# Patient Record
Sex: Male | Born: 1955 | Race: White | Hispanic: No | Marital: Married | Smoking: Former smoker
Health system: Southern US, Community
[De-identification: ages and names within clinical notes are randomized; demographics above are authoritative.]

## PROBLEM LIST (undated history)

## (undated) DIAGNOSIS — D500 Iron deficiency anemia secondary to blood loss (chronic): Secondary | ICD-10-CM

## (undated) DIAGNOSIS — D126 Benign neoplasm of colon, unspecified: Secondary | ICD-10-CM

## (undated) DIAGNOSIS — K259 Gastric ulcer, unspecified as acute or chronic, without hemorrhage or perforation: Secondary | ICD-10-CM

## (undated) DIAGNOSIS — E039 Hypothyroidism, unspecified: Secondary | ICD-10-CM

## (undated) DIAGNOSIS — I10 Essential (primary) hypertension: Secondary | ICD-10-CM

## (undated) DIAGNOSIS — Z972 Presence of dental prosthetic device (complete) (partial): Secondary | ICD-10-CM

## (undated) DIAGNOSIS — Z7189 Other specified counseling: Secondary | ICD-10-CM

## (undated) DIAGNOSIS — D649 Anemia, unspecified: Secondary | ICD-10-CM

## (undated) DIAGNOSIS — F419 Anxiety disorder, unspecified: Secondary | ICD-10-CM

## (undated) DIAGNOSIS — K219 Gastro-esophageal reflux disease without esophagitis: Secondary | ICD-10-CM

## (undated) DIAGNOSIS — Z973 Presence of spectacles and contact lenses: Secondary | ICD-10-CM

## (undated) DIAGNOSIS — K5790 Diverticulosis of intestine, part unspecified, without perforation or abscess without bleeding: Secondary | ICD-10-CM

## (undated) DIAGNOSIS — N200 Calculus of kidney: Secondary | ICD-10-CM

## (undated) DIAGNOSIS — K602 Anal fissure, unspecified: Secondary | ICD-10-CM

## (undated) DIAGNOSIS — Z974 Presence of external hearing-aid: Secondary | ICD-10-CM

## (undated) DIAGNOSIS — K5900 Constipation, unspecified: Secondary | ICD-10-CM

## (undated) DIAGNOSIS — F32A Depression, unspecified: Secondary | ICD-10-CM

## (undated) DIAGNOSIS — M1990 Unspecified osteoarthritis, unspecified site: Secondary | ICD-10-CM

## (undated) DIAGNOSIS — F329 Major depressive disorder, single episode, unspecified: Secondary | ICD-10-CM

## (undated) DIAGNOSIS — E785 Hyperlipidemia, unspecified: Secondary | ICD-10-CM

## (undated) DIAGNOSIS — H9190 Unspecified hearing loss, unspecified ear: Secondary | ICD-10-CM

## (undated) DIAGNOSIS — C165 Malignant neoplasm of lesser curvature of stomach, unspecified: Secondary | ICD-10-CM

## (undated) HISTORY — DX: Hypothyroidism, unspecified: E03.9

## (undated) HISTORY — DX: Other specified counseling: Z71.89

## (undated) HISTORY — PX: HERNIA REPAIR: SHX51

## (undated) HISTORY — DX: Anxiety disorder, unspecified: F41.9

## (undated) HISTORY — DX: Benign neoplasm of colon, unspecified: D12.6

## (undated) HISTORY — DX: Essential (primary) hypertension: I10

## (undated) HISTORY — DX: Unspecified osteoarthritis, unspecified site: M19.90

## (undated) HISTORY — DX: Calculus of kidney: N20.0

## (undated) HISTORY — DX: Depression, unspecified: F32.A

## (undated) HISTORY — DX: Diverticulosis of intestine, part unspecified, without perforation or abscess without bleeding: K57.90

## (undated) HISTORY — DX: Anemia, unspecified: D64.9

## (undated) HISTORY — DX: Gastro-esophageal reflux disease without esophagitis: K21.9

## (undated) HISTORY — DX: Constipation, unspecified: K59.00

## (undated) HISTORY — DX: Major depressive disorder, single episode, unspecified: F32.9

## (undated) HISTORY — DX: Gastric ulcer, unspecified as acute or chronic, without hemorrhage or perforation: K25.9

## (undated) HISTORY — DX: Hyperlipidemia, unspecified: E78.5

## (undated) HISTORY — DX: Anal fissure, unspecified: K60.2

## (undated) HISTORY — DX: Iron deficiency anemia secondary to blood loss (chronic): D50.0

## (undated) HISTORY — PX: COLONOSCOPY W/ BIOPSIES AND POLYPECTOMY: SHX1376

## (undated) HISTORY — DX: Malignant neoplasm of lesser curvature of stomach, unspecified: C16.5

---

## 1982-03-05 HISTORY — PX: APPENDECTOMY: SHX54

## 1999-11-22 ENCOUNTER — Ambulatory Visit (HOSPITAL_BASED_OUTPATIENT_CLINIC_OR_DEPARTMENT_OTHER): Admission: RE | Admit: 1999-11-22 | Discharge: 1999-11-22 | Payer: Self-pay | Admitting: Orthopedic Surgery

## 2004-02-09 ENCOUNTER — Encounter: Admission: RE | Admit: 2004-02-09 | Discharge: 2004-02-09 | Payer: Self-pay | Admitting: Gastroenterology

## 2006-02-04 ENCOUNTER — Encounter: Payer: Self-pay | Admitting: Gastroenterology

## 2006-02-05 ENCOUNTER — Encounter: Payer: Self-pay | Admitting: Gastroenterology

## 2006-03-05 HISTORY — PX: THUMB FUSION: SUR636

## 2006-03-05 HISTORY — PX: TRIGGER FINGER RELEASE: SHX641

## 2007-10-31 ENCOUNTER — Encounter: Admission: RE | Admit: 2007-10-31 | Discharge: 2007-10-31 | Payer: Self-pay | Admitting: General Surgery

## 2007-11-03 ENCOUNTER — Encounter (INDEPENDENT_AMBULATORY_CARE_PROVIDER_SITE_OTHER): Payer: Self-pay | Admitting: General Surgery

## 2007-11-03 ENCOUNTER — Ambulatory Visit (HOSPITAL_BASED_OUTPATIENT_CLINIC_OR_DEPARTMENT_OTHER): Admission: RE | Admit: 2007-11-03 | Discharge: 2007-11-03 | Payer: Self-pay | Admitting: General Surgery

## 2008-03-05 HISTORY — PX: ANAL FISSURE REPAIR: SHX2312

## 2008-03-11 ENCOUNTER — Ambulatory Visit: Payer: Self-pay | Admitting: Gastroenterology

## 2008-03-11 DIAGNOSIS — E785 Hyperlipidemia, unspecified: Secondary | ICD-10-CM | POA: Insufficient documentation

## 2008-03-11 DIAGNOSIS — N200 Calculus of kidney: Secondary | ICD-10-CM | POA: Insufficient documentation

## 2008-03-11 DIAGNOSIS — F329 Major depressive disorder, single episode, unspecified: Secondary | ICD-10-CM

## 2008-03-11 DIAGNOSIS — D126 Benign neoplasm of colon, unspecified: Secondary | ICD-10-CM | POA: Insufficient documentation

## 2008-03-11 DIAGNOSIS — D649 Anemia, unspecified: Secondary | ICD-10-CM

## 2008-03-11 DIAGNOSIS — N189 Chronic kidney disease, unspecified: Secondary | ICD-10-CM | POA: Insufficient documentation

## 2008-03-11 DIAGNOSIS — R079 Chest pain, unspecified: Secondary | ICD-10-CM | POA: Insufficient documentation

## 2008-03-11 DIAGNOSIS — I10 Essential (primary) hypertension: Secondary | ICD-10-CM | POA: Insufficient documentation

## 2008-03-11 DIAGNOSIS — K5730 Diverticulosis of large intestine without perforation or abscess without bleeding: Secondary | ICD-10-CM | POA: Insufficient documentation

## 2008-03-11 DIAGNOSIS — K602 Anal fissure, unspecified: Secondary | ICD-10-CM | POA: Insufficient documentation

## 2008-03-11 DIAGNOSIS — Z8739 Personal history of other diseases of the musculoskeletal system and connective tissue: Secondary | ICD-10-CM | POA: Insufficient documentation

## 2008-03-11 DIAGNOSIS — F411 Generalized anxiety disorder: Secondary | ICD-10-CM | POA: Insufficient documentation

## 2008-03-11 HISTORY — DX: Anemia, unspecified: D64.9

## 2008-03-16 ENCOUNTER — Ambulatory Visit (HOSPITAL_COMMUNITY): Admission: RE | Admit: 2008-03-16 | Discharge: 2008-03-16 | Payer: Self-pay | Admitting: Gastroenterology

## 2008-03-17 ENCOUNTER — Ambulatory Visit: Payer: Self-pay | Admitting: Gastroenterology

## 2008-03-17 ENCOUNTER — Encounter: Payer: Self-pay | Admitting: Gastroenterology

## 2008-03-17 LAB — CONVERTED CEMR LAB: UREASE: NEGATIVE

## 2008-03-18 LAB — CONVERTED CEMR LAB
ALT: 18 units/L (ref 0–53)
AST: 19 units/L (ref 0–37)
Albumin: 4.5 g/dL (ref 3.5–5.2)
Alkaline Phosphatase: 50 units/L (ref 39–117)
Amylase: 62 units/L (ref 27–131)
BUN: 28 mg/dL — ABNORMAL HIGH (ref 6–23)
Basophils Absolute: 0 10*3/uL (ref 0.0–0.1)
Basophils Relative: 1.2 % (ref 0.0–3.0)
Bilirubin, Direct: 0.1 mg/dL (ref 0.0–0.3)
CO2: 26 meq/L (ref 19–32)
Calcium: 10 mg/dL (ref 8.4–10.5)
Chloride: 104 meq/L (ref 96–112)
Creatinine, Ser: 1.4 mg/dL (ref 0.4–1.5)
Eosinophils Absolute: 0.1 10*3/uL (ref 0.0–0.7)
Eosinophils Relative: 3.6 % (ref 0.0–5.0)
GFR calc Af Amer: 68 mL/min
GFR calc non Af Amer: 56 mL/min
Glucose, Bld: 114 mg/dL — ABNORMAL HIGH (ref 70–99)
HCT: 35.1 % — ABNORMAL LOW (ref 39.0–52.0)
Hemoglobin: 12 g/dL — ABNORMAL LOW (ref 13.0–17.0)
Lipase: 37 units/L (ref 11.0–59.0)
Lymphocytes Relative: 41.2 % (ref 12.0–46.0)
MCHC: 34.3 g/dL (ref 30.0–36.0)
MCV: 87.3 fL (ref 78.0–100.0)
Monocytes Absolute: 0.3 10*3/uL (ref 0.1–1.0)
Monocytes Relative: 12.1 % — ABNORMAL HIGH (ref 3.0–12.0)
Neutro Abs: 1.2 10*3/uL — ABNORMAL LOW (ref 1.4–7.7)
Neutrophils Relative %: 41.9 % — ABNORMAL LOW (ref 43.0–77.0)
Platelets: 298 10*3/uL (ref 150–400)
Potassium: 4.5 meq/L (ref 3.5–5.1)
RBC: 4.02 M/uL — ABNORMAL LOW (ref 4.22–5.81)
RDW: 13.1 % (ref 11.5–14.6)
Sed Rate: 27 mm/hr — ABNORMAL HIGH (ref 0–16)
Sodium: 138 meq/L (ref 135–145)
TSH: 4.26 microintl units/mL (ref 0.35–5.50)
Total Bilirubin: 0.7 mg/dL (ref 0.3–1.2)
Total Protein: 8.1 g/dL (ref 6.0–8.3)
WBC: 2.8 10*3/uL — ABNORMAL LOW (ref 4.5–10.5)

## 2008-03-19 ENCOUNTER — Encounter: Payer: Self-pay | Admitting: Gastroenterology

## 2008-04-19 ENCOUNTER — Ambulatory Visit: Payer: Self-pay | Admitting: Gastroenterology

## 2008-04-19 LAB — CONVERTED CEMR LAB
Basophils Absolute: 0 10*3/uL (ref 0.0–0.1)
Basophils Relative: 1 % (ref 0.0–3.0)
Eosinophils Absolute: 0.1 10*3/uL (ref 0.0–0.7)
Eosinophils Relative: 3.3 % (ref 0.0–5.0)
Ferritin: 89.7 ng/mL (ref 22.0–322.0)
Folate: 12 ng/mL
HCT: 34.6 % — ABNORMAL LOW (ref 39.0–52.0)
Hemoglobin: 11.9 g/dL — ABNORMAL LOW (ref 13.0–17.0)
Iron: 72 ug/dL (ref 42–165)
Lymphocytes Relative: 38.5 % (ref 12.0–46.0)
MCHC: 34.4 g/dL (ref 30.0–36.0)
MCV: 88.2 fL (ref 78.0–100.0)
Monocytes Absolute: 0.4 10*3/uL (ref 0.1–1.0)
Monocytes Relative: 13.5 % — ABNORMAL HIGH (ref 3.0–12.0)
Neutro Abs: 1.3 10*3/uL — ABNORMAL LOW (ref 1.4–7.7)
Neutrophils Relative %: 43.7 % (ref 43.0–77.0)
Platelets: 318 10*3/uL (ref 150–400)
RBC: 3.93 M/uL — ABNORMAL LOW (ref 4.22–5.81)
RDW: 12.9 % (ref 11.5–14.6)
Saturation Ratios: 17.6 % — ABNORMAL LOW (ref 20.0–50.0)
Transferrin: 291.8 mg/dL (ref >=212.0)
Vitamin B-12: 723 pg/mL (ref 211–911)
WBC: 3 10*3/uL — ABNORMAL LOW (ref 4.5–10.5)

## 2008-05-17 ENCOUNTER — Ambulatory Visit: Payer: Self-pay | Admitting: Gastroenterology

## 2008-05-17 LAB — CONVERTED CEMR LAB
Basophils Absolute: 0.1 10*3/uL (ref 0.0–0.1)
Basophils Relative: 2.1 % (ref 0.0–3.0)
Eosinophils Absolute: 0.2 10*3/uL (ref 0.0–0.7)
Eosinophils Relative: 4.2 % (ref 0.0–5.0)
HCT: 34.1 % — ABNORMAL LOW (ref 39.0–52.0)
Hemoglobin: 11.4 g/dL — ABNORMAL LOW (ref 13.0–17.0)
Iron: 51 ug/dL (ref 42–165)
Lymphocytes Relative: 37.4 % (ref 12.0–46.0)
MCHC: 33.5 g/dL (ref 30.0–36.0)
MCV: 88 fL (ref 78.0–100.0)
Monocytes Absolute: 0.6 10*3/uL (ref 0.1–1.0)
Monocytes Relative: 15.1 % — ABNORMAL HIGH (ref 3.0–12.0)
Neutro Abs: 1.4 10*3/uL (ref 1.4–7.7)
Neutrophils Relative %: 41.2 % — ABNORMAL LOW (ref 43.0–77.0)
Platelets: 289 10*3/uL (ref 150–400)
RBC: 3.87 M/uL — ABNORMAL LOW (ref 4.22–5.81)
RDW: 13.3 % (ref 11.5–14.6)
Saturation Ratios: 12.6 % — ABNORMAL LOW (ref 20.0–50.0)
Transferrin: 289.8 mg/dL (ref 212.0–360.0)
WBC: 3.7 10*3/uL — ABNORMAL LOW (ref 4.5–10.5)

## 2008-05-24 ENCOUNTER — Telehealth: Payer: Self-pay | Admitting: Gastroenterology

## 2008-06-01 ENCOUNTER — Ambulatory Visit: Payer: Self-pay | Admitting: Gastroenterology

## 2008-06-01 LAB — CONVERTED CEMR LAB: Fecal Occult Bld: POSITIVE

## 2008-06-07 ENCOUNTER — Ambulatory Visit: Payer: Self-pay | Admitting: Gastroenterology

## 2008-06-16 ENCOUNTER — Ambulatory Visit: Payer: Self-pay | Admitting: Gastroenterology

## 2008-06-29 ENCOUNTER — Ambulatory Visit: Payer: Self-pay | Admitting: Gastroenterology

## 2008-06-29 DIAGNOSIS — K2940 Chronic atrophic gastritis without bleeding: Secondary | ICD-10-CM | POA: Insufficient documentation

## 2008-06-29 LAB — CONVERTED CEMR LAB
Basophils Absolute: 0 10*3/uL (ref 0.0–0.1)
Basophils Relative: 0.9 % (ref 0.0–3.0)
Eosinophils Absolute: 0.1 10*3/uL (ref 0.0–0.7)
Eosinophils Relative: 4 % (ref 0.0–5.0)
Ferritin: 56.4 ng/mL (ref 22.0–322.0)
Folate: >20 ng/mL
HCT: 31.3 % — ABNORMAL LOW (ref 39.0–52.0)
Hemoglobin: 10.8 g/dL — ABNORMAL LOW (ref 13.0–17.0)
Iron: 66 ug/dL (ref 42–165)
Lymphocytes Relative: 38.8 % (ref 12.0–46.0)
Lymphs Abs: 1.3 10*3/uL (ref 0.7–4.0)
MCHC: 34.5 g/dL (ref 30.0–36.0)
MCV: 88 fL (ref 78.0–100.0)
Monocytes Absolute: 0.5 10*3/uL (ref 0.1–1.0)
Monocytes Relative: 15.9 % — ABNORMAL HIGH (ref 3.0–12.0)
Neutro Abs: 1.5 10*3/uL (ref 1.4–7.7)
Neutrophils Relative %: 40.4 % — ABNORMAL LOW (ref 43.0–77.0)
Platelets: 308 10*3/uL (ref 150.0–400.0)
RBC: 3.56 M/uL — ABNORMAL LOW (ref 4.22–5.81)
RDW: 13.3 % (ref 11.5–14.6)
Saturation Ratios: 19.3 % — ABNORMAL LOW (ref 20.0–50.0)
Transferrin: 244.5 mg/dL (ref 212.0–360.0)
Vitamin B-12: >1500 pg/mL — ABNORMAL HIGH (ref 211–911)
WBC: 3.4 10*3/uL — ABNORMAL LOW (ref 4.5–10.5)

## 2008-07-07 ENCOUNTER — Ambulatory Visit: Payer: Self-pay | Admitting: Gastroenterology

## 2008-07-21 ENCOUNTER — Telehealth (INDEPENDENT_AMBULATORY_CARE_PROVIDER_SITE_OTHER): Payer: Self-pay | Admitting: *Deleted

## 2008-07-30 ENCOUNTER — Ambulatory Visit: Payer: Self-pay | Admitting: Gastroenterology

## 2008-08-03 ENCOUNTER — Telehealth: Payer: Self-pay | Admitting: Gastroenterology

## 2008-08-12 ENCOUNTER — Encounter: Payer: Self-pay | Admitting: Gastroenterology

## 2008-08-12 ENCOUNTER — Ambulatory Visit: Payer: Self-pay | Admitting: Internal Medicine

## 2008-08-24 ENCOUNTER — Telehealth: Payer: Self-pay | Admitting: Gastroenterology

## 2008-08-30 ENCOUNTER — Telehealth: Payer: Self-pay | Admitting: Gastroenterology

## 2008-08-31 ENCOUNTER — Telehealth: Payer: Self-pay | Admitting: Gastroenterology

## 2008-09-28 ENCOUNTER — Ambulatory Visit: Payer: Self-pay | Admitting: Gastroenterology

## 2008-09-28 ENCOUNTER — Encounter: Admission: RE | Admit: 2008-09-28 | Discharge: 2008-09-28 | Payer: Self-pay | Admitting: Family Medicine

## 2008-10-04 LAB — CONVERTED CEMR LAB
Basophils Absolute: 0.1 10*3/uL
Basophils Relative: 2.3 %
Eosinophils Absolute: 0.1 10*3/uL
Eosinophils Relative: 2.8 %
HCT: 35.1 % — ABNORMAL LOW
Hemoglobin: 11.9 g/dL — ABNORMAL LOW
Lymphocytes Relative: 36 %
Lymphs Abs: 1.3 10*3/uL
MCHC: 33.9 g/dL
MCV: 87.1 fL
Monocytes Absolute: 0.4 10*3/uL
Monocytes Relative: 11.9 %
Neutro Abs: 1.6 10*3/uL
Neutrophils Relative %: 47 %
Platelets: 288 10*3/uL
RBC: 4.03 M/uL — ABNORMAL LOW
RDW: 13.2 %
WBC: 3.5 10*3/uL — ABNORMAL LOW

## 2009-10-06 ENCOUNTER — Telehealth (INDEPENDENT_AMBULATORY_CARE_PROVIDER_SITE_OTHER): Payer: Self-pay | Admitting: *Deleted

## 2010-04-04 NOTE — Progress Notes (Signed)
  Phone Note Other Incoming   Request: Send information Action Taken: Software engineer of Call: Request for records received from [employer]. Request forwarded to [employer].

## 2010-07-18 NOTE — Op Note (Signed)
NAME:  Steven Ortega, Steven Ortega NO.:  [id number]   MEDICAL RECORD NO.:  [id number]          PATIENT TYPE:  AMB   LOCATION:  DSC                          FACILITY:  [facility]   PHYSICIAN:  Cherylynn Ridges, M.D.    DATE OF BIRTH:  12-11-1955   DATE OF PROCEDURE:  11/03/2007  DATE OF DISCHARGE:                               OPERATIVE REPORT   PREOPERATIVE DIAGNOSIS:  Anal fissure with sentinel tag.   POSTOPERATIVE DIAGNOSIS:  Anal fissure with sentinel tag.   PROCEDURE:  1. Examination under anesthesia.  2. Hemorrhoidectomy.  3. Fissurectomy.  4. Right lateral sphincterotomy.   SURGEON:  Cherylynn Ridges, MD   ANESTHESIA:  General with a laryngeal airway.   ESTIMATED BLOOD LOSS:  Less than 50 mL.   COMPLICATIONS:  None.   CONDITION:  Stable.   INDICATIONS FOR OPERATION:  The patient is a 55 year old with pain,  bleeding from a known sentinel tag, and fissure, now comes in for EUA  and fissurectomy operation.   FINDINGS AT THE TIME OF SURGERY:  The patient is found to have a left  lateral anal fissure associated with a sentinel tag and a hemorrhoid in  internal position at approximately the 4 o'clock position with 12  o'clock being directly anterior in the lithotomy position.   OPERATION:  The patient was taken to the operating room and placed on  table in supine position.  After an adequate general laryngeal airway  anesthetic was administered, he was actually placed on lithotomy  position and then prepped and draped in usual sterile manner exposing  the perianal and anal area.   Using an anal speculum, the area of the left lateral sentinel tag and  fissure was identified.  Just above the fissure was a large hemorrhoid.  At the base of the hemorrhoid, a 3-0 chromic stitch was placed using SH-  1 needle.  We then excised the hemorrhoid, the mucosa just outside of  the fissure and also the sentinel tag in one-piece and closed up the  area using running locking stitch  with 3-0 chromic.  Once this was done,  we used a second stitch in order to control bleeding at the level of the  internal hemorrhoid.  A figure-of-eight stitch of 3-0 chromic was done.  There is a posterior tear of the mucosa through which we used in order  to access the sphincter on the right lateral side.  We  accessed the sphincter muscle, got underneath with a Kelly clamp, and  then cut across using electrocautery.  We then reapproximated the mucosa  in the midline posteriorly using a running locking stitch of 3-0  chromic.  Dibucaine ointment with Gelfoam was placed in the wound.  There was minimal bleeding.  All counts were correct.      Cherylynn Ridges, M.D.  Electronically Signed     JOW/MEDQ  D:  11/03/2007  T:  11/04/2007  Job:  [id number]

## 2011-03-06 HISTORY — PX: LUMBAR FUSION: SHX111

## 2013-07-29 ENCOUNTER — Encounter: Payer: Self-pay | Admitting: Internal Medicine

## 2013-08-05 ENCOUNTER — Encounter: Payer: Self-pay | Admitting: Internal Medicine

## 2013-10-08 ENCOUNTER — Telehealth: Payer: Self-pay | Admitting: *Deleted

## 2013-10-08 ENCOUNTER — Ambulatory Visit (AMBULATORY_SURGERY_CENTER): Payer: Medicare Other | Admitting: *Deleted

## 2013-10-08 VITALS — Ht 72.0 in | Wt 204.0 lb

## 2013-10-08 DIAGNOSIS — Z86010 Personal history of colonic polyps: Secondary | ICD-10-CM

## 2013-10-08 MED ORDER — NA SULFATE-K SULFATE-MG SULF 17.5-3.13-1.6 GM/177ML PO SOLN: ORAL | Status: DC

## 2013-10-08 NOTE — Telephone Encounter (Signed)
noted 

## 2013-10-08 NOTE — Telephone Encounter (Signed)
We can let Steven Ortega decide if we ned to postpone the colonoscopy

## 2013-10-08 NOTE — Progress Notes (Signed)
No allergies to eggs or soy. No problems with anesthesia.  Pt given Emmi instructions for colonoscopy  No oxygen use  No diet drug use  During PV pt says treated for diverticulitis late June.  Symptoms have returned and pt requesting OV.  New pt appt scheduled with Alonza Bogus 8/11.  Colonoscopy not cancelled.  Phone note sent to Dr. Carlean Purl.

## 2013-10-08 NOTE — Telephone Encounter (Signed)
Dr Carlean Purl; pt is scheduled for recall colonoscopy 10/22/13.  During PV pt says he was treated for diverticulitis in late June by MD at [facility] (pt does not know name of MD).  He says symptoms have returned and he is requesting OV.  I have scheduled him for New Pt appt with Alonza Bogus on Tuesday 8/11 and have not cancelled colonoscopy scheduled for 8/20.  Please let me know if I need to make changes.  Thanks, Juliann Pulse

## 2013-10-13 ENCOUNTER — Encounter: Payer: Self-pay | Admitting: Gastroenterology

## 2013-10-13 ENCOUNTER — Other Ambulatory Visit (INDEPENDENT_AMBULATORY_CARE_PROVIDER_SITE_OTHER): Payer: Medicare Other

## 2013-10-13 ENCOUNTER — Ambulatory Visit (INDEPENDENT_AMBULATORY_CARE_PROVIDER_SITE_OTHER): Payer: Medicare Other | Admitting: Gastroenterology

## 2013-10-13 VITALS — BP 110/74 | HR 96 | Ht 71.0 in | Wt 207.0 lb

## 2013-10-13 DIAGNOSIS — R1032 Left lower quadrant pain: Secondary | ICD-10-CM

## 2013-10-13 DIAGNOSIS — K5732 Diverticulitis of large intestine without perforation or abscess without bleeding: Secondary | ICD-10-CM

## 2013-10-13 LAB — BASIC METABOLIC PANEL
BUN: 20 mg/dL (ref 6–23)
CHLORIDE: 103 meq/L (ref 96–112)
CO2: 22 meq/L (ref 19–32)
Calcium: 10.1 mg/dL (ref 8.4–10.5)
Creatinine, Ser: 1.5 mg/dL (ref 0.4–1.5)
GFR: 51.13 mL/min — ABNORMAL LOW (ref >60.00)
Glucose, Bld: 100 mg/dL — ABNORMAL HIGH (ref 70–99)
POTASSIUM: 4 meq/L (ref 3.5–5.1)
Sodium: 136 mEq/L (ref 135–145)

## 2013-10-13 NOTE — Progress Notes (Signed)
     10/13/2013 Steven Ortega [id number] 01-Jan-1956   History of Present Illness:  This is a 58 year old male who is previously known to Dr. Sharlett Iles.  Had colonoscopy in 06/2008 at which time he had moderate diverticulosis but was otherwise normal.  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  He is scheduled for a colonoscopy on 8/20 with Dr. Carlean Purl for surveillance.  He has a history of diverticulitis.  Was treated for presumed diverticulitis in June by his PCP when he complained of left sided abdominal pain.  He says that the pain has improved, but never really went away.  Pain is on the left side (left mid-abdomen to LLQ).  Denies any nausea, vomiting, fevers, or chills.  Appetite is good.  Takes Miralax and stool softeners for constipation.   Current Medications, Allergies, Past Medical History, Past Surgical History, Family History and Social History were reviewed in [employer] record.   Physical Exam: BP 110/74  Pulse 96  Ht 5\' 11"  (1.803 m)  Wt 207 lb (93.895 kg)  BMI 28.88 kg/m2 General: Well developed white male in no acute distress Head: Normocephalic and atraumatic Eyes:  Sclerae anicteric, conjunctiva pink  Ears: Normal auditory acuity Lungs: Clear throughout to auscultation Heart: Regular rate and rhythm Abdomen: Soft, non-distended.  Normal bowel sounds.  Mild LLQ abdominal TTP without R/R/G. Rectal:  Deferred.  Will be done at the time of colonoscopy. Musculoskeletal: Symmetrical with no gross deformities  Extremities: No edema  Neurological: Alert oriented x 4, grossly non-focal Psychological:  Alert and cooperative. Normal mood and affect  Assessment and Recommendations: -Left-sided abdominal pain with history of diverticulitis:  Will schedule CT scan of the abdomen and pelvis with contrast.  Check BMP today.  If CT scan ok and negative for diverticulitis then will proceed with colonoscopy next week as already  scheduled.

## 2013-10-13 NOTE — Patient Instructions (Signed)
Your physician has requested that you go to the basement for the following lab work before leaving today: BMET __________________________________________________________________________________________________ Steven Ortega have been scheduled for a CT scan of the abdomen and pelvis at [facility] ([address]---this is in the same building as Press [facility]).   You are scheduled on 10-15-2013 at 230 PM. You should arrive 15 minutes prior to your appointment time for registration. Please follow the written instructions below on the day of your exam:  WARNING: IF YOU ARE ALLERGIC TO IODINE/X-RAY DYE, PLEASE NOTIFY RADIOLOGY IMMEDIATELY AT [phone]! YOU WILL BE GIVEN A 13 HOUR PREMEDICATION PREP.  1) Do not eat or drink anything after 1030 AM (4 hours prior to your test) 2) You have been given 2 bottles of oral contrast to drink. The solution may taste better if refrigerated, but do NOT add ice or any other liquid to this solution. Shake well before drinking.    Drink 1 bottle of contrast @ 1230 PM (2 hours prior to your exam)  Drink 1 bottle of contrast @ 1:30 PM (1 hour prior to your exam)  You may take any medications as prescribed with a small amount of water except for the following: Metformin, Glucophage, Glucovance, Avandamet, Riomet, Fortamet, Actoplus Met, Janumet, Glumetza or Metaglip. The above medications must be held the day of the exam AND 48 hours after the exam.  The purpose of you drinking the oral contrast is to aid in the visualization of your intestinal tract. The contrast solution may cause some diarrhea. Before your exam is started, you will be given a small amount of fluid to drink. Depending on your individual set of symptoms, you may also receive an intravenous injection of x-ray contrast/dye. Plan on being at [facility] for 30 minutes or long, depending on the type of exam you are having performed.  This test typically takes 30-45 minutes to  complete.  If you have any questions regarding your exam or if you need to reschedule, you may call the CT department at [phone] between the hours of 8:00 am and 5:00 pm, Monday-Friday.  ________________________________________________________________________

## 2013-10-14 NOTE — Progress Notes (Signed)
Agree with Ms. Zehr's management.  Carl E. Gessner, MD, FACG  

## 2013-10-15 ENCOUNTER — Ambulatory Visit (INDEPENDENT_AMBULATORY_CARE_PROVIDER_SITE_OTHER)
Admission: RE | Admit: 2013-10-15 | Discharge: 2013-10-15 | Disposition: A | Discharge status: Home / Self Care | Payer: Medicare Other | Source: Ambulatory Visit | Attending: Gastroenterology | Admitting: Gastroenterology

## 2013-10-15 DIAGNOSIS — R1032 Left lower quadrant pain: Secondary | ICD-10-CM

## 2013-10-15 DIAGNOSIS — K5732 Diverticulitis of large intestine without perforation or abscess without bleeding: Secondary | ICD-10-CM

## 2013-10-15 MED ORDER — IOHEXOL 300 MG/ML  SOLN
100.0000 mL | Freq: Once | INTRAMUSCULAR | Status: AC | PRN
  Administered 2013-10-15: 100 mL via INTRAVENOUS

## 2013-10-22 ENCOUNTER — Ambulatory Visit (AMBULATORY_SURGERY_CENTER): Payer: Medicare Other | Admitting: Internal Medicine

## 2013-10-22 ENCOUNTER — Encounter: Payer: Self-pay | Admitting: Internal Medicine

## 2013-10-22 VITALS — BP 112/74 | HR 53 | Temp 97.8°F | Resp 15 | Ht 71.0 in | Wt 207.0 lb

## 2013-10-22 DIAGNOSIS — Z86010 Personal history of colonic polyps: Secondary | ICD-10-CM

## 2013-10-22 DIAGNOSIS — K5730 Diverticulosis of large intestine without perforation or abscess without bleeding: Secondary | ICD-10-CM

## 2013-10-22 DIAGNOSIS — R1032 Left lower quadrant pain: Secondary | ICD-10-CM

## 2013-10-22 MED ORDER — SODIUM CHLORIDE 0.9 % IV SOLN: 500.0000 mL | INTRAVENOUS | Status: DC

## 2013-10-22 NOTE — Patient Instructions (Addendum)
No polyps today!  You do have  Diverticulosis but no diverticulitis now. Please read the handout provided.  Next routine colonoscopy in 10 years - 2025  I appreciate the opportunity to care for you. Gatha Mayer, MD, FACG  YOU HAD AN ENDOSCOPIC PROCEDURE TODAY AT [facility]: Refer to the procedure report that was given to you for any specific questions about what was found during the examination.  If the procedure report does not answer your questions, please call your gastroenterologist to clarify.  If you requested that your care partner not be given the details of your procedure findings, then the procedure report has been included in a sealed envelope for you to review at your convenience later.  YOU SHOULD EXPECT: Some feelings of bloating in the abdomen. Passage of more gas than usual.  Walking can help get rid of the air that was put into your GI tract during the procedure and reduce the bloating. If you had a lower endoscopy (such as a colonoscopy or flexible sigmoidoscopy) you may notice spotting of blood in your stool or on the toilet paper. If you underwent a bowel prep for your procedure, then you may not have a normal bowel movement for a few days.  DIET: Your first meal following the procedure should be a light meal and then it is ok to progress to your normal diet.  A half-sandwich or bowl of soup is an example of a good first meal.  Heavy or fried foods are harder to digest and may make you feel nauseous or bloated.  Likewise meals heavy in dairy and vegetables can cause extra gas to form and this can also increase the bloating.  Drink plenty of fluids but you should avoid alcoholic beverages for 24 hours.  ACTIVITY: Your care partner should take you home directly after the procedure.  You should plan to take it easy, moving slowly for the rest of the day.  You can resume normal activity the day after the procedure however you should NOT DRIVE or use heavy  machinery for 24 hours (because of the sedation medicines used during the test).    SYMPTOMS TO REPORT IMMEDIATELY: A gastroenterologist can be reached at any hour.  During normal business hours, 8:30 AM to 5:00 PM Monday through Friday, call [phone].  After hours and on weekends, please call the GI answering service at [phone] who will take a message and have the physician on call contact you.   Following lower endoscopy (colonoscopy or flexible sigmoidoscopy):  Excessive amounts of blood in the stool  Significant tenderness or worsening of abdominal pains  Swelling of the abdomen that is new, acute  Fever of 100F or higher   FOLLOW UP: If any biopsies were taken you will be contacted by phone or by letter within the next 1-3 weeks.  Call your gastroenterologist if you have not heard about the biopsies in 3 weeks.  Our staff will call the home number listed on your records the next business day following your procedure to check on you and address any questions or concerns that you may have at that time regarding the information given to you following your procedure. This is a courtesy call and so if there is no answer at the home number and we have not heard from you through the emergency physician on call, we will assume that you have returned to your regular daily activities without incident.  SIGNATURES/CONFIDENTIALITY: You and/or your care partner  have signed paperwork which will be entered into your electronic medical record.  These signatures attest to the fact that that the information above on your After Visit Summary has been reviewed and is understood.  Full responsibility of the confidentiality of this discharge information lies with you and/or your care-partner.

## 2013-10-22 NOTE — Op Note (Signed)
[place]  [address] [place], [address]   COLONOSCOPY PROCEDURE REPORT  PATIENT: Steven Ortega, Steven Ortega  MR#: [id number] BIRTHDATE: Feb 19, 1956 , 58  yrs. old GENDER: Male ENDOSCOPIST: Gatha Mayer, MD, [facility] PROCEDURE DATE:  10/22/2013 PROCEDURE:   Colonoscopy, surveillance First Screening Colonoscopy - Avg.  risk and is 50 yrs.  old or older - No.  Prior Negative Screening - Now for repeat screening. N/A  History of Adenoma - Now for follow-up colonoscopy & has been > or = to 3 yrs.  Yes hx of adenoma.  Has been 3 or more years since last colonoscopy.  Polyps Removed Today? No.  Recommend repeat exam, <10 yrs? No. ASA CLASS:   Class II INDICATIONS:Patient's personal history of adenomatous colon polyps.  MEDICATIONS: propofol (Diprivan) 250mg  IV, MAC sedation, administered by CRNA, and These medications were titrated to patient response per physician's verbal order  DESCRIPTION OF PROCEDURE:   After the risks benefits and alternatives of the procedure were thoroughly explained, informed consent was obtained.  A digital rectal exam revealed no abnormalities of the rectum, A digital rectal exam revealed the prostate was not enlarged, and A digital rectal exam revealed no prostatic nodules.   The LB [id number]  endoscope was introduced through the anus and advanced to the cecum, which was identified by both the appendix and ileocecal valve. No adverse events experienced.   The quality of the prep was excellent using Suprep  The instrument was then slowly withdrawn as the colon was fully examined.   COLON FINDINGS: Severe diverticulosis was noted in the sigmoid colon.   The colon mucosa was otherwise normal except for some melanosis   A right colon retroflexion was performed.  Retroflexed views revealed no abnormalities. The time to cecum=3 minutes 05 seconds.  Withdrawal time=6 minutes 04 seconds.  The scope was withdrawn and the procedure  completed. COMPLICATIONS: There were no complications.  ENDOSCOPIC IMPRESSION: 1.   Severe diverticulosis was noted in the sigmoid colon 2.   The colon mucosa was otherwise normal except for some melanosis  RECOMMENDATIONS: 1.  Repeat colonoscopy 10 years. 2.   Remote hx polyps - none today and none in 2010.   eSigned:  Gatha Mayer, MD, [facility] 10/22/2013 9:45 AM   cc: Juanita Craver, MD and The Patient

## 2013-10-22 NOTE — Progress Notes (Signed)
Report to PACU, RN, vss, BBS= Clear.  

## 2013-10-23 ENCOUNTER — Telehealth: Payer: Self-pay | Admitting: *Deleted

## 2013-10-23 NOTE — Telephone Encounter (Signed)
  Follow up Call-  Call back number 10/22/2013  Post procedure Call Back phone  # [phone] (leave message here)  or [phone]  Permission to leave phone message Yes     Patient questions:  Do you have a fever, pain , or abdominal swelling? No. Pain Score  0 *  Have you tolerated food without any problems? Yes.    Have you been able to return to your normal activities? Yes.    Do you have any questions about your discharge instructions: Diet   No. Medications  No. Follow up visit  No.  Do you have questions or concerns about your Care? No.  Actions: * If pain score is 4 or above: No action needed, pain <4.

## 2014-09-15 ENCOUNTER — Encounter: Payer: Self-pay | Admitting: Gastroenterology

## 2015-03-30 ENCOUNTER — Other Ambulatory Visit: Payer: Self-pay | Admitting: Gastroenterology

## 2015-03-30 DIAGNOSIS — R1032 Left lower quadrant pain: Secondary | ICD-10-CM

## 2015-04-04 ENCOUNTER — Ambulatory Visit
Admission: RE | Admit: 2015-04-04 | Discharge: 2015-04-04 | Disposition: A | Discharge status: Home / Self Care | Payer: Medicare Other | Source: Ambulatory Visit | Attending: Gastroenterology | Admitting: Gastroenterology

## 2015-04-04 DIAGNOSIS — R1032 Left lower quadrant pain: Secondary | ICD-10-CM

## 2015-04-04 MED ORDER — IOPAMIDOL (ISOVUE-300) INJECTION 61%
125.0000 mL | Freq: Once | INTRAVENOUS | Status: AC | PRN
  Administered 2015-04-04: 125 mL via INTRAVENOUS

## 2015-09-18 ENCOUNTER — Emergency Department (HOSPITAL_COMMUNITY): Payer: Medicare Other

## 2015-09-18 ENCOUNTER — Encounter (HOSPITAL_COMMUNITY): Payer: Self-pay | Admitting: Emergency Medicine

## 2015-09-18 ENCOUNTER — Emergency Department (HOSPITAL_COMMUNITY)
Admission: EM | Admit: 2015-09-18 | Discharge: 2015-09-18 | Disposition: A | Discharge status: Home / Self Care | Payer: Medicare Other | Attending: Emergency Medicine | Admitting: Emergency Medicine

## 2015-09-18 DIAGNOSIS — Z79899 Other long term (current) drug therapy: Secondary | ICD-10-CM | POA: Insufficient documentation

## 2015-09-18 DIAGNOSIS — R109 Unspecified abdominal pain: Secondary | ICD-10-CM | POA: Diagnosis present

## 2015-09-18 DIAGNOSIS — N200 Calculus of kidney: Secondary | ICD-10-CM | POA: Insufficient documentation

## 2015-09-18 DIAGNOSIS — E785 Hyperlipidemia, unspecified: Secondary | ICD-10-CM | POA: Insufficient documentation

## 2015-09-18 DIAGNOSIS — I10 Essential (primary) hypertension: Secondary | ICD-10-CM | POA: Insufficient documentation

## 2015-09-18 DIAGNOSIS — Z87891 Personal history of nicotine dependence: Secondary | ICD-10-CM | POA: Diagnosis not present

## 2015-09-18 LAB — URINALYSIS, ROUTINE W REFLEX MICROSCOPIC
Bilirubin Urine: NEGATIVE
GLUCOSE, UA: NEGATIVE mg/dL
Hgb urine dipstick: NEGATIVE
KETONES UR: NEGATIVE mg/dL
Leukocytes, UA: NEGATIVE
Nitrite: NEGATIVE
PH: 6 (ref 5.0–8.0)
Protein, ur: NEGATIVE mg/dL
Specific Gravity, Urine: 1.024 (ref 1.005–1.030)

## 2015-09-18 LAB — I-STAT CHEM 8, ED
BUN: 18 mg/dL (ref 6–20)
Calcium, Ion: 1.27 mmol/L — ABNORMAL HIGH (ref 1.12–1.23)
Chloride: 104 mmol/L (ref 101–111)
Creatinine, Ser: 1.3 mg/dL — ABNORMAL HIGH (ref 0.61–1.24)
Glucose, Bld: 106 mg/dL — ABNORMAL HIGH (ref 65–99)
HCT: 40 % (ref 39.0–52.0)
Hemoglobin: 13.6 g/dL (ref 13.0–17.0)
Potassium: 4.1 mmol/L (ref 3.5–5.1)
Sodium: 142 mmol/L (ref 135–145)
TCO2: 25 mmol/L (ref 0–100)

## 2015-09-18 MED ORDER — ONDANSETRON HCL 4 MG/2ML IJ SOLN
4.0000 mg | Freq: Once | INTRAMUSCULAR | Status: AC
  Administered 2015-09-18: 4 mg via INTRAVENOUS
  Filled 2015-09-18: qty 2

## 2015-09-18 MED ORDER — TAMSULOSIN HCL 0.4 MG PO CAPS: 0.4000 mg | ORAL_CAPSULE | Freq: Every day | ORAL | Status: AC

## 2015-09-18 MED ORDER — KETOROLAC TROMETHAMINE 15 MG/ML IJ SOLN
15.0000 mg | Freq: Once | INTRAMUSCULAR | Status: AC
  Administered 2015-09-18: 15 mg via INTRAVENOUS
  Filled 2015-09-18: qty 1

## 2015-09-18 MED ORDER — FENTANYL CITRATE (PF) 100 MCG/2ML IJ SOLN
50.0000 ug | Freq: Once | INTRAMUSCULAR | Status: AC
  Administered 2015-09-18: 50 ug via INTRAVENOUS
  Filled 2015-09-18: qty 2

## 2015-09-18 MED ORDER — TAMSULOSIN HCL 0.4 MG PO CAPS
0.4000 mg | ORAL_CAPSULE | Freq: Once | ORAL | Status: AC
  Administered 2015-09-18: 0.4 mg via ORAL
  Filled 2015-09-18: qty 1

## 2015-09-18 MED ORDER — SODIUM CHLORIDE 0.9 % IV BOLUS (SEPSIS)
1000.0000 mL | Freq: Once | INTRAVENOUS | Status: AC
  Administered 2015-09-18: 1000 mL via INTRAVENOUS

## 2015-09-18 MED ORDER — HYDROCODONE-ACETAMINOPHEN 5-325 MG PO TABS: 1.0000 | ORAL_TABLET | ORAL | Status: DC | PRN

## 2015-09-18 MED ORDER — MORPHINE SULFATE (PF) 4 MG/ML IV SOLN
4.0000 mg | Freq: Once | INTRAVENOUS | Status: AC
  Administered 2015-09-18: 4 mg via INTRAVENOUS
  Filled 2015-09-18: qty 1

## 2015-09-18 NOTE — ED Notes (Signed)
Pt states flank pain started around 0300. Hx of stones. Know stones on that side. Pt states feels like the same

## 2015-09-18 NOTE — ED Notes (Signed)
Pt aware of urine sample needed, pt unable to go at this time. Pt will try

## 2015-09-18 NOTE — ED Provider Notes (Signed)
CSN: [id number]     Arrival date & time 09/18/15  [id number] History   First MD Initiated Contact with Patient 09/18/15 [phone]     Chief Complaint  Patient presents with  . Flank Pain    HPI    60 year old male presents today with left flank pain. Patient reports symptoms started at approximately 3 AM this morning with left flank and groin pain. Patient reports today's presentation is identical to previous kidney stones with the exception he is having more pain in his flank. Patient notes that he was able to urinate this morning around 3 AM, but did not have the light on and was unable to see his urine. Patient reports he was feeling fine leading up to today's events. Patient reports the pain radiates from the left flank down the left side of the abdomen and into his testicles. Patient denies any nausea or vomiting, fever or chills, preceding dysuria. He denies any chest pain or shortness of breath.  Past Medical History  Diagnosis Date  . Hypertension   . Hyperlipidemia   . GERD (gastroesophageal reflux disease)   . Constipation   . Arthritis   . Anemia   . Anxiety   . Depression   . Kidney stones   . Hypothyroidism   . Anal fissure   . Diverticulosis   . Kidney stones   . Pyloric erosion   . Adenomatous colon polyp    Past Surgical History  Procedure Laterality Date  . Trigger finger release Left 2008    2nd finger  . Thumb fusion Left 2008  . Thumb fusion Right 2008    x 2  . Appendectomy  1984  . Anal fissure repair  2010  . Lumbar fusion  2013   Family History  Problem Relation Age of Onset  . Colon cancer Neg Hx   . Heart attack Brother     x 2  . Heart attack Father   . Lung cancer Father    Social History  Substance Use Topics  . Smoking status: Former Smoker    Types: Cigarettes    Quit date: 03/05/1998  . Smokeless tobacco: Never Used  . Alcohol Use: 0.6 oz/week    1 Cans of beer per week     Comment: occasional    Review of Systems  All other systems  reviewed and are negative.     Allergies  Review of patient's allergies indicates no known allergies.  Home Medications   Prior to Admission medications   Medication Sig Start Date End Date Taking? Authorizing Provider  acyclovir (ZOVIRAX) 800 MG tablet Take 400 mg by mouth daily.    Yes Historical Provider, MD  ALPRAZolam Duanne Moron) 1 MG tablet Take 1 mg by mouth 3 (three) times daily as needed for anxiety.   Yes Historical Provider, MD  buPROPion (ZYBAN) 150 MG 12 hr tablet Take 150 mg by mouth 2 (two) times daily.   Yes Historical Provider, MD  Cholecalciferol 2000 UNITS CAPS Take 2,000 Units by mouth daily.    Yes Historical Provider, MD  citalopram (CELEXA) 40 MG tablet Take 40 mg by mouth daily.    Yes Historical Provider, MD  cyanocobalamin 500 MCG tablet Take 500 mcg by mouth 4 (four) times daily.    Yes Historical Provider, MD  cyclobenzaprine (FLEXERIL) 10 MG tablet Take 10 mg by mouth at bedtime.   Yes Historical Provider, MD  dicyclomine (BENTYL) 10 MG capsule Take 10 mg by mouth 4 (four) times daily -  before meals and at bedtime.   Yes Historical Provider, MD  docusate sodium (STOOL SOFTENER) 100 MG capsule Take 100 mg by mouth 4 (four) times daily.    Yes Historical Provider, MD  ferrous sulfate 325 (65 FE) MG tablet Take 325 mg by mouth daily with breakfast.   Yes Historical Provider, MD  gemfibrozil (LOPID) 600 MG tablet Take 600 mg by mouth 2 (two) times daily before a meal.   Yes Historical Provider, MD  hydrochlorothiazide (HYDRODIURIL) 25 MG tablet Take 25 mg by mouth daily.   Yes Historical Provider, MD  levothyroxine (SYNTHROID, LEVOTHROID) 25 MCG tablet Take 25 mcg by mouth daily before breakfast.   Yes Historical Provider, MD  Omega-3 Fatty Acids (FISH OIL) 1000 MG CAPS Take 1,000 mg by mouth 2 (two) times daily.    Yes Historical Provider, MD  omeprazole (PRILOSEC) 20 MG capsule Take 20 mg by mouth daily.   Yes Historical Provider, MD  polyethylene glycol (MIRALAX /  GLYCOLAX) packet Take 17 g by mouth daily.   Yes Historical Provider, MD  sodium bicarbonate 325 MG tablet Take 325 mg by mouth 2 (two) times daily.   Yes Historical Provider, MD  traMADol (ULTRAM) 50 MG tablet Take 50 mg by mouth every 6 (six) hours as needed for moderate pain.   Yes Historical Provider, MD  HYDROcodone-acetaminophen (NORCO/VICODIN) 5-325 MG tablet Take 1 tablet by mouth every 4 (four) hours as needed. 09/18/15   Okey Regal, PA-C  tamsulosin (FLOMAX) 0.4 MG CAPS capsule Take 1 capsule (0.4 mg total) by mouth daily. 09/18/15   Jameer Storie, PA-C   BP 122/65 mmHg  Pulse 63  Temp(Src) 98.7 F (37.1 C) (Oral)  Resp 18  Ht 6' (1.829 m)  Wt 92.987 kg  BMI 27.80 kg/m2  SpO2 95%   Physical Exam  Constitutional: He is oriented to person, place, and time. He appears well-developed and well-nourished. He appears distressed.  HENT:  Head: Normocephalic and atraumatic.  Eyes: Conjunctivae are normal. Pupils are equal, round, and reactive to light. Right eye exhibits no discharge (hedgmdm). Left eye exhibits no discharge. No scleral icterus.  Neck: Normal range of motion. No JVD present. No tracheal deviation present.  Pulmonary/Chest: Effort normal. No stridor.  Abdominal: Soft. He exhibits no distension and no mass. There is tenderness. There is no rebound and no guarding.  Tenderness to palpation of the left upper/  lower quadrant  Neurological: He is alert and oriented to person, place, and time. Coordination normal.  Skin: Skin is warm and dry. No rash noted. No erythema. No pallor.  Psychiatric: He has a normal mood and affect. His behavior is normal. Judgment and thought content normal.  Nursing note and vitals reviewed.   ED Course  Procedures (including critical care time) Labs Review Labs Reviewed  I-STAT CHEM 8, ED - Abnormal; Notable for the following:    Creatinine, Ser 1.30 (*)    Glucose, Bld 106 (*)    Calcium, Ion 1.27 (*)    All other components within  normal limits  URINALYSIS, ROUTINE W REFLEX MICROSCOPIC (NOT AT [facility])    Imaging Review Ct Renal Stone Study  09/18/2015  CLINICAL DATA:  Lower left pelvic pain EXAM: CT ABDOMEN AND PELVIS WITHOUT CONTRAST TECHNIQUE: Multidetector CT imaging of the abdomen and pelvis was performed following the standard protocol without IV contrast. COMPARISON:  04/04/2015 and 10/15/2013 FINDINGS: Lower chest: No pleural effusion. There is subsegmental atelectasis in the lung bases. Stable sub pleural nodules within  the right middle lobe, image number 5 of series. These are compatible with a benign process. Hepatobiliary: No mass visualized on this un-enhanced exam. Pancreas: No mass or inflammatory process identified on this un-enhanced exam. Spleen: Within normal limits in size. Adrenals/Urinary Tract: Normal appearance of the adrenal glands. Several small renal calculi identified. The largest measures 4 mm, image 39 of series 2. No right-sided hydronephrosis or hydroureter. Several left renal stones are identified which measure up to 5 mm, image 34 of series 2. There is mild left-sided hydronephrosis secondary to UPJ calculus measuring 4 mm, image 49 of series 2. The urinary bladder appears normal. Stomach/Bowel: The stomach is within normal limits. The small bowel loops have a normal course and caliber. No obstruction. Numerous distal colonic diverticula identified without acute inflammation. Vascular/Lymphatic: Calcified atherosclerotic disease involves the abdominal aorta. No aneurysm. No enlarged retroperitoneal or mesenteric adenopathy. No enlarged pelvic or inguinal lymph nodes. Reproductive: The prostate gland is unremarkable. Other: None. Musculoskeletal: Degenerative disc disease noted within the lumbar spine. The patient is status post posterior hardware fixation of L1 through L5. No suspicious bone lesions. IMPRESSION: 1. Left-sided hydronephrosis secondary to 4 mm UPJ calculus. 2. Bilateral nephrolithiasis. 3.  Aortic atherosclerosis. Electronically Signed   By: Kerby Moors M.D.   On: 09/18/2015 07:20   I have personally reviewed and evaluated these images and lab results as part of my medical decision-making.   EKG Interpretation None      MDM   Final diagnoses:  Kidney stones    Labs:Urinalysis, i-STAT Chem-8  Imaging: CT renal study  Consults:   Therapeutics: Morphine, normal saline, Flomax, Toradol, fentanyl, Zofran  Discharge Meds:   Assessment/Plan: 60 year old male presents today with flank pain. Patient has a history of the same, reports this feels worse in the flank than previous. Pain managed here with Toradol and morphine, CT scan shows 4 mm UPJ calculus with left-sided hydronephrosis. Patient's symptoms under control, tolerating by mouth, afebrile with no urinary tract infection. Patient will be discharged home with symptomatic care instructions and strict return precautions. This is a 4 mm stone likely will pass. Urology follow-up indicated.         Okey Regal, PA-C 09/18/15 [place], MD 09/20/15 [phone]

## 2015-09-18 NOTE — Discharge Instructions (Signed)
Kidney Stones Kidney stones (urolithiasis) are deposits that form inside your kidneys. The intense pain is caused by the stone moving through the urinary tract. When the stone moves, the ureter goes into spasm around the stone. The stone is usually passed in the urine.  CAUSES   A disorder that makes certain neck glands produce too much parathyroid hormone (primary hyperparathyroidism).  A buildup of uric acid crystals, similar to gout in your joints.  Narrowing (stricture) of the ureter.  A kidney obstruction present at birth (congenital obstruction).  Previous surgery on the kidney or ureters.  Numerous kidney infections. SYMPTOMS   Feeling sick to your stomach (nauseous).  Throwing up (vomiting).  Blood in the urine (hematuria).  Pain that usually spreads (radiates) to the groin.  Frequency or urgency of urination. DIAGNOSIS   Taking a history and physical exam.  Blood or urine tests.  CT scan.  Occasionally, an examination of the inside of the urinary bladder (cystoscopy) is performed. TREATMENT   Observation.  Increasing your fluid intake.  Extracorporeal shock wave lithotripsy--This is a noninvasive procedure that uses shock waves to break up kidney stones.  Surgery may be needed if you have severe pain or persistent obstruction. There are various surgical procedures. Most of the procedures are performed with the use of small instruments. Only small incisions are needed to accommodate these instruments, so recovery time is minimized. The size, location, and chemical composition are all important variables that will determine the proper choice of action for you. Talk to your health care provider to better understand your situation so that you will minimize the risk of injury to yourself and your kidney.  HOME CARE INSTRUCTIONS   Drink enough water and fluids to keep your urine clear or pale yellow. This will help you to pass the stone or stone fragments.  Strain  all urine through the provided strainer. Keep all particulate matter and stones for your health care provider to see. The stone causing the pain may be as small as a grain of salt. It is very important to use the strainer each and every time you pass your urine. The collection of your stone will allow your health care provider to analyze it and verify that a stone has actually passed. The stone analysis will often identify what you can do to reduce the incidence of recurrences.  Only take over-the-counter or prescription medicines for pain, discomfort, or fever as directed by your health care provider.  Keep all follow-up visits as told by your health care provider. This is important.  Get follow-up X-rays if required. The absence of pain does not always mean that the stone has passed. It may have only stopped moving. If the urine remains completely obstructed, it can cause loss of kidney function or even complete destruction of the kidney. It is your responsibility to make sure X-rays and follow-ups are completed. Ultrasounds of the kidney can show blockages and the status of the kidney. Ultrasounds are not associated with any radiation and can be performed easily in a matter of minutes.  Make changes to your daily diet as told by your health care provider. You may be told to:  Limit the amount of salt that you eat.  Eat 5 or more servings of fruits and vegetables each day.  Limit the amount of meat, poultry, fish, and eggs that you eat.  Collect a 24-hour urine sample as told by your health care provider.You may need to collect another urine sample every 6-12   months. SEEK MEDICAL CARE IF:  You experience pain that is progressive and unresponsive to any pain medicine you have been prescribed. SEEK IMMEDIATE MEDICAL CARE IF:   Pain cannot be controlled with the prescribed medicine.  You have a fever or shaking chills.  The severity or intensity of pain increases over 18 hours and is not  relieved by pain medicine.  You develop a new onset of abdominal pain.  You feel faint or pass out.  You are unable to urinate.   This information is not intended to replace advice given to you by your health care provider. Make sure you discuss any questions you have with your health care provider.   Document Released: 02/19/2005 Document Revised: 11/10/2014 Document Reviewed: 07/23/2012 Elsevier Interactive Patient Education 2016 [employer] Dietary Guidelines to Help Prevent Kidney Stones Your risk of kidney stones can be decreased by adjusting the foods you eat. The most important thing you can do is drink enough fluid. You should drink enough fluid to keep your urine clear or pale yellow. The following guidelines provide specific information for the type of kidney stone you have had. GUIDELINES ACCORDING TO TYPE OF KIDNEY STONE Calcium Oxalate Kidney Stones  Reduce the amount of salt you eat. Foods that have a lot of salt cause your body to release excess calcium into your urine. The excess calcium can combine with a substance called oxalate to form kidney stones.  Reduce the amount of animal protein you eat if the amount you eat is excessive. Animal protein causes your body to release excess calcium into your urine. Ask your dietitian how much protein from animal sources you should be eating.  Avoid foods that are high in oxalates. If you take vitamins, they should have less than 500 mg of vitamin C. Your body turns vitamin C into oxalates. You do not need to avoid fruits and vegetables high in vitamin C. Calcium Phosphate Kidney Stones  Reduce the amount of salt you eat to help prevent the release of excess calcium into your urine.  Reduce the amount of animal protein you eat if the amount you eat is excessive. Animal protein causes your body to release excess calcium into your urine. Ask your dietitian how much protein from animal sources you should be eating.  Get enough  calcium from food or take a calcium supplement (ask your dietitian for recommendations). Food sources of calcium that do not increase your risk of kidney stones include:  Broccoli.  Dairy products, such as cheese and yogurt.  Pudding. Uric Acid Kidney Stones  Do not have more than 6 oz of animal protein per day. FOOD SOURCES Animal Protein Sources  Meat (all types).  Poultry.  Eggs.  Fish, seafood. Foods High in Salt  Salt seasonings.  Soy sauce.  Teriyaki sauce.  Cured and processed meats.  Salted crackers and snack foods.  Fast food.  Canned soups and most canned foods. Foods High in Oxalates  Grains:  Amaranth.  Barley.  Grits.  Wheat germ.  Bran.  Buckwheat flour.  All bran cereals.  Pretzels.  Whole wheat bread.  Vegetables:  Beans (wax).  Beets and beet greens.  Collard greens.  Eggplant.  [place]scarole.  Leeks.  Okra.  Parsley.  Rutabagas.  Spinach.  Swiss chard.  Tomato paste.  Fried potatoes.  Sweet potatoes.  Fruits:  Red currants.  Figs.  Kiwi.  Rhubarb.  Meat and Other Protein Sources:  Beans (dried).  Soy burgers and other soybean products.  Miso.    Nuts (peanuts, almonds, pecans, cashews, hazelnuts).  Nut butters.  Sesame seeds and tahini (paste made of sesame seeds).  Poppy seeds.  Beverages:  Chocolate drink mixes.  Soy milk.  Instant iced tea.  Juices made from high-oxalate fruits or vegetables.  Other:  Carob.  Chocolate.  Fruitcake.  Marmalades.   This information is not intended to replace advice given to you by your health care provider. Make sure you discuss any questions you have with your health care provider.   Document Released: 06/16/2010 Document Revised: 02/24/2013 Document Reviewed: 01/16/2013 Elsevier Interactive Patient Education 2016 [employer] Inc[employer]  

## 2016-09-16 ENCOUNTER — Encounter (HOSPITAL_COMMUNITY): Payer: Self-pay | Admitting: Emergency Medicine

## 2016-09-16 ENCOUNTER — Emergency Department (HOSPITAL_COMMUNITY)
Admission: EM | Admit: 2016-09-16 | Discharge: 2016-09-16 | Disposition: A | Discharge status: Home / Self Care | Payer: Medicare Other | Attending: Emergency Medicine | Admitting: Emergency Medicine

## 2016-09-16 ENCOUNTER — Emergency Department (HOSPITAL_COMMUNITY): Payer: Medicare Other

## 2016-09-16 ENCOUNTER — Ambulatory Visit (HOSPITAL_COMMUNITY): Admission: EM | Admit: 2016-09-16 | Discharge: 2016-09-16 | Payer: Medicare Other | Source: Home / Self Care

## 2016-09-16 DIAGNOSIS — Z79899 Other long term (current) drug therapy: Secondary | ICD-10-CM | POA: Insufficient documentation

## 2016-09-16 DIAGNOSIS — Z87891 Personal history of nicotine dependence: Secondary | ICD-10-CM | POA: Diagnosis not present

## 2016-09-16 DIAGNOSIS — R42 Dizziness and giddiness: Secondary | ICD-10-CM | POA: Insufficient documentation

## 2016-09-16 DIAGNOSIS — D649 Anemia, unspecified: Secondary | ICD-10-CM | POA: Insufficient documentation

## 2016-09-16 DIAGNOSIS — R079 Chest pain, unspecified: Secondary | ICD-10-CM

## 2016-09-16 DIAGNOSIS — I10 Essential (primary) hypertension: Secondary | ICD-10-CM | POA: Insufficient documentation

## 2016-09-16 DIAGNOSIS — R0789 Other chest pain: Secondary | ICD-10-CM | POA: Diagnosis not present

## 2016-09-16 LAB — TROPONIN I

## 2016-09-16 LAB — CBC
HEMATOCRIT: 39.2 % (ref 39.0–52.0)
Hemoglobin: 13.4 g/dL (ref 13.0–17.0)
MCH: 30.4 pg (ref 26.0–34.0)
MCHC: 34.2 g/dL (ref 30.0–36.0)
MCV: 88.9 fL (ref 78.0–100.0)
PLATELETS: 309 10*3/uL (ref 150–400)
RBC: 4.41 MIL/uL (ref 4.22–5.81)
RDW: 12.6 % (ref 11.5–15.5)
WBC: 4.8 10*3/uL (ref 4.0–10.5)

## 2016-09-16 LAB — BASIC METABOLIC PANEL
Anion gap: 9 (ref 5–15)
BUN: 20 mg/dL (ref 6–20)
CHLORIDE: 103 mmol/L (ref 101–111)
CO2: 24 mmol/L (ref 22–32)
CREATININE: 1.46 mg/dL — AB (ref 0.61–1.24)
Calcium: 10 mg/dL (ref 8.9–10.3)
GFR calc Af Amer: 58 mL/min — ABNORMAL LOW (ref >60)
GFR calc non Af Amer: 50 mL/min — ABNORMAL LOW (ref >60)
Glucose, Bld: 114 mg/dL — ABNORMAL HIGH (ref 65–99)
POTASSIUM: 3.7 mmol/L (ref 3.5–5.1)
Sodium: 136 mmol/L (ref 135–145)

## 2016-09-16 LAB — I-STAT TROPONIN, ED: Troponin i, poc: 0.01 ng/mL (ref 0.00–0.08)

## 2016-09-16 LAB — D-DIMER, QUANTITATIVE: D-Dimer, Quant: <0.27 ug/mL-FEU (ref 0.00–0.50)

## 2016-09-16 MED ORDER — SODIUM CHLORIDE 0.9 % IV BOLUS (SEPSIS)
500.0000 mL | Freq: Once | INTRAVENOUS | Status: AC
  Administered 2016-09-16: 500 mL via INTRAVENOUS

## 2016-09-16 NOTE — ED Notes (Signed)
Pt's wife asked this RN to notate pt has encountered multiple "bug bites" recently

## 2016-09-16 NOTE — ED Notes (Signed)
Pt was given ice water 

## 2016-09-16 NOTE — ED Provider Notes (Signed)
[facility] DEPT Provider Note   CSN: [id number] Arrival date & time: 09/16/16  1250     History   Chief Complaint Chief Complaint  Patient presents with  . Chest Pain  . Dizziness    HPI Steven Ortega is a 61 y.o. male.  The history is provided by the patient. No language interpreter was used.  Chest Pain   Associated symptoms include dizziness.  Dizziness  Associated symptoms: chest pain    Steven Ortega is a 61 y.o. male who presents to the Emergency Department complaining of CP, dizziness.  He reports a throbbing chest pain that began yesterday in the center of his chest. Pain is constant in nature but doesn't worsen at times. There are no clear alleviating or worsening factors. Throughout the day yesterday his pain had improved and when he awoke this morning he had recurrent pain. He also reports feeling dizzy today as if he might pass out. He feels like he has to take a deep breath at times and has associated shortness of breath. No fevers, cough, vomiting, leg swelling or pain. No prior similar symptoms. He does have a history of hypertension, hyperlipidemia, family history of cardiac disease, 2 brothers died of heart attacks in their 44s. He has no history of cancer, blood clots. He is a nonsmoker. He had a stress test performed in April of this year and was told it was normal. Past Medical History:  Diagnosis Date  . Adenomatous colon polyp   . Anal fissure   . Anemia   . Anxiety   . Arthritis   . Constipation   . Depression   . Diverticulosis   . GERD (gastroesophageal reflux disease)   . Hyperlipidemia   . Hypertension   . Hypothyroidism   . Kidney stones   . Kidney stones   . Pyloric erosion     Patient Active Problem List   Diagnosis Date Noted  . Diverticulitis of colon (without mention of hemorrhage)(562.11) 10/13/2013  . Abdominal pain, left lower quadrant 10/13/2013  . ANEMIA DUE TO CHRONIC BLOOD LOSS 07/30/2008  . GASTRITIS, CHRONIC  06/29/2008  . COLONIC POLYPS 03/11/2008  . HYPERLIPIDEMIA 03/11/2008  . UNSPECIFIED ANEMIA 03/11/2008  . ANXIETY 03/11/2008  . DEPRESSION 03/11/2008  . HYPERTENSION 03/11/2008  . DIVERTICULOSIS, COLON 03/11/2008  . ANAL FISSURE 03/11/2008  . CHRONIC KIDNEY DISEASE UNSPECIFIED 03/11/2008  . NEPHROLITHIASIS 03/11/2008  . CHEST PAIN UNSPECIFIED 03/11/2008  . LUQ PAIN 03/11/2008  . ARTHRITIS, HX OF 03/11/2008    Past Surgical History:  Procedure Laterality Date  . ANAL FISSURE REPAIR  2010  . APPENDECTOMY  1984  . LUMBAR FUSION  2013  . THUMB FUSION Left 2008  . THUMB FUSION Right 2008   x 2  . TRIGGER FINGER RELEASE Left 2008   2nd finger       Home Medications    Prior to Admission medications   Medication Sig Start Date End Date Taking? Authorizing Provider  acyclovir (ZOVIRAX) 800 MG tablet Take 400 mg by mouth daily.    Yes [provider]  ALPRAZolam Duanne Moron) 1 MG tablet Take 1 mg by mouth 3 (three) times daily as needed for anxiety.   Yes [provider]  buPROPion (ZYBAN) 150 MG 12 hr tablet Take 150 mg by mouth 2 (two) times daily.   Yes [provider]  Cholecalciferol 2000 UNITS CAPS Take 2,000 Units by mouth daily.    Yes [provider]  citalopram (CELEXA) 40 MG tablet  Take 40 mg by mouth daily.    Yes [provider]  cyanocobalamin 500 MCG tablet Take 500 mcg by mouth 4 (four) times daily.    Yes [provider]  cyclobenzaprine (FLEXERIL) 10 MG tablet Take 10 mg by mouth at bedtime.   Yes [provider]  dicyclomine (BENTYL) 10 MG capsule Take 10 mg by mouth 4 (four) times daily -  before meals and at bedtime.   Yes [provider]  docusate sodium (STOOL SOFTENER) 100 MG capsule Take 100 mg by mouth 4 (four) times daily.    Yes [provider]  ferrous sulfate 325 (65 FE) MG tablet Take 325 mg by mouth daily with breakfast.   Yes [provider]  gemfibrozil (LOPID) 600  MG tablet Take 600 mg by mouth daily.    Yes [provider]  hydrochlorothiazide (HYDRODIURIL) 25 MG tablet Take 25 mg by mouth daily.   Yes [provider]  levothyroxine (SYNTHROID, LEVOTHROID) 25 MCG tablet Take 25 mcg by mouth daily before breakfast.   Yes [provider]  Omega-3 Fatty Acids (FISH OIL) 1000 MG CAPS Take 1,000 mg by mouth 2 (two) times daily.    Yes [provider]  omeprazole (PRILOSEC) 20 MG capsule Take 20 mg by mouth daily.   Yes [provider]  polyethylene glycol (MIRALAX / GLYCOLAX) packet Take 17 g by mouth daily.   Yes [provider]  sodium bicarbonate 325 MG tablet Take 325 mg by mouth 2 (two) times daily.   Yes [provider]  tamsulosin (FLOMAX) 0.4 MG CAPS capsule Take 1 capsule (0.4 mg total) by mouth daily. 09/18/15  Yes Hedges, Dellis Filbert, PA-C  traMADol (ULTRAM) 50 MG tablet Take 50 mg by mouth every 6 (six) hours as needed for moderate pain.   Yes [provider]  HYDROcodone-acetaminophen (NORCO/VICODIN) 5-325 MG tablet Take 1 tablet by mouth every 4 (four) hours as needed. Patient not taking: Reported on 09/16/2016 09/18/15   Okey Regal, PA-C    Family History Family History  Problem Relation Age of Onset  . Heart attack Brother        x 2  . Heart attack Father   . Lung cancer Father   . Colon cancer Neg Hx     Social History Social History  Substance Use Topics  . Smoking status: Former Smoker    Types: Cigarettes    Quit date: 03/05/1998  . Smokeless tobacco: Never Used  . Alcohol use 0.6 oz/week    1 Cans of beer per week     Comment: occasional     Allergies   Patient has no known allergies.   Review of Systems Review of Systems  Cardiovascular: Positive for chest pain.  Neurological: Positive for dizziness.  All other systems reviewed and are negative.    Physical Exam Updated Vital Signs BP 135/84   Pulse 62   Temp 98.3 F (36.8 C) (Oral)    Resp 16   Wt 95.3 kg (210 lb)   SpO2 98%   BMI 28.48 kg/m   Physical Exam  Constitutional: He is oriented to person, place, and time. He appears well-developed and well-nourished.  HENT:  Head: Normocephalic and atraumatic.  Cardiovascular: Normal rate and regular rhythm.   No murmur heard. Pulmonary/Chest: Effort normal and breath sounds normal. No respiratory distress.  Abdominal: Soft. There is no tenderness. There is no rebound and no guarding.  Musculoskeletal: He exhibits no edema or tenderness.  Neurological: He is alert and oriented to person, place, and time.  Skin: Skin is warm and dry.  Psychiatric: He has a normal mood and affect. His behavior is normal.  Nursing note and vitals reviewed.    ED Treatments / Results  Labs (all labs ordered are listed, but only abnormal results are displayed) Labs Reviewed  BASIC METABOLIC PANEL - Abnormal; Notable for the following:       Result Value   Glucose, Bld 114 (*)    Creatinine, Ser 1.46 (*)    GFR calc non Af Amer 50 (*)    GFR calc Af Amer 58 (*)    All other components within normal limits  CBC  D-DIMER, QUANTITATIVE (NOT AT [facility])  TROPONIN I  I-STAT TROPOININ, ED    EKG  EKG Interpretation  Date/Time:  Sunday September 16 2016 12:54:36 EDT Ventricular Rate:  98 PR Interval:  150 QRS Duration: 94 QT Interval:  356 QTC Calculation: 454 R Axis:   7 Text Interpretation:  Normal sinus rhythm Normal ECG Confirmed by Hazle Coca [id number]) on 09/16/2016 1:58:13 PM       Radiology Dg Chest 2 View  Result Date: 09/16/2016 CLINICAL DATA:  Irregular heartbeat, dizziness and chest pain for a few days, history of anxiety, hypertension, GERD EXAM: CHEST  2 VIEW COMPARISON:  10/31/2007 FINDINGS: Normal heart size, mediastinal contours, and pulmonary vascularity. Minimal RIGHT basilar atelectasis. Lungs otherwise clear. No pleural effusion or pneumothorax. Prior lumbar fusion. IMPRESSION: Minimal RIGHT basilar atelectasis.  Electronically Signed   By: Lavonia Dana M.D.   On: 09/16/2016 13:30    Procedures Procedures (including critical care time)  Medications Ordered in ED Medications  sodium chloride 0.9 % bolus 500 mL (500 mLs Intravenous New Bag/Given 09/16/16 1534)     Initial Impression / Assessment and Plan / ED Course  I have reviewed the triage vital signs and the nursing notes.  Pertinent labs & imaging results that were available during my care of the patient were reviewed by me and considered in my medical decision making (see chart for details).   patient here for evaluation of 2 days of chest pain as well as 1 day of dizziness. History and presentation is not consistent with ACS, dissection. EKG without acute ischemic changes. Patient care transferred pending repeat troponin and d-dimer.  Final Clinical Impressions(s) / ED Diagnoses   Final diagnoses:  None    New Prescriptions New Prescriptions   No medications on file     Quintella Reichert, MD 09/16/16 [phone]

## 2016-09-16 NOTE — ED Triage Notes (Signed)
Cp for a few days and then has been getting dizzy feels like his heart may have skipped a few beats

## 2016-09-16 NOTE — Discharge Instructions (Signed)
Please contact your Dr. for recheck in the next one to 3 days. Return as discussed if any worsening pain or shortness of breath.

## 2016-09-16 NOTE — ED Provider Notes (Signed)
Patient signout received from Dr. Ayesha Rumpf. She reports patient with low risk heart score presents with chest pain today. Patient had EKG without acute ischemia and troponins are normal 2. Both Dr. Ayesha Rumpf and I discussed plan with patient and he and wife voice understanding.   Pattricia Boss, MD 09/16/16 [phone]

## 2016-09-16 NOTE — ED Notes (Signed)
Gave pt a sprite and a sandwich

## 2017-12-04 ENCOUNTER — Ambulatory Visit (INDEPENDENT_AMBULATORY_CARE_PROVIDER_SITE_OTHER): Payer: PRIVATE HEALTH INSURANCE | Admitting: Orthopedic Surgery

## 2017-12-04 ENCOUNTER — Encounter (INDEPENDENT_AMBULATORY_CARE_PROVIDER_SITE_OTHER): Payer: Self-pay | Admitting: Orthopedic Surgery

## 2017-12-04 DIAGNOSIS — M75112 Incomplete rotator cuff tear or rupture of left shoulder, not specified as traumatic: Secondary | ICD-10-CM

## 2017-12-06 ENCOUNTER — Other Ambulatory Visit (INDEPENDENT_AMBULATORY_CARE_PROVIDER_SITE_OTHER): Payer: Self-pay | Admitting: Orthopedic Surgery

## 2017-12-06 ENCOUNTER — Other Ambulatory Visit: Payer: Self-pay

## 2017-12-06 ENCOUNTER — Encounter (HOSPITAL_COMMUNITY): Payer: Self-pay | Admitting: *Deleted

## 2017-12-06 DIAGNOSIS — M7552 Bursitis of left shoulder: Secondary | ICD-10-CM

## 2017-12-06 NOTE — Progress Notes (Signed)
Pt denies SOB, chest pain, and being under the care of a cardiologist. Pt denies having an echo and cardiac cath. Pt stated that a stress test was performed at the [facility] in [place]; records requested. Pt denies having an EKG and chest x ray within the last year. Pt denies recent labs. Pt made aware to stop taking vitamins, fish oil and herbal medications. Do not take any NSAIDs ie: Ibuprofen, Advil, Naproxen (Aleve), Motrin, BC and Goody Powder. Pt verbalized understanding of all pre-op instructions.

## 2017-12-09 ENCOUNTER — Other Ambulatory Visit: Payer: Self-pay

## 2017-12-09 ENCOUNTER — Encounter (HOSPITAL_COMMUNITY)
Admission: RE | Disposition: A | Discharge status: Home / Self Care | Payer: Self-pay | Source: Ambulatory Visit | Attending: Orthopedic Surgery

## 2017-12-09 ENCOUNTER — Ambulatory Visit (HOSPITAL_COMMUNITY): Payer: No Typology Code available for payment source | Admitting: Anesthesiology

## 2017-12-09 ENCOUNTER — Ambulatory Visit (HOSPITAL_COMMUNITY)
Admission: RE | Admit: 2017-12-09 | Discharge: 2017-12-09 | Disposition: A | Discharge status: Home / Self Care | Payer: No Typology Code available for payment source | Source: Ambulatory Visit | Attending: Orthopedic Surgery | Admitting: Orthopedic Surgery

## 2017-12-09 ENCOUNTER — Encounter (INDEPENDENT_AMBULATORY_CARE_PROVIDER_SITE_OTHER): Payer: Self-pay | Admitting: Orthopedic Surgery

## 2017-12-09 ENCOUNTER — Encounter (HOSPITAL_COMMUNITY): Payer: Self-pay | Admitting: Surgery

## 2017-12-09 DIAGNOSIS — F329 Major depressive disorder, single episode, unspecified: Secondary | ICD-10-CM | POA: Insufficient documentation

## 2017-12-09 DIAGNOSIS — Z79899 Other long term (current) drug therapy: Secondary | ICD-10-CM | POA: Diagnosis not present

## 2017-12-09 DIAGNOSIS — E039 Hypothyroidism, unspecified: Secondary | ICD-10-CM | POA: Insufficient documentation

## 2017-12-09 DIAGNOSIS — M75122 Complete rotator cuff tear or rupture of left shoulder, not specified as traumatic: Secondary | ICD-10-CM | POA: Diagnosis not present

## 2017-12-09 DIAGNOSIS — Z87891 Personal history of nicotine dependence: Secondary | ICD-10-CM | POA: Diagnosis not present

## 2017-12-09 DIAGNOSIS — M65812 Other synovitis and tenosynovitis, left shoulder: Secondary | ICD-10-CM | POA: Diagnosis not present

## 2017-12-09 DIAGNOSIS — E785 Hyperlipidemia, unspecified: Secondary | ICD-10-CM | POA: Diagnosis not present

## 2017-12-09 DIAGNOSIS — M75112 Incomplete rotator cuff tear or rupture of left shoulder, not specified as traumatic: Secondary | ICD-10-CM | POA: Insufficient documentation

## 2017-12-09 DIAGNOSIS — M7552 Bursitis of left shoulder: Secondary | ICD-10-CM | POA: Insufficient documentation

## 2017-12-09 DIAGNOSIS — Z87442 Personal history of urinary calculi: Secondary | ICD-10-CM | POA: Insufficient documentation

## 2017-12-09 DIAGNOSIS — K219 Gastro-esophageal reflux disease without esophagitis: Secondary | ICD-10-CM | POA: Insufficient documentation

## 2017-12-09 DIAGNOSIS — I10 Essential (primary) hypertension: Secondary | ICD-10-CM | POA: Insufficient documentation

## 2017-12-09 HISTORY — DX: Presence of external hearing-aid: Z97.4

## 2017-12-09 HISTORY — PX: SHOULDER ARTHROSCOPY WITH SUBACROMIAL DECOMPRESSION, ROTATOR CUFF REPAIR AND BICEP TENDON REPAIR: SHX5687

## 2017-12-09 HISTORY — DX: Unspecified hearing loss, unspecified ear: H91.90

## 2017-12-09 HISTORY — DX: Presence of dental prosthetic device (complete) (partial): Z97.2

## 2017-12-09 HISTORY — DX: Presence of spectacles and contact lenses: Z97.3

## 2017-12-09 LAB — BASIC METABOLIC PANEL WITH GFR
Anion gap: 6 (ref 5–15)
BUN: 15 mg/dL (ref 8–23)
CO2: 23 mmol/L (ref 22–32)
Calcium: 9.2 mg/dL (ref 8.9–10.3)
Chloride: 106 mmol/L (ref 98–111)
Creatinine, Ser: 1.17 mg/dL (ref 0.61–1.24)
GFR calc Af Amer: >60 mL/min
GFR calc non Af Amer: >60 mL/min
Glucose, Bld: 95 mg/dL (ref 70–99)
Potassium: 3.7 mmol/L (ref 3.5–5.1)
Sodium: 135 mmol/L (ref 135–145)

## 2017-12-09 LAB — CBC
HCT: 39.8 % (ref 39.0–52.0)
Hemoglobin: 12.8 g/dL — ABNORMAL LOW (ref 13.0–17.0)
MCH: 29.8 pg (ref 26.0–34.0)
MCHC: 32.2 g/dL (ref 30.0–36.0)
MCV: 92.6 fL (ref 78.0–100.0)
PLATELETS: 253 10*3/uL (ref 150–400)
RBC: 4.3 MIL/uL (ref 4.22–5.81)
RDW: 12.4 % (ref 11.5–15.5)
WBC: 4.7 10*3/uL (ref 4.0–10.5)

## 2017-12-09 SURGERY — SHOULDER ARTHROSCOPY WITH SUBACROMIAL DECOMPRESSION, ROTATOR CUFF REPAIR AND BICEP TENDON REPAIR
Anesthesia: General | Site: Shoulder | Laterality: Left

## 2017-12-09 MED ORDER — FENTANYL CITRATE (PF) 100 MCG/2ML IJ SOLN
INTRAMUSCULAR | Status: AC
  Administered 2017-12-09: 50 ug via INTRAVENOUS
  Filled 2017-12-09: qty 2

## 2017-12-09 MED ORDER — PROPOFOL 10 MG/ML IV BOLUS
INTRAVENOUS | Status: AC
  Filled 2017-12-09: qty 20

## 2017-12-09 MED ORDER — EPINEPHRINE PF 1 MG/ML IJ SOLN
INTRAMUSCULAR | Status: DC | PRN
  Administered 2017-12-09: 1 mg via SUBCUTANEOUS

## 2017-12-09 MED ORDER — EPHEDRINE SULFATE 50 MG/ML IJ SOLN
INTRAMUSCULAR | Status: DC | PRN
  Administered 2017-12-09 (×6): 10 mg via INTRAVENOUS

## 2017-12-09 MED ORDER — ONDANSETRON HCL 4 MG/2ML IJ SOLN
INTRAMUSCULAR | Status: DC | PRN
  Administered 2017-12-09: 4 mg via INTRAVENOUS

## 2017-12-09 MED ORDER — ROCURONIUM BROMIDE 50 MG/5ML IV SOSY
PREFILLED_SYRINGE | INTRAVENOUS | Status: AC
  Filled 2017-12-09: qty 5

## 2017-12-09 MED ORDER — LACTATED RINGERS IV SOLN
INTRAVENOUS | Status: DC
  Administered 2017-12-09 (×2): via INTRAVENOUS

## 2017-12-09 MED ORDER — GLYCOPYRROLATE 0.2 MG/ML IJ SOLN
INTRAMUSCULAR | Status: DC | PRN
  Administered 2017-12-09: 0.2 mg via INTRAVENOUS

## 2017-12-09 MED ORDER — PHENYLEPHRINE HCL 10 MG/ML IJ SOLN
INTRAMUSCULAR | Status: DC | PRN
  Administered 2017-12-09 (×9): 80 ug via INTRAVENOUS

## 2017-12-09 MED ORDER — FENTANYL CITRATE (PF) 250 MCG/5ML IJ SOLN
INTRAMUSCULAR | Status: AC
  Filled 2017-12-09: qty 5

## 2017-12-09 MED ORDER — MIDAZOLAM HCL 2 MG/2ML IJ SOLN
INTRAMUSCULAR | Status: AC
  Administered 2017-12-09: 1 mg via INTRAVENOUS
  Filled 2017-12-09: qty 2

## 2017-12-09 MED ORDER — MIDAZOLAM HCL 2 MG/2ML IJ SOLN
1.0000 mg | Freq: Once | INTRAMUSCULAR | Status: AC
  Administered 2017-12-09: 1 mg via INTRAVENOUS

## 2017-12-09 MED ORDER — MIDAZOLAM HCL 2 MG/2ML IJ SOLN: 0.5000 mg | Freq: Once | INTRAMUSCULAR | Status: DC | PRN

## 2017-12-09 MED ORDER — SUGAMMADEX SODIUM 200 MG/2ML IV SOLN
INTRAVENOUS | Status: DC | PRN
  Administered 2017-12-09: 200 mg via INTRAVENOUS

## 2017-12-09 MED ORDER — MEPERIDINE HCL 50 MG/ML IJ SOLN: 6.2500 mg | INTRAMUSCULAR | Status: DC | PRN

## 2017-12-09 MED ORDER — FENTANYL CITRATE (PF) 100 MCG/2ML IJ SOLN
INTRAMUSCULAR | Status: DC | PRN
  Administered 2017-12-09: 25 ug via INTRAVENOUS
  Administered 2017-12-09: 100 ug via INTRAVENOUS

## 2017-12-09 MED ORDER — CEFAZOLIN SODIUM-DEXTROSE 2-4 GM/100ML-% IV SOLN
2.0000 g | INTRAVENOUS | Status: AC
  Administered 2017-12-09: 2 g via INTRAVENOUS
  Filled 2017-12-09: qty 100

## 2017-12-09 MED ORDER — PHENYLEPHRINE 40 MCG/ML (10ML) SYRINGE FOR IV PUSH (FOR BLOOD PRESSURE SUPPORT)
PREFILLED_SYRINGE | INTRAVENOUS | Status: AC
  Filled 2017-12-09: qty 10

## 2017-12-09 MED ORDER — LIDOCAINE 2% (20 MG/ML) 5 ML SYRINGE
INTRAMUSCULAR | Status: AC
  Filled 2017-12-09: qty 5

## 2017-12-09 MED ORDER — SODIUM CHLORIDE 0.9 % IR SOLN
Status: DC | PRN
  Administered 2017-12-09: 3000 mL

## 2017-12-09 MED ORDER — PROPOFOL 10 MG/ML IV BOLUS
INTRAVENOUS | Status: DC | PRN
  Administered 2017-12-09: 150 mg via INTRAVENOUS

## 2017-12-09 MED ORDER — SODIUM CHLORIDE 0.9 % IV SOLN
INTRAVENOUS | Status: DC | PRN
  Administered 2017-12-09: 50 ug/min via INTRAVENOUS

## 2017-12-09 MED ORDER — BUPIVACAINE LIPOSOME 1.3 % IJ SUSP
INTRAMUSCULAR | Status: DC | PRN
  Administered 2017-12-09: 10 mL via PERINEURAL

## 2017-12-09 MED ORDER — BUPIVACAINE-EPINEPHRINE 0.25% -1:200000 IJ SOLN
INTRAMUSCULAR | Status: DC | PRN
  Administered 2017-12-09: 30 mL

## 2017-12-09 MED ORDER — ONDANSETRON HCL 4 MG/2ML IJ SOLN
INTRAMUSCULAR | Status: AC
  Filled 2017-12-09: qty 2

## 2017-12-09 MED ORDER — GLYCOPYRROLATE PF 0.2 MG/ML IJ SOSY
PREFILLED_SYRINGE | INTRAMUSCULAR | Status: AC
  Filled 2017-12-09: qty 1

## 2017-12-09 MED ORDER — CHLORHEXIDINE GLUCONATE 4 % EX LIQD: 60.0000 mL | Freq: Once | CUTANEOUS | Status: DC

## 2017-12-09 MED ORDER — FENTANYL CITRATE (PF) 100 MCG/2ML IJ SOLN
50.0000 ug | Freq: Once | INTRAMUSCULAR | Status: AC
  Administered 2017-12-09: 50 ug via INTRAVENOUS

## 2017-12-09 MED ORDER — PROMETHAZINE HCL 25 MG/ML IJ SOLN: 6.2500 mg | INTRAMUSCULAR | Status: DC | PRN

## 2017-12-09 MED ORDER — ROCURONIUM BROMIDE 100 MG/10ML IV SOLN
INTRAVENOUS | Status: DC | PRN
  Administered 2017-12-09: 50 mg via INTRAVENOUS
  Administered 2017-12-09: 30 mg via INTRAVENOUS
  Administered 2017-12-09: 20 mg via INTRAVENOUS

## 2017-12-09 MED ORDER — EPINEPHRINE PF 1 MG/ML IJ SOLN
INTRAMUSCULAR | Status: AC
  Filled 2017-12-09: qty 4

## 2017-12-09 MED ORDER — BUPIVACAINE HCL (PF) 0.5 % IJ SOLN
INTRAMUSCULAR | Status: DC | PRN
  Administered 2017-12-09: 10 mL via PERINEURAL

## 2017-12-09 MED ORDER — HYDROMORPHONE HCL 1 MG/ML IJ SOLN: 0.2500 mg | INTRAMUSCULAR | Status: DC | PRN

## 2017-12-09 MED ORDER — BUPIVACAINE-EPINEPHRINE (PF) 0.25% -1:200000 IJ SOLN
INTRAMUSCULAR | Status: AC
  Filled 2017-12-09: qty 30

## 2017-12-09 MED ORDER — LIDOCAINE HCL (CARDIAC) PF 100 MG/5ML IV SOSY
PREFILLED_SYRINGE | INTRAVENOUS | Status: DC | PRN
  Administered 2017-12-09: 40 mg via INTRAVENOUS

## 2017-12-09 MED ORDER — EPHEDRINE 5 MG/ML INJ
INTRAVENOUS | Status: AC
  Filled 2017-12-09: qty 10

## 2017-12-09 SURGICAL SUPPLY — 74 items
ANCHOR SUT BIOCOMP CORKSREW (Anchor) ×3 IMPLANT
BLADE CUTTER GATOR 3.5 (BLADE) IMPLANT
BLADE GREAT WHITE 4.2 (BLADE) ×2 IMPLANT
BLADE GREAT WHITE 4.2MM (BLADE) ×1
BLADE SURG 11 STRL SS (BLADE) ×3 IMPLANT
BUR OVAL 6.0 (BURR) IMPLANT
CLOSURE STERI-STRIP 1/2X4 (GAUZE/BANDAGES/DRESSINGS) ×1
CLOSURE WOUND 1/2 X4 (GAUZE/BANDAGES/DRESSINGS) ×1
CLSR STERI-STRIP ANTIMIC 1/2X4 (GAUZE/BANDAGES/DRESSINGS) ×2 IMPLANT
COVER SURGICAL LIGHT HANDLE (MISCELLANEOUS) ×3 IMPLANT
COVER WAND RF STERILE (DRAPES) ×3 IMPLANT
DRAPE INCISE IOBAN 66X45 STRL (DRAPES) ×6 IMPLANT
DRAPE STERI 35X30 U-POUCH (DRAPES) ×3 IMPLANT
DRAPE U-SHAPE 47X51 STRL (DRAPES) ×6 IMPLANT
DRSG TEGADERM 4X4.75 (GAUZE/BANDAGES/DRESSINGS) ×9 IMPLANT
DURAPREP 26ML APPLICATOR (WOUND CARE) ×3 IMPLANT
ELECT REM PT RETURN 9FT ADLT (ELECTROSURGICAL) ×3
ELECTRODE REM PT RTRN 9FT ADLT (ELECTROSURGICAL) ×1 IMPLANT
FILTER STRAW FLUID ASPIR (MISCELLANEOUS) ×3 IMPLANT
GAUZE SPONGE 4X4 12PLY STRL (GAUZE/BANDAGES/DRESSINGS) ×3 IMPLANT
GAUZE SPONGE 4X4 12PLY STRL LF (GAUZE/BANDAGES/DRESSINGS) ×3 IMPLANT
GAUZE XEROFORM 1X8 LF (GAUZE/BANDAGES/DRESSINGS) ×3 IMPLANT
GLOVE BIOGEL PI IND STRL 7.5 (GLOVE) ×1 IMPLANT
GLOVE BIOGEL PI IND STRL 8 (GLOVE) ×1 IMPLANT
GLOVE BIOGEL PI INDICATOR 7.5 (GLOVE) ×2
GLOVE BIOGEL PI INDICATOR 8 (GLOVE) ×2
GLOVE ECLIPSE 7.0 STRL STRAW (GLOVE) ×3 IMPLANT
GLOVE SURG ORTHO 8.0 STRL STRW (GLOVE) ×3 IMPLANT
GOWN STRL REUS W/ TWL LRG LVL3 (GOWN DISPOSABLE) ×3 IMPLANT
GOWN STRL REUS W/TWL LRG LVL3 (GOWN DISPOSABLE) ×6
KIT BASIN OR (CUSTOM PROCEDURE TRAY) ×3 IMPLANT
KIT TURNOVER KIT B (KITS) ×3 IMPLANT
MANIFOLD NEPTUNE II (INSTRUMENTS) ×3 IMPLANT
NDL SUT 6 .5 CRC .975X.05 MAYO (NEEDLE) IMPLANT
NEEDLE HYPO 25X1 1.5 SAFETY (NEEDLE) IMPLANT
NEEDLE MAYO TAPER (NEEDLE)
NEEDLE SCORPION MULTI FIRE (NEEDLE) IMPLANT
NEEDLE SPNL 18GX3.5 QUINCKE PK (NEEDLE) ×3 IMPLANT
NS IRRIG 1000ML POUR BTL (IV SOLUTION) ×3 IMPLANT
PACK SHOULDER (CUSTOM PROCEDURE TRAY) ×3 IMPLANT
PAD ARMBOARD 7.5X6 YLW CONV (MISCELLANEOUS) ×6 IMPLANT
PUSHLOCK PEEK 4.5X24 (Orthopedic Implant) ×3 IMPLANT
RESTRAINT HEAD UNIVERSAL NS (MISCELLANEOUS) ×3 IMPLANT
SET ARTHROSCOPY TUBING (MISCELLANEOUS) ×2
SET ARTHROSCOPY TUBING LN (MISCELLANEOUS) ×1 IMPLANT
SLING ARM FOAM STRAP LRG (SOFTGOODS) ×3 IMPLANT
SLING ARM IMMOBILIZER LRG (SOFTGOODS) IMPLANT
SPONGE LAP 4X18 RFD (DISPOSABLE) ×6 IMPLANT
STRIP CLOSURE SKIN 1/2X4 (GAUZE/BANDAGES/DRESSINGS) ×2 IMPLANT
SUCTION FRAZIER HANDLE 10FR (MISCELLANEOUS) ×2
SUCTION FRAZIER TIP 10 FR DISP (SUCTIONS) ×3 IMPLANT
SUCTION TUBE FRAZIER 10FR DISP (MISCELLANEOUS) ×1 IMPLANT
SUT 2 FIBERLOOP 20 STRT BLUE (SUTURE)
SUT ETHILON 3 0 PS 1 (SUTURE) ×9 IMPLANT
SUT FIBERWIRE #2 38 T-5 BLUE (SUTURE)
SUT MNCRL AB 3-0 PS2 18 (SUTURE) ×3 IMPLANT
SUT VIC AB 0 CT1 27 (SUTURE) ×2
SUT VIC AB 0 CT1 27XBRD ANBCTR (SUTURE) ×1 IMPLANT
SUT VIC AB 1 CT1 27 (SUTURE)
SUT VIC AB 1 CT1 27XBRD ANBCTR (SUTURE) IMPLANT
SUT VIC AB 2-0 CT1 27 (SUTURE) ×2
SUT VIC AB 2-0 CT1 TAPERPNT 27 (SUTURE) ×1 IMPLANT
SUT VICRYL 0 UR6 27IN ABS (SUTURE) ×3 IMPLANT
SUTURE 2 FIBERLOOP 20 STRT BLU (SUTURE) IMPLANT
SUTURE FIBERWR #2 38 T-5 BLUE (SUTURE) IMPLANT
SUTURE TAPE TIGERLINK 1.3MM BL (SUTURE) ×1 IMPLANT
SUTURETAPE TIGERLINK 1.3MM BL (SUTURE) ×3
SYR 20CC LL (SYRINGE) ×6 IMPLANT
SYR 3ML LL SCALE MARK (SYRINGE) ×3 IMPLANT
SYR TB 1ML LUER SLIP (SYRINGE) ×3 IMPLANT
TOWEL OR 17X24 6PK STRL BLUE (TOWEL DISPOSABLE) ×3 IMPLANT
TOWEL OR 17X26 10 PK STRL BLUE (TOWEL DISPOSABLE) ×3 IMPLANT
WAND HAND CNTRL MULTIVAC 90 (MISCELLANEOUS) IMPLANT
WATER STERILE IRR 1000ML POUR (IV SOLUTION) IMPLANT

## 2017-12-09 NOTE — Anesthesia Postprocedure Evaluation (Signed)
Anesthesia Post Note  Patient: KEDARIUS ALOISI  Procedure(s) Performed: LEFT SHOULDER ARTHROSCOPY SUBACROMIAL DECOMPRESSION, ROTATOR CUFF TEAR REPAIR (Left Shoulder)     Patient location during evaluation: PACU Anesthesia Type: General and Regional Level of consciousness: awake and alert, oriented and patient cooperative Pain management: pain level controlled Vital Signs Assessment: post-procedure vital signs reviewed and stable Respiratory status: spontaneous breathing, nonlabored ventilation and respiratory function stable Cardiovascular status: blood pressure returned to baseline and stable Postop Assessment: no apparent nausea or vomiting Anesthetic complications: no    Last Vitals:  Vitals:   12/09/17 0945 12/09/17 1230  BP: (!) 145/88   Pulse: 75   Resp: (!) 21   Temp:  (!) 36.3 C  SpO2: 99%     Last Pain:  Vitals:   12/09/17 1230  TempSrc:   PainSc: 0-No pain                 Khaliya Golinski,E. Lori Popowski

## 2017-12-09 NOTE — Transfer of Care (Signed)
Immediate Anesthesia Transfer of Care Note  Patient: Steven Ortega  Procedure(s) Performed: LEFT SHOULDER ARTHROSCOPY SUBACROMIAL DECOMPRESSION, ROTATOR CUFF TEAR REPAIR (Left Shoulder)  Patient Location: PACU  Anesthesia Type:General  Level of Consciousness: awake  Airway & Oxygen Therapy: Patient Spontanous Breathing  Post-op Assessment: Report given to RN and Post -op Vital signs reviewed and stable  Post vital signs: Reviewed and stable  Last Vitals:  Vitals Value Taken Time  BP 132/81 12/09/2017 12:29 PM  Temp    Pulse 100 12/09/2017 12:31 PM  Resp 14 12/09/2017 12:31 PM  SpO2 96 % 12/09/2017 12:31 PM  Vitals shown include unvalidated device data.  Last Pain:  Vitals:   12/09/17 0752  TempSrc: Oral         Complications: No apparent anesthesia complications

## 2017-12-09 NOTE — H&P (Signed)
Steven Ortega is an 62 y.o. male.   Chief Complaint: Left shoulder pain HPI: Steven Ortega is a 62 year old patient with left shoulder pain.'s been going on for several months.  He has failed conservative management including medication including Norco for the past 3 months as well as injections.  MRI scan shows near full-thickness rotator cuff tear along with bursitis.  Patient has difficulty with ADLs as well as sleep.  Past Medical History:  Diagnosis Date  . Adenomatous colon polyp   . Anal fissure   . Anemia   . Anxiety   . Arthritis   . Constipation   . Depression   . Diverticulosis   . GERD (gastroesophageal reflux disease)   . Hearing aid worn    b/l  . HOH (hard of hearing)   . Hyperlipidemia   . Hypertension   . Hypothyroidism   . Kidney stones   . Kidney stones   . Pyloric erosion   . Wears dentures   . Wears glasses     Past Surgical History:  Procedure Laterality Date  . ANAL FISSURE REPAIR  2010  . APPENDECTOMY  1984  . COLONOSCOPY W/ BIOPSIES AND POLYPECTOMY    . HERNIA REPAIR    . LUMBAR FUSION  2013  . THUMB FUSION Left 2008  . THUMB FUSION Right 2008   x 2  . TRIGGER FINGER RELEASE Left 2008   2nd finger    Family History  Problem Relation Age of Onset  . Heart attack Brother        x 2  . Heart attack Father   . Lung cancer Father   . Colon cancer Neg Hx    Social History:  reports that he quit smoking about 19 years ago. His smoking use included cigarettes. He has never used smokeless tobacco. He reports that he drinks about 1.0 standard drinks of alcohol per week. He reports that he does not use drugs.  Allergies: No Known Allergies  Medications Prior to Admission  Medication Sig Dispense Refill  . acyclovir (ZOVIRAX) 800 MG tablet Take 400 mg by mouth daily as needed (fever blisters).     . ALPRAZolam (XANAX) 1 MG tablet Take 1 mg by mouth at bedtime.     Marland Kitchen buPROPion (ZYBAN) 150 MG 12 hr tablet Take 150 mg by mouth 2 (two) times daily.    .  Cholecalciferol 2000 UNITS CAPS Take 2,000 Units by mouth daily.     . citalopram (CELEXA) 40 MG tablet Take 40 mg by mouth daily.     . cyanocobalamin 500 MCG tablet Take 500 mcg by mouth 4 (four) times daily.     . cyclobenzaprine (FLEXERIL) 10 MG tablet Take 10 mg by mouth at bedtime.    . docusate sodium (STOOL SOFTENER) 100 MG capsule Take 100 mg by mouth daily.     . ferrous sulfate 325 (65 FE) MG tablet Take 325 mg by mouth daily with breakfast.    . gemfibrozil (LOPID) 600 MG tablet Take 600 mg by mouth daily.     . hydrochlorothiazide (HYDRODIURIL) 25 MG tablet Take 25 mg by mouth daily.    Marland Kitchen HYDROcodone-acetaminophen (NORCO/VICODIN) 5-325 MG tablet Take 1 tablet by mouth every 6 (six) hours as needed for moderate pain.    Marland Kitchen levothyroxine (SYNTHROID, LEVOTHROID) 25 MCG tablet Take 25 mcg by mouth daily before breakfast.    . Omega-3 Fatty Acids (FISH OIL) 1000 MG CAPS Take 1,000 mg by mouth 2 (two) times daily.     Marland Kitchen  omeprazole (PRILOSEC) 20 MG capsule Take 20 mg by mouth daily.    . polyethylene glycol (MIRALAX / GLYCOLAX) packet Take 17 g by mouth daily.    . sodium bicarbonate 325 MG tablet Take 325 mg by mouth 2 (two) times daily.    . tamsulosin (FLOMAX) 0.4 MG CAPS capsule Take 1 capsule (0.4 mg total) by mouth daily. 30 capsule 0  . traMADol (ULTRAM) 50 MG tablet Take 50 mg by mouth every 6 (six) hours as needed for moderate pain.      Results for orders placed or performed during the hospital encounter of 12/09/17 (from the past 48 hour(s))  Basic metabolic panel     Status: None   Collection Time: 12/09/17  7:27 AM  Result Value Ref Range   Sodium 135 135 - 145 mmol/L   Potassium 3.7 3.5 - 5.1 mmol/L   Chloride 106 98 - 111 mmol/L   CO2 23 22 - 32 mmol/L   Glucose, Bld 95 70 - 99 mg/dL   BUN 15 8 - 23 mg/dL   Creatinine, Ser 1.17 0.61 - 1.24 mg/dL   Calcium 9.2 8.9 - 10.3 mg/dL   GFR calc non Af Amer >60 >60 mL/min   GFR calc Af Amer >60 >60 mL/min    Comment:  (NOTE) The eGFR has been calculated using the CKD EPI equation. This calculation has not been validated in all clinical situations. eGFR's persistently <60 mL/min signify possible Chronic Kidney Disease.    Anion gap 6 5 - 15    Comment: Performed at [facility] [address], [place], [place] [address]  CBC     Status: Abnormal   Collection Time: 12/09/17  7:27 AM  Result Value Ref Range   WBC 4.7 4.0 - 10.5 K/uL   RBC 4.30 4.22 - 5.81 MIL/uL   Hemoglobin 12.8 (L) 13.0 - 17.0 g/dL   HCT 39.8 39.0 - 52.0 %   MCV 92.6 78.0 - 100.0 fL   MCH 29.8 26.0 - 34.0 pg   MCHC 32.2 30.0 - 36.0 g/dL   RDW 12.4 11.5 - 15.5 %   Platelets 253 150 - 400 K/uL    Comment: Performed at [facility], [place] [address] [place], [place] [address]   No results found.  Review of Systems  Musculoskeletal: Positive for joint pain.  All other systems reviewed and are negative.   Blood pressure 123/77, pulse 72, temperature 97.8 F (36.6 C), temperature source Oral, resp. rate 20, height 6' (1.829 m), weight 95.3 kg, SpO2 98 %. Physical Exam  Constitutional: He appears well-developed.  HENT:  Head: Normocephalic.  Eyes: Pupils are equal, round, and reactive to light.  Neck: Normal range of motion.  Cardiovascular: Normal rate.  Respiratory: Effort normal.  Neurological: He is alert.  Skin: Skin is warm.  Psychiatric: He has a normal mood and affect.  Evaluation of the left shoulder demonstrates positive impingement signs with only mild AC joint tenderness to direct palpation or with crossarm adduction.  Patient does have pretty reasonable rotator cuff strength but slightly more painful with supraspinatus testing on the left than the right.  No restriction of passive range of motion on awake examination.  Shoulder stability is good.  O'Brien's testing equivocal.  Assessment/Plan Impression is left shoulder bursitis refractory to nonoperative management with possible near full-thickness  rotator cuff tear.  Plan is arthroscopy subacromial decompression with evaluation of the biceps tendon and possible mini open rotator cuff tear repair.  Risk and benefits are discussed.  Include but are not limited to infection nerve vessel damage shoulder stiffness as well as incomplete pain relief and prolonged rehabilitation.  I would like to use CPM machine postoperatively for the patient for at least 2 weeks.  All questions answered.  Anderson Malta, MD 12/09/2017, 9:46 AM

## 2017-12-09 NOTE — Anesthesia Procedure Notes (Signed)
Anesthesia Regional Block: Interscalene brachial plexus block   Pre-Anesthetic Checklist: ,, timeout performed, Correct Patient, Correct Site, Correct Laterality, Correct Procedure, Correct Position, site marked, Risks and benefits discussed,  Surgical consent,  Pre-op evaluation,  At surgeon's request and post-op pain management  Laterality: Left and Upper  Prep: chloraprep       Needles:  Injection technique: Single-shot  Needle Type: Echogenic Stimulator Needle     Needle Length: 9cm  Needle Gauge: 21     Additional Needles:   Procedures:, nerve stimulator,,, ultrasound used (permanent image in chart),,,,   Nerve Stimulator or Paresthesia:  Response: thumb twitch, 0.4 mA, 0.1 ms,   Additional Responses:   Narrative:  Start time: 12/09/2017 9:39 AM End time: 12/09/2017 9:48 AM Injection made incrementally with aspirations every 5 mL.  Performed by: Personally  Anesthesiologist: Annye Asa, MD  Additional Notes: Pt identified in Holding room.  Monitors applied. Working IV access confirmed. Sterile prep, drape L clavicle and neck.  #21ga ECHOgenic PNS to thumb twitch at 0.64mA threshold with US guidance.  10cc 0.5% Bupivacaine with 10 cc Exparel injected incrementally after negative test dose.  Patient asymptomatic, VSS, no heme aspirated, tolerated well.  Jenita Seashore, MD

## 2017-12-09 NOTE — Op Note (Signed)
NAME: Steven Ortega, Steven Ortega MEDICAL RECORD [id number] ACCOUNT [id number] DATE OF BIRTH:10-Oct-1955 FACILITY: [facility] LOCATION: [facility] PHYSICIAN:Hagan Maltz Randel Pigg, MD  OPERATIVE REPORT  DATE OF PROCEDURE:  12/09/2017  PREOPERATIVE DIAGNOSIS:  Left shoulder bursitis and possible rotator cuff tear.  POSTOPERATIVE DIAGNOSIS:  Left shoulder bursitis and possible rotator cuff tear.  PROCEDURE:  Left shoulder bursitis with intra-articular rim and biceps tendon and rotator cuff tear of the infraspinatus tendon measuring full thickness about 8 x 8 mm.  PROCEDURE:  Left shoulder arthroscopy with superior labrum and biceps tendon debridement, subacromial decompression with acromioplasty and mini open rotator cuff tear repair.  SURGEON:  Meredith Pel, MD  ASSISTANT:  Dyke Brackett, RNFA.  INDICATIONS:  This is a 62 year old patient with significant left shoulder pain who presents for operative management after explanation of risks and benefits.  OPERATIVE FINDINGS: 1.  Examination under anesthesia:  The patient had about 80 degrees of external rotation at 15 degrees of abduction along with full forward flexion and abduction on the left hand side with 1+ anterior, posterior and less than a centimeter sign  instability. 2.  Diagnostic and operative arthroscopy:   The patient had a 5 mm full thickness tear of the infraspinatus just medial to its attachment on the greater tuberosity.  The glenohumeral articular surfaces on the glenoid side intact, but there was early  grade I chondromalacia over 20% of the humeral head.  The biceps tendon had torn by MRI scanning, but there was a remnant stump in the joint, which extended down towards the transverse humeral ligament.  PROCEDURE IN DETAIL:  The patient was brought to the operating room where general anesthetic was induced.  Preoperative antibiotics administered.  Timeout was called.  Left shoulder was prescrubbed with alcohol and Betadine, allowed to  air dry.  Prep  with DuraPrep solution and draped in a sterile manner.  Charlie Pitter was then used to cover the axilla and peripherally.  Posterior portal was created 2 cm medial and inferior to the posterolateral margin of the acromion.  Diagnostic arthroscopy was performed.   Anterior portal created under direct visualization.  The biceps tendon was tethered significantly medially.  It was not in its normal location.  Anterior, inferior, posterior inferior glenohumeral ligaments were intact and there was some mild early  chondromalacia on the humeral head.  At this time, anterior portal was utilized and debridement of the biceps tendon was performed.  It had already, auto tenodesed and it was removed from the intraarticular portion of the joint.  Superior labral  debridement was performed along with some early synovitis, but there was no significant rotator cuff restricted range of motion.  There was a full thickness tear of the infraspinatus, which was visualized on both the bursal side as well as the humeral  side.  Scope was then placed into the subacromial space and a lateral portal was created.  Diagnostic arthroscopy there removed the bursa and the tear was visualized.  This was a small tear nonretracted.  At this time, instruments were removed and the  portals were closed using 3-0 nylon.  Ioban then used to cover the entire operative field.  Incision made off the anterolateral margin of the acromion.  The deltoid was split between the anterior middle raphae and measured a distance of 4 cm.  The  rotator cuff tear was identified.  A curette was used to prepare the surface.  One Arthrex 5.5 corkscrew anchor was placed through and the 2 limbs were placed medially and  laterally to affect a side-to-side repair and then they were tied and then these  limbs were then placed into 1 PushLock suture anchor.  This gave a very stable fixation.  At this time,  arm was taken through range of motion, found to have no  impingement.  Thorough irrigation was performed.  Deltoid split was closed using a #1 Vicryl  suture followed by interrupted inverted 0 Vicryl suture, 2-0 Vicryl suture and a 3-0 Monocryl.  Impervious dressing was placed.  Sling placed.    The patient tolerated the procedure well without immediate complications, transferred to the recovery room in stable condition.  AN/NUANCE  D:12/09/2017 T:12/09/2017 JOB:[id number]

## 2017-12-09 NOTE — Anesthesia Preprocedure Evaluation (Addendum)
Anesthesia Evaluation  Patient identified by MRN, date of birth, ID band Patient awake    Reviewed: Allergy & Precautions, NPO status , Patient's Chart, lab work & pertinent test results  History of Anesthesia Complications Negative for: history of anesthetic complications  Airway Mallampati: I  TM Distance: >3 FB Neck ROM: Full    Dental  (+) Edentulous Upper, Edentulous Lower   Pulmonary former smoker (quit 2000),    breath sounds clear to auscultation       Cardiovascular hypertension, Pt. on medications (-) angina Rhythm:Regular Rate:Normal     Neuro/Psych Anxiety Depression negative neurological ROS     GI/Hepatic Neg liver ROS, GERD  Medicated and Controlled,  Endo/Other  Hypothyroidism   Renal/GU negative Renal ROS     Musculoskeletal   Abdominal   Peds  Hematology negative hematology ROS (+)   Anesthesia Other Findings   Reproductive/Obstetrics                            Anesthesia Physical Anesthesia Plan  ASA: II  Anesthesia Plan: General   Post-op Pain Management: GA combined w/ Regional for post-op pain   Induction: Intravenous  PONV Risk Score and Plan: 3 and Ondansetron and Dexamethasone  Airway Management Planned: Oral ETT  Additional Equipment:   Intra-op Plan:   Post-operative Plan: Extubation in OR  Informed Consent: I have reviewed the patients History and Physical, chart, labs and discussed the procedure including the risks, benefits and alternatives for the proposed anesthesia with the patient or authorized representative who has indicated his/her understanding and acceptance.   Dental advisory given  Plan Discussed with: CRNA and Surgeon  Anesthesia Plan Comments: (Plan routine monitors, GETA with interscalene block for post op analgesia)        Anesthesia Quick Evaluation

## 2017-12-09 NOTE — Anesthesia Procedure Notes (Signed)
Procedure Name: Intubation Date/Time: 12/09/2017 10:18 AM Performed by: Purvis Kilts, CRNA Pre-anesthesia Checklist: Patient identified, Emergency Drugs available, Patient being monitored, Suction available and Timeout performed Patient Re-evaluated:Patient Re-evaluated prior to induction Oxygen Delivery Method: Circle system utilized Preoxygenation: Pre-oxygenation with 100% oxygen Induction Type: IV induction Ventilation: Mask ventilation without difficulty Laryngoscope Size: Mac and 4 Grade View: Grade I Tube type: Oral Tube size: 7.5 mm Number of attempts: 1 Airway Equipment and Method: Stylet Placement Confirmation: positive ETCO2,  ETT inserted through vocal cords under direct vision and breath sounds checked- equal and bilateral Secured at: 21 cm Tube secured with: Tape Dental Injury: Teeth and Oropharynx as per pre-operative assessment

## 2017-12-09 NOTE — Brief Op Note (Signed)
12/09/2017  12:06 PM  PATIENT:  Steven Ortega  62 y.o. male  PRE-OPERATIVE DIAGNOSIS:  left shoulder bursitis  POST-OPERATIVE DIAGNOSIS:  left shoulder bursitis, left shoulder rotator cuff tear  PROCEDURE:  Procedure(s): LEFT SHOULDER ARTHROSCOPY SUBACROMIAL DECOMPRESSION, intraarticular debridement of biceps tendon ROTATOR CUFF TEAR REPAIR  SURGEON:  Surgeon(s): Marlou Sa, Tonna Corner, MD  ASSISTANT: Darlen Round RNFA  ANESTHESIA:   general  EBL: 15 ml    Total I/O In: 1000 [I.V.:1000] Out: -   BLOOD ADMINISTERED: none  DRAINS: none   LOCAL MEDICATIONS USED:  none  SPECIMEN:  No Specimen  COUNTS:  YES  TOURNIQUET:  * No tourniquets in log *  DICTATION: .Other Dictation: Dictation Number [id number]  PLAN OF CARE: Discharge to home after PACU  PATIENT DISPOSITION:  PACU - hemodynamically stable

## 2017-12-09 NOTE — Progress Notes (Signed)
Office Visit Note   Patient: Steven Ortega           Date of Birth: [birth year]           MRN: [id number] Visit Date: 12/04/2017 Requested by: Judeth Cornfield, PA-C [facility] [address] [place], [place] [address] PCP: Christain Sacramento, MD  Subjective: Chief Complaint  Patient presents with  . Left Shoulder - Pain    HPI: Steven Ortega is a former with left shoulder pain.  Denies any history of injury but reports pain for years.  He has had previous treatment at the [facility] without sustained relief.  MRIs have been done at the [facility] which are reviewed today.  On the left-hand side the patient has significant tendinosis of the supraspinatus and infraspinatus.  Biceps tendon is by report absent from the joint.  There is also bursitis and spurring.  Also AC joint arthritis.  On the right shoulder there is significant intra-articular biceps tendinosis with some subluxation out of the groove along with near full-thickness proximal subscap tearing and tendinosis of the supraspinatus.  Patient states that the pain is constant.  He is had injections and physical therapy without relief.  He is unable to sleep or lay on that left-hand side.              ROS: All systems reviewed are negative as they relate to the chief complaint within the history of present illness.  Patient denies  fevers or chills.   Assessment & Plan: Visit Diagnoses:  1. Nontraumatic incomplete tear of left rotator cuff     Plan: Impression is left shoulder pain with no neck pain or radiating symptoms into the arm.  He had injection and therapy without sustained relief.  I think he may have a full-thickness component of that rotator cuff tendinosis and partial tearing.  I think there is also possibility that the biceps may be symptomatic in terms of the stump which is left in the shoulder joint.  Plan at this time is left shoulder arthroscopy with subacromial decompression and evaluation of the intra-articular rotator cuff and extra-articular  rotator cuff for tearing.  His AC joint is nontender to palpation today and I do not think that will require any treatment although he does have some arthritis present.  The risk and benefits of surgery are discussed.  Include but not limited limited to infection nerve vessel damage shoulder stiffness and incomplete pain relief.  All questions answered.  Do want to use CPM machine for 2 weeks after surgery.  Follow-Up Instructions: No follow-ups on file.   Orders:  No orders of the defined types were placed in this encounter.  No orders of the defined types were placed in this encounter.     Procedures: No procedures performed   Clinical Data: No additional findings.  Objective: Vital Signs: There were no vitals taken for this visit.  Physical Exam:   Constitutional: Patient appears well-developed HEENT:  Head: Normocephalic Eyes:EOM are normal Neck: Normal range of motion Cardiovascular: Normal rate Pulmonary/chest: Effort normal Neurologic: Patient is alert Skin: Skin is warm Psychiatric: Patient has normal mood and affect    Ortho Exam: Ortho exam demonstrates some pain with left shoulder range of motion particularly impingement signs positive.  Strength is reasonable to intraspinous substernally subscap muscle testing.  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  Does have a little bit of coarseness with passive internal/external rotation of 15 degrees of abduction  on that left shoulder.  Specialty Comments:  No specialty comments available.  Imaging: No results found.   PMFS History: Patient Active Problem List   Diagnosis Date Noted  . Diverticulitis of colon (without mention of hemorrhage)(562.11) 10/13/2013  . Abdominal pain, left lower quadrant 10/13/2013  . ANEMIA DUE TO CHRONIC BLOOD LOSS 07/30/2008  . GASTRITIS, CHRONIC 06/29/2008  . COLONIC POLYPS 03/11/2008  . HYPERLIPIDEMIA 03/11/2008  . UNSPECIFIED ANEMIA 03/11/2008  .  ANXIETY 03/11/2008  . DEPRESSION 03/11/2008  . HYPERTENSION 03/11/2008  . DIVERTICULOSIS, COLON 03/11/2008  . ANAL FISSURE 03/11/2008  . CHRONIC KIDNEY DISEASE UNSPECIFIED 03/11/2008  . NEPHROLITHIASIS 03/11/2008  . CHEST PAIN UNSPECIFIED 03/11/2008  . LUQ PAIN 03/11/2008  . ARTHRITIS, HX OF 03/11/2008   Past Medical History:  Diagnosis Date  . Adenomatous colon polyp   . Anal fissure   . Anemia   . Anxiety   . Arthritis   . Constipation   . Depression   . Diverticulosis   . GERD (gastroesophageal reflux disease)   . Hearing aid worn    b/l  . HOH (hard of hearing)   . Hyperlipidemia   . Hypertension   . Hypothyroidism   . Kidney stones   . Kidney stones   . Pyloric erosion   . Wears dentures   . Wears glasses     Family History  Problem Relation Age of Onset  . Heart attack Brother        x 2  . Heart attack Father   . Lung cancer Father   . Colon cancer Neg Hx     Past Surgical History:  Procedure Laterality Date  . ANAL FISSURE REPAIR  2010  . APPENDECTOMY  1984  . COLONOSCOPY W/ BIOPSIES AND POLYPECTOMY    . HERNIA REPAIR    . LUMBAR FUSION  2013  . THUMB FUSION Left 2008  . THUMB FUSION Right 2008   x 2  . TRIGGER FINGER RELEASE Left 2008   2nd finger   Social History   Occupational History  . Occupation: retired  Tobacco Use  . Smoking status: Former Smoker    Types: Cigarettes    Last attempt to quit: 03/05/1998    Years since quitting: 19.7  . Smokeless tobacco: Never Used  Substance and Sexual Activity  . Alcohol use: Yes    Alcohol/week: 1.0 standard drinks    Types: 1 Cans of beer per week    Comment: occasional  . Drug use: No  . Sexual activity: Not on file

## 2017-12-10 ENCOUNTER — Encounter (HOSPITAL_COMMUNITY): Payer: Self-pay | Admitting: Orthopedic Surgery

## 2017-12-16 ENCOUNTER — Ambulatory Visit (INDEPENDENT_AMBULATORY_CARE_PROVIDER_SITE_OTHER): Payer: PRIVATE HEALTH INSURANCE | Admitting: Orthopedic Surgery

## 2017-12-16 ENCOUNTER — Encounter (INDEPENDENT_AMBULATORY_CARE_PROVIDER_SITE_OTHER): Payer: Self-pay | Admitting: Orthopedic Surgery

## 2017-12-16 DIAGNOSIS — M75112 Incomplete rotator cuff tear or rupture of left shoulder, not specified as traumatic: Secondary | ICD-10-CM

## 2017-12-16 NOTE — Progress Notes (Signed)
Post-Op Visit Note   Patient: Steven Ortega           Date of Birth: 10/22/1955           MRN: [id number] Visit Date: 12/16/2017 PCP: Steven Sacramento, MD   Assessment & Plan:  Chief Complaint:  Chief Complaint  Patient presents with  . Left Shoulder - Follow-up, Routine Post Op   Visit Diagnoses:  1. Nontraumatic incomplete tear of left rotator cuff     Plan: Steven Ortega is a patient who is now a week out left shoulder arthroscopy cuff repair.  He is been in the CPM machine.  Is doing well.  Shoulder range of motion is good.  I will have him spend 1 more week in the sling and come out of the sling.  No lifting with that arm and I will see him back in 2 weeks. Follow-Up Instructions: Return in about 2 weeks (around 12/30/2017).   Orders:  No orders of the defined types were placed in this encounter.  No orders of the defined types were placed in this encounter.   Imaging: No results found.  PMFS History: Patient Active Problem List   Diagnosis Date Noted  . Diverticulitis of colon (without mention of hemorrhage)(562.11) 10/13/2013  . Abdominal pain, left lower quadrant 10/13/2013  . ANEMIA DUE TO CHRONIC BLOOD LOSS 07/30/2008  . GASTRITIS, CHRONIC 06/29/2008  . COLONIC POLYPS 03/11/2008  . HYPERLIPIDEMIA 03/11/2008  . UNSPECIFIED ANEMIA 03/11/2008  . ANXIETY 03/11/2008  . DEPRESSION 03/11/2008  . HYPERTENSION 03/11/2008  . DIVERTICULOSIS, COLON 03/11/2008  . ANAL FISSURE 03/11/2008  . CHRONIC KIDNEY DISEASE UNSPECIFIED 03/11/2008  . NEPHROLITHIASIS 03/11/2008  . CHEST PAIN UNSPECIFIED 03/11/2008  . LUQ PAIN 03/11/2008  . ARTHRITIS, HX OF 03/11/2008   Past Medical History:  Diagnosis Date  . Adenomatous colon polyp   . Anal fissure   . Anemia   . Anxiety   . Arthritis   . Constipation   . Depression   . Diverticulosis   . GERD (gastroesophageal reflux disease)   . Hearing aid worn    b/l  . HOH (hard of hearing)   . Hyperlipidemia   . Hypertension   .  Hypothyroidism   . Kidney stones   . Kidney stones   . Pyloric erosion   . Wears dentures   . Wears glasses     Family History  Problem Relation Age of Onset  . Heart attack Brother        x 2  . Heart attack Father   . Lung cancer Father   . Colon cancer Neg Hx     Past Surgical History:  Procedure Laterality Date  . ANAL FISSURE REPAIR  2010  . APPENDECTOMY  1984  . COLONOSCOPY W/ BIOPSIES AND POLYPECTOMY    . HERNIA REPAIR    . LUMBAR FUSION  2013  . SHOULDER ARTHROSCOPY WITH SUBACROMIAL DECOMPRESSION, ROTATOR CUFF REPAIR AND BICEP TENDON REPAIR Left 12/09/2017   Procedure: LEFT SHOULDER ARTHROSCOPY SUBACROMIAL DECOMPRESSION, ROTATOR CUFF TEAR REPAIR;  Surgeon: Steven Pel, MD;  Location: [facility];  Service: Orthopedics;  Laterality: Left;  . THUMB FUSION Left 2008  . THUMB FUSION Right 2008   x 2  . TRIGGER FINGER RELEASE Left 2008   2nd finger   Social History   Occupational History  . Occupation: retired  Tobacco Use  . Smoking status: Former Smoker    Types: Cigarettes    Last attempt to quit: 03/05/1998  Years since quitting: 19.7  . Smokeless tobacco: Never Used  Substance and Sexual Activity  . Alcohol use: Yes    Alcohol/week: 1.0 standard drinks    Types: 1 Cans of beer per week    Comment: occasional  . Drug use: No  . Sexual activity: Not on file

## 2017-12-30 ENCOUNTER — Ambulatory Visit (INDEPENDENT_AMBULATORY_CARE_PROVIDER_SITE_OTHER): Payer: PRIVATE HEALTH INSURANCE | Admitting: Orthopedic Surgery

## 2017-12-30 ENCOUNTER — Encounter (INDEPENDENT_AMBULATORY_CARE_PROVIDER_SITE_OTHER): Payer: Self-pay | Admitting: Orthopedic Surgery

## 2017-12-30 DIAGNOSIS — M75112 Incomplete rotator cuff tear or rupture of left shoulder, not specified as traumatic: Secondary | ICD-10-CM

## 2017-12-30 NOTE — Progress Notes (Signed)
Office Visit Note   Patient: Steven Ortega           Date of Birth: 12-31-1955           MRN: [id number] Visit Date: 12/30/2017 Requested by: Steven Sacramento, MD [address] [place], [place] [address] PCP: Steven Sacramento, MD  Subjective: Chief Complaint  Patient presents with  . Left Shoulder - Routine Post Op    HPI: [name] presents for follow-up of left shoulder rotator cuff repair and subacromial decompression.  He is doing well with no problems.  Taking Tylenol as needed for pain.  CPM machine is over 100.  On exam he has very smooth supple range of motion and improving strength.  I want him to be careful about not doing any lifting around the house.  Start him in physical therapy.  I will see him back in 4 weeks for clinical recheck              ROS: See above  Assessment & Plan: Visit Diagnoses: No diagnosis found.  Plan: See above  Follow-Up Instructions: No follow-ups on file.   Orders:  No orders of the defined types were placed in this encounter.  No orders of the defined types were placed in this encounter.     Procedures: No procedures performed   Clinical Data: No additional findings.  Objective: Vital Signs: There were no vitals taken for this visit.  Physical Exam: See above  Ortho Exam: See above  Specialty Comments:  No specialty comments available.  Imaging: No results found.   PMFS History: Patient Active Problem List   Diagnosis Date Noted  . Diverticulitis of colon (without mention of hemorrhage)(562.11) 10/13/2013  . Abdominal pain, left lower quadrant 10/13/2013  . ANEMIA DUE TO CHRONIC BLOOD LOSS 07/30/2008  . GASTRITIS, CHRONIC 06/29/2008  . COLONIC POLYPS 03/11/2008  . HYPERLIPIDEMIA 03/11/2008  . UNSPECIFIED ANEMIA 03/11/2008  . ANXIETY 03/11/2008  . DEPRESSION 03/11/2008  . HYPERTENSION 03/11/2008  . DIVERTICULOSIS, COLON 03/11/2008  . ANAL FISSURE 03/11/2008  . CHRONIC KIDNEY DISEASE UNSPECIFIED 03/11/2008  .  NEPHROLITHIASIS 03/11/2008  . CHEST PAIN UNSPECIFIED 03/11/2008  . LUQ PAIN 03/11/2008  . ARTHRITIS, HX OF 03/11/2008   Past Medical History:  Diagnosis Date  . Adenomatous colon polyp   . Anal fissure   . Anemia   . Anxiety   . Arthritis   . Constipation   . Depression   . Diverticulosis   . GERD (gastroesophageal reflux disease)   . Hearing aid worn    b/l  . HOH (hard of hearing)   . Hyperlipidemia   . Hypertension   . Hypothyroidism   . Kidney stones   . Kidney stones   . Pyloric erosion   . Wears dentures   . Wears glasses     Family History  Problem Relation Age of Onset  . Heart attack Brother        x 2  . Heart attack Father   . Lung cancer Father   . Colon cancer Neg Hx     Past Surgical History:  Procedure Laterality Date  . ANAL FISSURE REPAIR  2010  . APPENDECTOMY  1984  . COLONOSCOPY W/ BIOPSIES AND POLYPECTOMY    . HERNIA REPAIR    . LUMBAR FUSION  2013  . SHOULDER ARTHROSCOPY WITH SUBACROMIAL DECOMPRESSION, ROTATOR CUFF REPAIR AND BICEP TENDON REPAIR Left 12/09/2017   Procedure: LEFT SHOULDER ARTHROSCOPY SUBACROMIAL DECOMPRESSION, ROTATOR CUFF TEAR REPAIR;  Surgeon: Steven Pel, MD;  Location: [facility];  Service: Orthopedics;  Laterality: Left;  . THUMB FUSION Left 2008  . THUMB FUSION Right 2008   x 2  . TRIGGER FINGER RELEASE Left 2008   2nd finger   Social History   Occupational History  . Occupation: retired  Tobacco Use  . Smoking status: Former Smoker    Types: Cigarettes    Last attempt to quit: 03/05/1998    Years since quitting: 19.8  . Smokeless tobacco: Never Used  Substance and Sexual Activity  . Alcohol use: Yes    Alcohol/week: 1.0 standard drinks    Types: 1 Cans of beer per week    Comment: occasional  . Drug use: No  . Sexual activity: Not on file

## 2018-01-29 ENCOUNTER — Ambulatory Visit (INDEPENDENT_AMBULATORY_CARE_PROVIDER_SITE_OTHER): Payer: PRIVATE HEALTH INSURANCE | Admitting: Orthopedic Surgery

## 2018-01-29 ENCOUNTER — Encounter (INDEPENDENT_AMBULATORY_CARE_PROVIDER_SITE_OTHER): Payer: Self-pay | Admitting: Orthopedic Surgery

## 2018-01-29 DIAGNOSIS — M7552 Bursitis of left shoulder: Secondary | ICD-10-CM

## 2018-02-03 ENCOUNTER — Telehealth: Payer: Self-pay | Admitting: Internal Medicine

## 2018-02-03 ENCOUNTER — Encounter (HOSPITAL_COMMUNITY): Payer: Self-pay | Admitting: *Deleted

## 2018-02-03 ENCOUNTER — Emergency Department (HOSPITAL_COMMUNITY)
Admission: EM | Admit: 2018-02-03 | Discharge: 2018-02-03 | Disposition: A | Discharge status: Home / Self Care | Payer: Medicare Other | Attending: Emergency Medicine | Admitting: Emergency Medicine

## 2018-02-03 ENCOUNTER — Emergency Department (HOSPITAL_COMMUNITY): Payer: Medicare Other

## 2018-02-03 ENCOUNTER — Encounter (INDEPENDENT_AMBULATORY_CARE_PROVIDER_SITE_OTHER): Payer: Self-pay | Admitting: Orthopedic Surgery

## 2018-02-03 DIAGNOSIS — I10 Essential (primary) hypertension: Secondary | ICD-10-CM | POA: Insufficient documentation

## 2018-02-03 DIAGNOSIS — R1011 Right upper quadrant pain: Secondary | ICD-10-CM | POA: Insufficient documentation

## 2018-02-03 DIAGNOSIS — R110 Nausea: Secondary | ICD-10-CM | POA: Insufficient documentation

## 2018-02-03 DIAGNOSIS — E039 Hypothyroidism, unspecified: Secondary | ICD-10-CM | POA: Insufficient documentation

## 2018-02-03 DIAGNOSIS — Z87891 Personal history of nicotine dependence: Secondary | ICD-10-CM | POA: Diagnosis not present

## 2018-02-03 DIAGNOSIS — Z87442 Personal history of urinary calculi: Secondary | ICD-10-CM | POA: Insufficient documentation

## 2018-02-03 DIAGNOSIS — Z79899 Other long term (current) drug therapy: Secondary | ICD-10-CM | POA: Diagnosis not present

## 2018-02-03 DIAGNOSIS — R1010 Upper abdominal pain, unspecified: Secondary | ICD-10-CM

## 2018-02-03 LAB — CBC WITH DIFFERENTIAL/PLATELET
ABS IMMATURE GRANULOCYTES: 0.03 10*3/uL (ref 0.00–0.07)
Basophils Absolute: 0.1 10*3/uL (ref 0.0–0.1)
Basophils Relative: 1 %
Eosinophils Absolute: 0.2 10*3/uL (ref 0.0–0.5)
Eosinophils Relative: 2 %
HCT: 41.6 % (ref 39.0–52.0)
HEMOGLOBIN: 12.9 g/dL — AB (ref 13.0–17.0)
IMMATURE GRANULOCYTES: 0 %
LYMPHS PCT: 22 %
Lymphs Abs: 1.6 10*3/uL (ref 0.7–4.0)
MCH: 27.4 pg (ref 26.0–34.0)
MCHC: 31 g/dL (ref 30.0–36.0)
MCV: 88.3 fL (ref 80.0–100.0)
MONO ABS: 0.9 10*3/uL (ref 0.1–1.0)
Monocytes Relative: 12 %
NEUTROS ABS: 4.8 10*3/uL (ref 1.7–7.7)
Neutrophils Relative %: 63 %
Platelets: 402 10*3/uL — ABNORMAL HIGH (ref 150–400)
RBC: 4.71 MIL/uL (ref 4.22–5.81)
RDW: 11.7 % (ref 11.5–15.5)
WBC: 7.5 10*3/uL (ref 4.0–10.5)
nRBC: 0 % (ref 0.0–0.2)

## 2018-02-03 LAB — COMPREHENSIVE METABOLIC PANEL
ALBUMIN: 4 g/dL (ref 3.5–5.0)
ALK PHOS: 59 U/L (ref 38–126)
ALT: 18 U/L (ref 0–44)
AST: 18 U/L (ref 15–41)
Anion gap: 12 (ref 5–15)
BUN: 16 mg/dL (ref 8–23)
CALCIUM: 9.4 mg/dL (ref 8.9–10.3)
CO2: 24 mmol/L (ref 22–32)
CREATININE: 1.27 mg/dL — AB (ref 0.61–1.24)
Chloride: 96 mmol/L — ABNORMAL LOW (ref 98–111)
GFR calc non Af Amer: 59 mL/min — ABNORMAL LOW (ref >60)
GLUCOSE: 124 mg/dL — AB (ref 70–99)
Potassium: 3.3 mmol/L — ABNORMAL LOW (ref 3.5–5.1)
SODIUM: 132 mmol/L — AB (ref 135–145)
Total Bilirubin: 0.5 mg/dL (ref 0.3–1.2)
Total Protein: 7.8 g/dL (ref 6.5–8.1)

## 2018-02-03 LAB — LIPASE, BLOOD: Lipase: 43 U/L (ref 11–51)

## 2018-02-03 MED ORDER — IOHEXOL 300 MG/ML  SOLN
100.0000 mL | Freq: Once | INTRAMUSCULAR | Status: AC | PRN
  Administered 2018-02-03: 100 mL via INTRAVENOUS

## 2018-02-03 MED ORDER — HYDROMORPHONE HCL 1 MG/ML IJ SOLN
1.0000 mg | Freq: Once | INTRAMUSCULAR | Status: AC
  Administered 2018-02-03: 1 mg via INTRAVENOUS
  Filled 2018-02-03: qty 1

## 2018-02-03 MED ORDER — PANTOPRAZOLE SODIUM 40 MG PO TBEC: 40.0000 mg | DELAYED_RELEASE_TABLET | Freq: Two times a day (BID) | ORAL | 3 refills | Status: DC

## 2018-02-03 MED ORDER — ONDANSETRON HCL 4 MG/2ML IJ SOLN
4.0000 mg | Freq: Once | INTRAMUSCULAR | Status: AC
  Administered 2018-02-03: 4 mg via INTRAVENOUS
  Filled 2018-02-03: qty 2

## 2018-02-03 MED ORDER — SODIUM CHLORIDE 0.9 % IV BOLUS
500.0000 mL | Freq: Once | INTRAVENOUS | Status: AC
  Administered 2018-02-03: 500 mL via INTRAVENOUS

## 2018-02-03 MED ORDER — SUCRALFATE 1 GM/10ML PO SUSP: 1.0000 g | Freq: Three times a day (TID) | ORAL | 0 refills | Status: DC

## 2018-02-03 MED ORDER — OXYCODONE-ACETAMINOPHEN 5-325 MG PO TABS: 1.0000 | ORAL_TABLET | ORAL | 0 refills | Status: DC | PRN

## 2018-02-03 NOTE — ED Provider Notes (Signed)
[facility] EMERGENCY DEPARTMENT Provider Note   CSN: [id number] Arrival date & time: 02/03/18  0109     History   Chief Complaint Chief Complaint  Patient presents with  . Abdominal Pain    HPI Steven Ortega is a 62 y.o. male.  Patient presents to the emergency department for evaluation of abdominal pain.  Patient reports that the pain is been ongoing for several days.  He does worsen when he eats so he has not been eating much.  Patient reports pain in the right upper abdomen, up under the ribs.  He radiates into his back.  He has had nausea but no vomiting.     Past Medical History:  Diagnosis Date  . Adenomatous colon polyp   . Anal fissure   . Anemia   . Anxiety   . Arthritis   . Constipation   . Depression   . Diverticulosis   . GERD (gastroesophageal reflux disease)   . Hearing aid worn    b/l  . HOH (hard of hearing)   . Hyperlipidemia   . Hypertension   . Hypothyroidism   . Kidney stones   . Kidney stones   . Pyloric erosion   . Wears dentures   . Wears glasses     Patient Active Problem List   Diagnosis Date Noted  . Diverticulitis of colon (without mention of hemorrhage)(562.11) 10/13/2013  . Abdominal pain, left lower quadrant 10/13/2013  . ANEMIA DUE TO CHRONIC BLOOD LOSS 07/30/2008  . GASTRITIS, CHRONIC 06/29/2008  . COLONIC POLYPS 03/11/2008  . HYPERLIPIDEMIA 03/11/2008  . UNSPECIFIED ANEMIA 03/11/2008  . ANXIETY 03/11/2008  . DEPRESSION 03/11/2008  . HYPERTENSION 03/11/2008  . DIVERTICULOSIS, COLON 03/11/2008  . ANAL FISSURE 03/11/2008  . CHRONIC KIDNEY DISEASE UNSPECIFIED 03/11/2008  . NEPHROLITHIASIS 03/11/2008  . CHEST PAIN UNSPECIFIED 03/11/2008  . LUQ PAIN 03/11/2008  . ARTHRITIS, HX OF 03/11/2008    Past Surgical History:  Procedure Laterality Date  . ANAL FISSURE REPAIR  2010  . APPENDECTOMY  1984  . COLONOSCOPY W/ BIOPSIES AND POLYPECTOMY    . HERNIA REPAIR    . LUMBAR FUSION  2013  . SHOULDER  ARTHROSCOPY WITH SUBACROMIAL DECOMPRESSION, ROTATOR CUFF REPAIR AND BICEP TENDON REPAIR Left 12/09/2017   Procedure: LEFT SHOULDER ARTHROSCOPY SUBACROMIAL DECOMPRESSION, ROTATOR CUFF TEAR REPAIR;  Surgeon: Meredith Pel, MD;  Location: [facility];  Service: Orthopedics;  Laterality: Left;  . THUMB FUSION Left 2008  . THUMB FUSION Right 2008   x 2  . TRIGGER FINGER RELEASE Left 2008   2nd finger        Home Medications    Prior to Admission medications   Medication Sig Start Date End Date Taking? Authorizing Provider  acyclovir (ZOVIRAX) 800 MG tablet Take 400 mg by mouth daily as needed (fever blisters).    Yes [provider]  ALPRAZolam Duanne Moron) 1 MG tablet Take 1 mg by mouth at bedtime.    Yes [provider]  buPROPion (ZYBAN) 150 MG 12 hr tablet Take 150 mg by mouth 2 (two) times daily.   Yes [provider]  Cholecalciferol 2000 UNITS CAPS Take 2,000 Units by mouth daily.    Yes [provider]  citalopram (CELEXA) 40 MG tablet Take 40 mg by mouth daily.    Yes [provider]  cyanocobalamin 500 MCG tablet Take 500 mcg by mouth 4 (four) times daily.    Yes [provider]  cyclobenzaprine (FLEXERIL) 10 MG tablet  Take 10 mg by mouth at bedtime.   Yes [provider]  docusate sodium (STOOL SOFTENER) 100 MG capsule Take 100 mg by mouth daily.    Yes [provider]  ferrous sulfate 325 (65 FE) MG tablet Take 325 mg by mouth daily with breakfast.   Yes [provider]  gemfibrozil (LOPID) 600 MG tablet Take 600 mg by mouth daily.    Yes [provider]  hydrochlorothiazide (HYDRODIURIL) 25 MG tablet Take 25 mg by mouth daily.   Yes [provider]  HYDROcodone-acetaminophen (NORCO) 10-325 MG tablet Take 1 tablet by mouth every 6 (six) hours as needed. for pain 12/09/17  Yes [provider]  levothyroxine (SYNTHROID, LEVOTHROID) 25 MCG tablet Take 25 mcg by mouth daily before  breakfast.   Yes [provider]  Omega-3 Fatty Acids (FISH OIL) 1000 MG CAPS Take 1,000 mg by mouth 2 (two) times daily.    Yes [provider]  polyethylene glycol (MIRALAX / GLYCOLAX) packet Take 17 g by mouth daily.   Yes [provider]  sodium bicarbonate 325 MG tablet Take 325 mg by mouth 2 (two) times daily.   Yes [provider]  tamsulosin (FLOMAX) 0.4 MG CAPS capsule Take 1 capsule (0.4 mg total) by mouth daily. 09/18/15  Yes Hedges, Dellis Filbert, PA-C  oxyCODONE-acetaminophen (PERCOCET) 5-325 MG tablet Take 1-2 tablets by mouth every 4 (four) hours as needed. 02/03/18   Orpah Greek, MD  pantoprazole (PROTONIX) 40 MG tablet Take 1 tablet (40 mg total) by mouth 2 (two) times daily before a meal. 02/03/18   , Gwenyth Allegra, MD  sucralfate (CARAFATE) 1 GM/10ML suspension Take 10 mLs (1 g total) by mouth 4 (four) times daily -  with meals and at bedtime. 02/03/18   Orpah Greek, MD    Family History Family History  Problem Relation Age of Onset  . Heart attack Brother        x 2  . Heart attack Father   . Lung cancer Father   . Colon cancer Neg Hx     Social History Social History   Tobacco Use  . Smoking status: Former Smoker    Types: Cigarettes    Last attempt to quit: 03/05/1998    Years since quitting: 19.9  . Smokeless tobacco: Never Used  Substance Use Topics  . Alcohol use: Yes    Alcohol/week: 1.0 standard drinks    Types: 1 Cans of beer per week    Comment: occasional  . Drug use: No     Allergies   Patient has no known allergies.   Review of Systems Review of Systems  Gastrointestinal: Positive for abdominal pain and nausea.  All other systems reviewed and are negative.    Physical Exam Updated Vital Signs BP (!) 158/94 (BP Location: Right Arm)   Pulse 100   Temp 98.1 F (36.7 C) (Oral)   Resp 18   SpO2 97%   Physical Exam  Constitutional: He is oriented to person, place, and time. He  appears well-developed and well-nourished. No distress.  HENT:  Head: Normocephalic and atraumatic.  Right Ear: Hearing normal.  Left Ear: Hearing normal.  Nose: Nose normal.  Mouth/Throat: Oropharynx is clear and moist and mucous membranes are normal.  Eyes: Pupils are equal, round, and reactive to light. Conjunctivae and EOM are normal.  Neck: Normal range of motion. Neck supple.  Cardiovascular: Regular rhythm, S1 normal and S2 normal. Exam reveals no gallop and no  friction rub.  No murmur heard. Pulmonary/Chest: Effort normal and breath sounds normal. No respiratory distress. He exhibits no tenderness.  Abdominal: Soft. Normal appearance and bowel sounds are normal. There is no hepatosplenomegaly. There is tenderness in the right upper quadrant. There is no rebound, no guarding, no tenderness at McBurney's point and negative Murphy's sign. No hernia.  Musculoskeletal: Normal range of motion.  Neurological: He is alert and oriented to person, place, and time. He has normal strength. No cranial nerve deficit or sensory deficit. Coordination normal. GCS eye subscore is 4. GCS verbal subscore is 5. GCS motor subscore is 6.  Skin: Skin is warm, dry and intact. No rash noted. No cyanosis.  Psychiatric: He has a normal mood and affect. His speech is normal and behavior is normal. Thought content normal.  Nursing note and vitals reviewed.    ED Treatments / Results  Labs (all labs ordered are listed, but only abnormal results are displayed) Labs Reviewed  CBC WITH DIFFERENTIAL/PLATELET - Abnormal; Notable for the following components:      Result Value   Hemoglobin 12.9 (*)    Platelets 402 (*)    All other components within normal limits  COMPREHENSIVE METABOLIC PANEL - Abnormal; Notable for the following components:   Sodium 132 (*)    Potassium 3.3 (*)    Chloride 96 (*)    Glucose, Bld 124 (*)    Creatinine, Ser 1.27 (*)    GFR calc non Af Amer 59 (*)    All other components  within normal limits  LIPASE, BLOOD    EKG None  Radiology Ct Abdomen Pelvis W Contrast  Result Date: 02/03/2018 CLINICAL DATA:  62 year old male with abdominal pain. EXAM: CT ABDOMEN AND PELVIS WITH CONTRAST TECHNIQUE: Multidetector CT imaging of the abdomen and pelvis was performed using the standard protocol following bolus administration of intravenous contrast. CONTRAST:  140mL OMNIPAQUE IOHEXOL 300 MG/ML  SOLN COMPARISON:  Abdominal ultrasound dated 02/03/2018 and CT dated 09/18/2015 FINDINGS: Lower chest: Minimal bibasilar dependent atelectatic changes. There is coronary vascular calcification. No intra-abdominal free air. Trace free fluid within pelvis and diffuse stranding of the mesentery in the upper abdomen. Hepatobiliary: Probable mild fatty infiltration of the liver. No intrahepatic biliary ductal dilatation. The gallbladder is partially contracted. There is a stone in the neck of the gallbladder. A punctate calculus noted along the gallbladder fundus. Focal 18 x 8 mm nodular density along the inferior aspect of the gallbladder wall appears similar to prior CT of indeterminate etiology. This appears external to the gallbladder lumen and may represent a focal area of adenomyomatosis, or a lymph node, or an area of scarring. Pancreas: Stranding of the mesentery in the upper abdomen likely related to infiltrative process involving the stomach. No definite evidence of acute pancreatitis. Correlation with pancreatic enzymes recommended. Spleen: Normal in size without focal abnormality. Adrenals/Urinary Tract: The adrenal glands are unremarkable. Multiple bilateral nonobstructing renal calculi measure up to 4-5 mm. There is no hydronephrosis on either side. The visualized ureters and urinary bladder appear unremarkable. Stomach/Bowel: There is thickening of the distal stomach predominantly in the region of the gastric antrum and pylorus with mild thickening along the lesser curvature of the stomach  concerning for an infiltrative neoplasm. Further evaluation with endoscopy and tissue sampling recommended. There is associated narrowing of the lumen of the distal stomach. There is stranding of the mesentery adjacent to the stomach with a mildly rounded 10 mm lymph node adjacent to the distal stomach. There is  no evidence of small-bowel obstruction. There is sigmoid diverticulosis without active inflammatory changes. Appendectomy. Vascular/Lymphatic: Mild aortoiliac atherosclerotic disease. No portal venous gas. No retroperitoneal adenopathy. Reproductive: The prostate and seminal vesicles are grossly unremarkable. Other: None Musculoskeletal: Posterior lumbar fixation hardware. No acute osseous pathology. IMPRESSION: 1. Thickened appearance of the distal stomach and pylorus concerning for an infiltrative neoplasm. Further evaluation with endoscopy and tissue sampling recommended. There is stranding of the mesentery in the upper abdomen likely related to infiltrative process involving the stomach. 2. Cholelithiasis. 3. Bilateral nonobstructing renal calculi. No hydronephrosis. 4. Sigmoid diverticulosis without active inflammatory changes. Electronically Signed   By: Anner Crete M.D.   On: 02/03/2018 04:36   US Abdomen Limited Ruq  Result Date: 02/03/2018 CLINICAL DATA:  62 year old male with right upper quadrant abdominal pain. EXAM: ULTRASOUND ABDOMEN LIMITED RIGHT UPPER QUADRANT COMPARISON:  CT dated 09/18/2015 FINDINGS: Gallbladder: The gallbladder is partially contracted. A measured linear echogenic area in the region of the gallbladder likely represents the gallbladder wall. No definite stone, however evaluation is limited due to suboptimal visualization. No gallbladder wall thickening or pericholecystic fluid. Negative sonographic Murphy's sign. Common bile duct: Diameter: 3 mm Liver: Diffuse increased liver echogenicity. Portal vein is patent on color Doppler imaging with normal direction of blood  flow towards the liver. IMPRESSION: 1. No definite gallstone or sonographic evidence of acute cholecystitis. 2. Fatty liver. Electronically Signed   By: Anner Crete M.D.   On: 02/03/2018 02:26    Procedures Procedures (including critical care time)  Medications Ordered in ED Medications  sodium chloride 0.9 % bolus 500 mL (500 mLs Intravenous New Bag/Given 02/03/18 0447)  ondansetron (ZOFRAN) injection 4 mg (4 mg Intravenous Given 02/03/18 0447)  HYDROmorphone (DILAUDID) injection 1 mg (1 mg Intravenous Given 02/03/18 0449)  iohexol (OMNIPAQUE) 300 MG/ML solution 100 mL (100 mLs Intravenous Contrast Given 02/03/18 0412)     Initial Impression / Assessment and Plan / ED Course  I have reviewed the triage vital signs and the nursing notes.  Pertinent labs & imaging results that were available during my care of the patient were reviewed by me and considered in my medical decision making (see chart for details).     Patient presents to the emergency department for evaluation of abdominal pain.  Patient having persistent upper abdominal pain, mostly right-sided under the ribs.  He did have some mild tenderness in this area.  Gallbladder disease was considered a [place] possibility.  Lab work was entirely unremarkable other than slight anemia.  Gallbladder ultrasound was normal, no evidence of gallbladder disease.  Patient therefore underwent CT scan.  There is thickening of the stomach that is concerning for an infiltrative process.  This was discussed with the patient, he understands that he will need follow-up.  He reports that he has seen [facility] gastroenterology in the past for his colonoscopies.  He will call today to schedule follow-up for upper endoscopy.  Patient takes Norco at night.  He will be given a prescription for Percocet to be used instead as needed for his increased pain.  We will also aggressively treat for the possibility of peptic ulcer disease/gastritis causing his CT  findings.  Final Clinical Impressions(s) / ED Diagnoses   Final diagnoses:  Pain of upper abdomen    ED Discharge Orders         Ordered    oxyCODONE-acetaminophen (PERCOCET) 5-325 MG tablet  Every 4 hours PRN     02/03/18 0533    pantoprazole (PROTONIX) 40  MG tablet  2 times daily before meals     02/03/18 0533    sucralfate (CARAFATE) 1 GM/10ML suspension  3 times daily with meals & bedtime     02/03/18 0533           Orpah Greek, MD 02/03/18 [phone]

## 2018-02-03 NOTE — Telephone Encounter (Signed)
Left message for patient to call back  

## 2018-02-03 NOTE — Telephone Encounter (Signed)
Patient is scheduled for an office visit to discuss on 02/06/18 1:30 with Nicoletta Ba PA

## 2018-02-03 NOTE — Progress Notes (Signed)
Post-Op Visit Note   Patient: Steven Ortega           Date of Birth: 11-03-1955           MRN: [id number] Visit Date: 01/29/2018 PCP: Christain Sacramento, MD   Assessment & Plan:  Chief Complaint:  Chief Complaint  Patient presents with  . Left Shoulder - Pain   Visit Diagnoses:  1. Bursitis of left shoulder     Plan: [name] is a patient who underwent left shoulder arthroscopy 12/09/2017.  He is doing well with no pain.  He is doing therapy twice a week.  On exam he is making good progress with range of motion.  At this time I would like him to begin gentle strengthening exercises.  I cautioned him not to lift too much weight with that left arm.  We will see him back in 6 weeks to make sure his functional with that left shoulder before considering intervention on the right shoulder.  Follow-Up Instructions: Return in about 6 weeks (around 03/12/2018).   Orders:  No orders of the defined types were placed in this encounter.  No orders of the defined types were placed in this encounter.   Imaging: Ct Abdomen Pelvis W Contrast  Result Date: 02/03/2018 CLINICAL DATA:  62 year old male with abdominal pain. EXAM: CT ABDOMEN AND PELVIS WITH CONTRAST TECHNIQUE: Multidetector CT imaging of the abdomen and pelvis was performed using the standard protocol following bolus administration of intravenous contrast. CONTRAST:  193mL OMNIPAQUE IOHEXOL 300 MG/ML  SOLN COMPARISON:  Abdominal ultrasound dated 02/03/2018 and CT dated 09/18/2015 FINDINGS: Lower chest: Minimal bibasilar dependent atelectatic changes. There is coronary vascular calcification. No intra-abdominal free air. Trace free fluid within pelvis and diffuse stranding of the mesentery in the upper abdomen. Hepatobiliary: Probable mild fatty infiltration of the liver. No intrahepatic biliary ductal dilatation. The gallbladder is partially contracted. There is a stone in the neck of the gallbladder. A punctate calculus noted along the  gallbladder fundus. Focal 18 x 8 mm nodular density along the inferior aspect of the gallbladder wall appears similar to prior CT of indeterminate etiology. This appears external to the gallbladder lumen and may represent a focal area of adenomyomatosis, or a lymph node, or an area of scarring. Pancreas: Stranding of the mesentery in the upper abdomen likely related to infiltrative process involving the stomach. No definite evidence of acute pancreatitis. Correlation with pancreatic enzymes recommended. Spleen: Normal in size without focal abnormality. Adrenals/Urinary Tract: The adrenal glands are unremarkable. Multiple bilateral nonobstructing renal calculi measure up to 4-5 mm. There is no hydronephrosis on either side. The visualized ureters and urinary bladder appear unremarkable. Stomach/Bowel: There is thickening of the distal stomach predominantly in the region of the gastric antrum and pylorus with mild thickening along the lesser curvature of the stomach concerning for an infiltrative neoplasm. Further evaluation with endoscopy and tissue sampling recommended. There is associated narrowing of the lumen of the distal stomach. There is stranding of the mesentery adjacent to the stomach with a mildly rounded 10 mm lymph node adjacent to the distal stomach. There is no evidence of small-bowel obstruction. There is sigmoid diverticulosis without active inflammatory changes. Appendectomy. Vascular/Lymphatic: Mild aortoiliac atherosclerotic disease. No portal venous gas. No retroperitoneal adenopathy. Reproductive: The prostate and seminal vesicles are grossly unremarkable. Other: None Musculoskeletal: Posterior lumbar fixation hardware. No acute osseous pathology. IMPRESSION: 1. Thickened appearance of the distal stomach and pylorus concerning for an infiltrative neoplasm. Further evaluation with endoscopy and  tissue sampling recommended. There is stranding of the mesentery in the upper abdomen likely related to  infiltrative process involving the stomach. 2. Cholelithiasis. 3. Bilateral nonobstructing renal calculi. No hydronephrosis. 4. Sigmoid diverticulosis without active inflammatory changes. Electronically Signed   By: Anner Crete M.D.   On: 02/03/2018 04:36   US Abdomen Limited Ruq  Result Date: 02/03/2018 CLINICAL DATA:  62 year old male with right upper quadrant abdominal pain. EXAM: ULTRASOUND ABDOMEN LIMITED RIGHT UPPER QUADRANT COMPARISON:  CT dated 09/18/2015 FINDINGS: Gallbladder: The gallbladder is partially contracted. A measured linear echogenic area in the region of the gallbladder likely represents the gallbladder wall. No definite stone, however evaluation is limited due to suboptimal visualization. No gallbladder wall thickening or pericholecystic fluid. Negative sonographic Murphy's sign. Common bile duct: Diameter: 3 mm Liver: Diffuse increased liver echogenicity. Portal vein is patent on color Doppler imaging with normal direction of blood flow towards the liver. IMPRESSION: 1. No definite gallstone or sonographic evidence of acute cholecystitis. 2. Fatty liver. Electronically Signed   By: Anner Crete M.D.   On: 02/03/2018 02:26    PMFS History: Patient Active Problem List   Diagnosis Date Noted  . Diverticulitis of colon (without mention of hemorrhage)(562.11) 10/13/2013  . Abdominal pain, left lower quadrant 10/13/2013  . ANEMIA DUE TO CHRONIC BLOOD LOSS 07/30/2008  . GASTRITIS, CHRONIC 06/29/2008  . COLONIC POLYPS 03/11/2008  . HYPERLIPIDEMIA 03/11/2008  . UNSPECIFIED ANEMIA 03/11/2008  . ANXIETY 03/11/2008  . DEPRESSION 03/11/2008  . HYPERTENSION 03/11/2008  . DIVERTICULOSIS, COLON 03/11/2008  . ANAL FISSURE 03/11/2008  . CHRONIC KIDNEY DISEASE UNSPECIFIED 03/11/2008  . NEPHROLITHIASIS 03/11/2008  . CHEST PAIN UNSPECIFIED 03/11/2008  . LUQ PAIN 03/11/2008  . ARTHRITIS, HX OF 03/11/2008   Past Medical History:  Diagnosis Date  . Adenomatous colon polyp   .  Anal fissure   . Anemia   . Anxiety   . Arthritis   . Constipation   . Depression   . Diverticulosis   . GERD (gastroesophageal reflux disease)   . Hearing aid worn    b/l  . HOH (hard of hearing)   . Hyperlipidemia   . Hypertension   . Hypothyroidism   . Kidney stones   . Kidney stones   . Pyloric erosion   . Wears dentures   . Wears glasses     Family History  Problem Relation Age of Onset  . Heart attack Brother        x 2  . Heart attack Father   . Lung cancer Father   . Colon cancer Neg Hx     Past Surgical History:  Procedure Laterality Date  . ANAL FISSURE REPAIR  2010  . APPENDECTOMY  1984  . COLONOSCOPY W/ BIOPSIES AND POLYPECTOMY    . HERNIA REPAIR    . LUMBAR FUSION  2013  . SHOULDER ARTHROSCOPY WITH SUBACROMIAL DECOMPRESSION, ROTATOR CUFF REPAIR AND BICEP TENDON REPAIR Left 12/09/2017   Procedure: LEFT SHOULDER ARTHROSCOPY SUBACROMIAL DECOMPRESSION, ROTATOR CUFF TEAR REPAIR;  Surgeon: Meredith Pel, MD;  Location: [facility];  Service: Orthopedics;  Laterality: Left;  . THUMB FUSION Left 2008  . THUMB FUSION Right 2008   x 2  . TRIGGER FINGER RELEASE Left 2008   2nd finger   Social History   Occupational History  . Occupation: retired  Tobacco Use  . Smoking status: Former Smoker    Types: Cigarettes    Last attempt to quit: 03/05/1998    Years since quitting: 19.9  .  Smokeless tobacco: Never Used  Substance and Sexual Activity  . Alcohol use: Yes    Alcohol/week: 1.0 standard drinks    Types: 1 Cans of beer per week    Comment: occasional  . Drug use: No  . Sexual activity: Not on file

## 2018-02-03 NOTE — ED Triage Notes (Signed)
To ED for eval of abd pain since before Thanksgiving. States he has had little appetite. Nausea at times but no vomiting. BMs wnl. No cp. States he feels a 'ball in my stomach'.

## 2018-02-03 NOTE — Discharge Instructions (Addendum)
Do not take the hydrocodone if you are taking the oxycodone that I have prescribed for you today.  The oxycodone is stronger.  Call your gastroenterologist today to schedule a follow-up appointment for as soon as possible so they can perform endoscopy.

## 2018-02-06 ENCOUNTER — Encounter: Payer: Self-pay | Admitting: Physician Assistant

## 2018-02-06 ENCOUNTER — Ambulatory Visit (INDEPENDENT_AMBULATORY_CARE_PROVIDER_SITE_OTHER): Payer: Medicare Other | Admitting: Physician Assistant

## 2018-02-06 VITALS — BP 110/70 | HR 112 | Ht 71.0 in | Wt 223.0 lb

## 2018-02-06 DIAGNOSIS — R110 Nausea: Secondary | ICD-10-CM | POA: Diagnosis not present

## 2018-02-06 DIAGNOSIS — R9389 Abnormal findings on diagnostic imaging of other specified body structures: Secondary | ICD-10-CM | POA: Diagnosis not present

## 2018-02-06 DIAGNOSIS — R1013 Epigastric pain: Secondary | ICD-10-CM | POA: Diagnosis not present

## 2018-02-06 MED ORDER — SUCRALFATE 1 GM/10ML PO SUSP: 1.0000 g | Freq: Three times a day (TID) | ORAL | 0 refills | Status: DC

## 2018-02-06 MED ORDER — PANTOPRAZOLE SODIUM 40 MG PO TBEC: 40.0000 mg | DELAYED_RELEASE_TABLET | Freq: Two times a day (BID) | ORAL | 3 refills | Status: AC

## 2018-02-06 MED ORDER — OXYCODONE-ACETAMINOPHEN 5-325 MG PO TABS: 1.0000 | ORAL_TABLET | ORAL | 0 refills | Status: DC | PRN

## 2018-02-06 NOTE — Progress Notes (Signed)
Subjective:    Patient ID: Steven Ortega, male    DOB: 11/22/1955, 62 y.o.   MRN: [id number]  HPI; Steven Ortega is a pleasant 62 year old white male, known to Dr. Carlean Purl and last seen in our office in 2015.  He comes in today after ER visit on 02/03/2018 with complaints of upper abdominal pain. Patient has history of hypertension, chronic kidney disease, diverticulosis, GERD, and anxiety/depression. He last had colonoscopy in August 2015 with finding of severe sigmoid diverticulosis and otherwise negative exam with no colon polyps.  He had a very remote EGD in January 2010 which showed antral erosions.  Biopsies were benign and negative for H. pylori. Says his current symptoms started a little over a month ago and has gradually gotten to the point where he is having symptoms every day.  His pain worsened earlier this week prompting him to go to the emergency room.  He has had associated nausea and intermittent episodes of vomiting just a small amount of partially digested food.  His appetite has been decreased and he has been eating very small amounts.  Has developed a lot of burping.  Denies any heartburn or dysphasia.  He complains of pain in the epigastrium and eating some more toward the right upper quadrant and being fairly constant.  He says he feels sore all over his upper abdomen. He had not been taking any regular aspirin or NSAIDs. Eval in the emergency room showed hemoglobin of 12.9, potassium 3.3, creatinine 1.27, LFTs within normal limits. CT of the abdomen and pelvis on 02/03/2018 showed a trace amount of free fluid in the pelvis and diffuse mesenteric stranding.  There was a single gallstone noted in the neck of the gallbladder but no gallbladder wall thickening or ductal dilation.  There was an 18 x 8 mm density along the inferior gallbladder wall question area of adenomyomatosis. Also noted thickening of the distal stomach at the antrum and pylorus concerning for an infiltrative process  There was narrowing of the lumen in the distal stomach, 110 mm node adjacent to the stomach, no definite pancreatitis noted. He was started on Protonix 40 mg twice daily, Carafate liquid and given a prescription for hydrocodone.  He has been taking all of the medicines and says the hydrocodone definitely helps his pain and his been helping him be able to sleep some.  He also finds the Carafate somewhat soothing to his stomach.  No documented weight loss..   Review of Systems Pertinent positive and negative review of systems were noted in the above HPI section.  All other review of systems was otherwise negative.  Outpatient Encounter Medications as of 02/06/2018  Medication Sig  . acyclovir (ZOVIRAX) 800 MG tablet Take 400 mg by mouth daily as needed (fever blisters).   . ALPRAZolam (XANAX) 1 MG tablet Take 1 mg by mouth at bedtime.   Marland Kitchen buPROPion (ZYBAN) 150 MG 12 hr tablet Take 150 mg by mouth 2 (two) times daily.  . Cholecalciferol 2000 UNITS CAPS Take 2,000 Units by mouth daily.   . citalopram (CELEXA) 40 MG tablet Take 40 mg by mouth daily.   . cyanocobalamin 500 MCG tablet Take 500 mcg by mouth 4 (four) times daily.   . cyclobenzaprine (FLEXERIL) 10 MG tablet Take 10 mg by mouth at bedtime.  . docusate sodium (STOOL SOFTENER) 100 MG capsule Take 100 mg by mouth daily.   . ferrous sulfate 325 (65 FE) MG tablet Take 325 mg by mouth daily with breakfast.  .  gemfibrozil (LOPID) 600 MG tablet Take 600 mg by mouth daily.   . hydrochlorothiazide (HYDRODIURIL) 25 MG tablet Take 25 mg by mouth daily.  Marland Kitchen levothyroxine (SYNTHROID, LEVOTHROID) 25 MCG tablet Take 25 mcg by mouth daily before breakfast.  . Omega-3 Fatty Acids (FISH OIL) 1000 MG CAPS Take 1,000 mg by mouth 2 (two) times daily.   Marland Kitchen oxyCODONE-acetaminophen (PERCOCET) 5-325 MG tablet Take 1 tablet by mouth every 4 (four) hours as needed (May take one every 4-6 hours as needed).  . pantoprazole (PROTONIX) 40 MG tablet Take 1 tablet (40 mg  total) by mouth 2 (two) times daily before a meal.  . polyethylene glycol (MIRALAX / GLYCOLAX) packet Take 17 g by mouth daily.  . sodium bicarbonate 325 MG tablet Take 325 mg by mouth 2 (two) times daily.  . sucralfate (CARAFATE) 1 GM/10ML suspension Take 10 mLs (1 g total) by mouth 4 (four) times daily -  with meals and at bedtime.  . tamsulosin (FLOMAX) 0.4 MG CAPS capsule Take 1 capsule (0.4 mg total) by mouth daily.  . [DISCONTINUED] oxyCODONE-acetaminophen (PERCOCET) 5-325 MG tablet Take 1-2 tablets by mouth every 4 (four) hours as needed.  . [DISCONTINUED] pantoprazole (PROTONIX) 40 MG tablet Take 1 tablet (40 mg total) by mouth 2 (two) times daily before a meal.  . [DISCONTINUED] sucralfate (CARAFATE) 1 GM/10ML suspension Take 10 mLs (1 g total) by mouth 4 (four) times daily -  with meals and at bedtime.  . [DISCONTINUED] HYDROcodone-acetaminophen (NORCO) 10-325 MG tablet Take 1 tablet by mouth every 6 (six) hours as needed. for pain   No facility-administered encounter medications on file as of 02/06/2018.    No Known Allergies Patient Active Problem List   Diagnosis Date Noted  . Diverticulitis of colon (without mention of hemorrhage)(562.11) 10/13/2013  . Abdominal pain, left lower quadrant 10/13/2013  . ANEMIA DUE TO CHRONIC BLOOD LOSS 07/30/2008  . GASTRITIS, CHRONIC 06/29/2008  . COLONIC POLYPS 03/11/2008  . HYPERLIPIDEMIA 03/11/2008  . UNSPECIFIED ANEMIA 03/11/2008  . ANXIETY 03/11/2008  . DEPRESSION 03/11/2008  . HYPERTENSION 03/11/2008  . DIVERTICULOSIS, COLON 03/11/2008  . ANAL FISSURE 03/11/2008  . CHRONIC KIDNEY DISEASE UNSPECIFIED 03/11/2008  . NEPHROLITHIASIS 03/11/2008  . CHEST PAIN UNSPECIFIED 03/11/2008  . LUQ PAIN 03/11/2008  . ARTHRITIS, HX OF 03/11/2008   Social History   Socioeconomic History  . Marital status: Married    Spouse name: Not on file  . Number of children: 2  . Years of education: Not on file  . Highest education level: Not on file    Occupational History  . Occupation: retired  Scientific laboratory technician  . Financial resource strain: Not on file  . Food insecurity:    Worry: Not on file    Inability: Not on file  . Transportation needs:    Medical: Not on file    Non-medical: Not on file  Tobacco Use  . Smoking status: Former Smoker    Types: Cigarettes    Last attempt to quit: 03/05/1998    Years since quitting: 19.9  . Smokeless tobacco: Never Used  Substance and Sexual Activity  . Alcohol use: Yes    Alcohol/week: 1.0 standard drinks    Types: 1 Cans of beer per week    Comment: occasional  . Drug use: No  . Sexual activity: Not on file  Lifestyle  . Physical activity:    Days per week: Not on file    Minutes per session: Not on file  . Stress:  Not on file  Relationships  . Social connections:    Talks on phone: Not on file    Gets together: Not on file    Attends religious service: Not on file    Active member of club or organization: Not on file    Attends meetings of clubs or organizations: Not on file    Relationship status: Not on file  . Intimate partner violence:    Fear of current or ex partner: Not on file    Emotionally abused: Not on file    Physically abused: Not on file    Forced sexual activity: Not on file  Other Topics Concern  . Not on file  Social History Narrative  . Not on file    Mr. Paulsen family history includes Heart attack in his brother and father; Lung cancer in his father.      Objective:    Vitals:   02/06/18 1317  BP: 110/70  Pulse: (!) 112    Physical Exam; well-developed older white male in no acute distress, accompanied by his wife, both very pleasant.  Blood pressure 110/70 pulse 112, BMI 31.1.  HEENT; nontraumatic normocephalic EOMI PERRLA sclera anicteric buccal mucosa moist, Cardiovascular ;regular rate and rhythm with S1-S2 no murmur rub or gallop, Pulmonary ;clear bilaterally, Abdomen; soft, bowel sounds are present, no succussion splash.  He is tender  across the epigastrium and hypogastrium, no definite palpable mass or hepatosplenomegaly though there is some fullness of the upper abdomen.  Rectal ;exam not done, Extremities; no clubbing cyanosis or edema skin warm and dry, Neuro/ psych ;alert and oriented, grossly nonfocal mood and affect appropriate       Assessment & Plan:   #35 62 year old white male with 4 to 5-week history of persistent now constant epigastric abdominal pain, intermittent nausea and with small-volume emesis, decreased oral intake, and abnormal CT imaging [place] for an infiltrative process of the distal stomach at the antrum/pylorus with some luminal narrowing Also noted a 10 mm node adjacent to the stomach and diffuse mesenteric stranding.  Rule out gastric cancer with mesenteric involvement.  #2 cholelithiasis #3 chronic kidney disease #4.  Hypertension #5.  Diverticulosis  Plan; Patient is scheduled for EGD with Dr. Tarri Glenn tomorrow 02/07/2018.  Dr. Carlean Purl had no availability through the end of December.  I did discuss CT findings with the patient and his wife and relayed concerns about probable malignancy.  He is encouraged to continue  very soft low residue diet with frequent small feedings Have refilled Protonix 40 mg p.o. twice daily. We will refill Carafate liquid 1 g 4 times daily between meals and at bedtime x1 He was given another prescription for hydrocodone 5/325 1 p.o. every 6 hours as needed for pain #60 and no refills.  Keslyn Teater S Fae Blossom PA-C 02/06/2018   Cc: Christain Sacramento, MD

## 2018-02-06 NOTE — Patient Instructions (Addendum)
If you are age 64 or older, your body mass index should be between 23-30. Your Body mass index is 31.1 kg/m. If this is out of the aforementioned range listed, please consider follow up with your Primary Care Provider.  If you are age 27 or younger, your body mass index should be between 19-25. Your Body mass index is 31.1 kg/m. If this is out of the aformentioned range listed, please consider follow up with your Primary Care Provider.   You have been scheduled for an endoscopy. Please follow written instructions given to you at your visit today. If you use inhalers (even only as needed), please bring them with you on the day of your procedure. Your physician has requested that you go to www.startemmi.com and enter the access code given to you at your visit today. This web site gives a general overview about your procedure. However, you should still follow specific instructions given to you by our office regarding your preparation for the procedure.  We have sent the following medications to your pharmacy for you to pick up at your convenience: Protonix Carafate Oxycodone  Soft, low roughage diet. Frequent small amounts.  Thank you for choosing me and [facility].   Amy Esterwood, PA-C

## 2018-02-06 NOTE — Progress Notes (Signed)
Reviewed. I agree with documentation including the assessment and plan.  Lavonne Cass L. Ruperto Kiernan, MD, MPH 

## 2018-02-07 ENCOUNTER — Ambulatory Visit (AMBULATORY_SURGERY_CENTER): Payer: Medicare Other | Admitting: Gastroenterology

## 2018-02-07 ENCOUNTER — Encounter: Payer: Self-pay | Admitting: Gastroenterology

## 2018-02-07 VITALS — BP 102/57 | HR 72 | Temp 99.3°F | Resp 14 | Ht 71.0 in | Wt 223.0 lb

## 2018-02-07 DIAGNOSIS — K317 Polyp of stomach and duodenum: Secondary | ICD-10-CM

## 2018-02-07 DIAGNOSIS — K2970 Gastritis, unspecified, without bleeding: Secondary | ICD-10-CM | POA: Diagnosis not present

## 2018-02-07 DIAGNOSIS — C162 Malignant neoplasm of body of stomach: Secondary | ICD-10-CM | POA: Diagnosis present

## 2018-02-07 DIAGNOSIS — R1013 Epigastric pain: Secondary | ICD-10-CM

## 2018-02-07 MED ORDER — SODIUM CHLORIDE 0.9 % IV SOLN: 500.0000 mL | Freq: Once | INTRAVENOUS | Status: AC

## 2018-02-07 NOTE — Progress Notes (Signed)
Called to room to assist during endoscopic procedure.  Patient ID and intended procedure confirmed with present staff. Received instructions for my participation in the procedure from the performing physician.  

## 2018-02-07 NOTE — Progress Notes (Signed)
To PACU, VSS. Report to [email or web address] 

## 2018-02-07 NOTE — Op Note (Addendum)
[place] Patient Name: Steven Ortega Procedure Date: 02/07/2018 9:02 AM MRN: [id number] Endoscopist: Thornton Park MD, MD Age: 62 Referring MD:  Date of Birth: [birth year] Gender: Male Account #: [id number] Procedure:                Upper GI endoscopy Indications:              Abnormal CT of the GI tract Medicines:                See the Anesthesia note for documentation of the                            administered medications Procedure:                Pre-Anesthesia Assessment:                           - Prior to the procedure, a History and Physical                            was performed, and patient medications and                            allergies were reviewed. The patient's tolerance of                            previous anesthesia was also reviewed. The risks                            and benefits of the procedure and the sedation                            options and risks were discussed with the patient.                            All questions were answered, and informed consent                            was obtained. Prior Anticoagulants: The patient has                            taken no previous anticoagulant or antiplatelet                            agents. ASA Grade Assessment: II - A patient with                            mild systemic disease. After reviewing the risks                            and benefits, the patient was deemed in                            satisfactory condition to undergo the procedure.  After obtaining informed consent, the endoscope was                            passed under direct vision. Throughout the                            procedure, the patient's blood pressure, pulse, and                            oxygen saturations were monitored continuously. The                            Model GIF-[id number]) scope was introduced                            through the mouth, and  advanced to the second part                            of duodenum. The upper GI endoscopy was                            accomplished without difficulty. The patient                            tolerated the procedure well. Scope In: Scope Out: Findings:                 The examined esophagus was normal.                           A large, fungating, circumferential mass with no                            bleeding and stigmata of recent bleeding was found                            in the gastric body along the lesser curve. I was                            easily able to traverse the mass. Biopsies were                            taken with a cold forceps for histology. There was                            oozing with the biopsies. The antrum and pylorus                            appear preserved. A large amount of retained food                            remained in the stomach. Random biopsies were  obtained with cold forceps from the antrum and                            fundus for H pylori.                           A single 3 mm sessile polyp with no bleeding was                            found in the second portion of the duodenum.                            Biopsies were taken with a cold forceps for                            histology. Complications:            No immediate complications. Estimated Blood Loss:     Estimated blood loss: none. Impression:               - Normal esophagus.                           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 Biopsied.                           - A single duodenal polyp. Biopsied. Recommendation:           - Resume regular diet.                           - Continue present medications.                           - Await pathology results. I will call you when                            these results are available. Thornton Park MD, MD 02/07/2018 9:33:52 AM This  report has been signed electronically.

## 2018-02-07 NOTE — Patient Instructions (Signed)
YOU HAD AN ENDOSCOPIC PROCEDURE TODAY AT [facility]:   Refer to the procedure report that was given to you for any specific questions about what was found during the examination.  If the procedure report does not answer your questions, please call your gastroenterologist to clarify.  If you requested that your care partner not be given the details of your procedure findings, then the procedure report has been included in a sealed envelope for you to review at your convenience later.  YOU SHOULD EXPECT: Some feelings of bloating in the abdomen. Passage of more gas than usual.  Walking can help get rid of the air that was put into your GI tract during the procedure and reduce the bloating. If you had a lower endoscopy (such as a colonoscopy or flexible sigmoidoscopy) you may notice spotting of blood in your stool or on the toilet paper. If you underwent a bowel prep for your procedure, you may not have a normal bowel movement for a few days.  Please Note:  You might notice some irritation and congestion in your nose or some drainage.  This is from the oxygen used during your procedure.  There is no need for concern and it should clear up in a day or so.  SYMPTOMS TO REPORT IMMEDIATELY:   Following upper endoscopy (EGD)  Vomiting of blood or coffee ground material  New chest pain or pain under the shoulder blades  Painful or persistently difficult swallowing  New shortness of breath  Fever of 100F or higher  Black, tarry-looking stools  For urgent or emergent issues, a gastroenterologist can be reached at any hour by calling [phone].   DIET:  We do recommend a small meal at first, but then you may proceed to your regular diet.  Drink plenty of fluids but you should avoid alcoholic beverages for 24 hours.  MEDICATIONS: Continue present medications.  Please see handouts given to you by your recovery nurse.  Dr. Tarri Glenn will call you with the pathology  results.  ACTIVITY:  You should plan to take it easy for the rest of today and you should NOT DRIVE or use heavy machinery until tomorrow (because of the sedation medicines used during the test).    FOLLOW UP: Our staff will call the number listed on your records the next business day following your procedure to check on you and address any questions or concerns that you may have regarding the information given to you following your procedure. If we do not reach you, we will leave a message.  However, if you are feeling well and you are not experiencing any problems, there is no need to return our call.  We will assume that you have returned to your regular daily activities without incident.  If any biopsies were taken you will be contacted by phone or by letter within the next 1-3 weeks.  Please call us at [phone] if you have not heard about the biopsies in 3 weeks.   Thank you for allowing [place] to provide for your healthcare needs today.  SIGNATURES/CONFIDENTIALITY: You and/or your care partner have signed paperwork which will be entered into your electronic medical record.  These signatures attest to the fact that that the information above on your After Visit Summary has been reviewed and is understood.  Full responsibility of the confidentiality of this discharge information lies with you and/or your care-partner.

## 2018-02-07 NOTE — Progress Notes (Signed)
Pt's states no medical or surgical changes since previsit or office visit. 

## 2018-02-10 ENCOUNTER — Ambulatory Visit (INDEPENDENT_AMBULATORY_CARE_PROVIDER_SITE_OTHER): Payer: Medicare Other | Admitting: Physician Assistant

## 2018-02-10 ENCOUNTER — Other Ambulatory Visit: Payer: Self-pay

## 2018-02-10 ENCOUNTER — Telehealth: Payer: Self-pay | Admitting: Gastroenterology

## 2018-02-10 ENCOUNTER — Telehealth: Payer: Self-pay | Admitting: *Deleted

## 2018-02-10 ENCOUNTER — Telehealth: Payer: Self-pay

## 2018-02-10 ENCOUNTER — Encounter: Payer: Self-pay | Admitting: Physician Assistant

## 2018-02-10 VITALS — BP 112/80 | HR 104 | Temp 98.9°F | Ht 71.0 in | Wt 213.0 lb

## 2018-02-10 DIAGNOSIS — K311 Adult hypertrophic pyloric stenosis: Secondary | ICD-10-CM

## 2018-02-10 DIAGNOSIS — R1013 Epigastric pain: Secondary | ICD-10-CM

## 2018-02-10 DIAGNOSIS — C169 Malignant neoplasm of stomach, unspecified: Secondary | ICD-10-CM

## 2018-02-10 MED ORDER — OXYCODONE-ACETAMINOPHEN 7.5-325 MG PO TABS: ORAL_TABLET | ORAL | 0 refills | Status: DC

## 2018-02-10 NOTE — Telephone Encounter (Signed)
Pt return nurse f/u call he did state that he is still having issues and that his stomach is still swelling up on the right side and is in a lot of pain.

## 2018-02-10 NOTE — Progress Notes (Signed)
Subjective:    Patient ID: Steven Ortega, male    DOB: 1955/07/10, 62 y.o.   MRN: [id number]  HPI Steven Ortega is a very nice 62 year old white male, established with Dr. Carlean Purl who was seen by myself last week on 02/06/2018 with complaints of upper abdominal pain.  He had been to the emergency room on 02/03/2018.  Current symptoms had started 1 to 2 months ago and had progressed to becoming constant.  He has had significant increase in pain over the past week and associated nausea decrease in appetite and inability to eat much at one setting.  Also had a lot of burping and gas. CT scan done on 02/03/2018 showed trace free fluid in the pelvis and diffuse mesenteric stranding, there is a single gallstone in the neck of the gallbladder no gallbladder wall thickening or ductal dilation.  There was question of an area of adenomyomatosis with an 18 x 8 mm density along the inferior gallbladder wall and significant thickening of the distal stomach at the antrum and pylorus was noted concerning for an infiltrative process.  There was narrowing of the lumen in the distal stomach and a 10 mm node adjacent to the stomach. He has been requiring oral narcotics.  He was given a prescription for Percocet 5/325 last week when he was seen in the office in addition to continuing Protonix and Carafate.  He was scheduled for upper endoscopy with Dr. Tarri Glenn on 02/07/2018 to expedite his work-up. He was found to have a large fungating circumferential mass of the gastric body along the lesser curve which was oozing.  There was a lot of retained fluid in the stomach.  This area was able to be traversed and biopsies were taken. Biopsies were pending at the time of office visit today but have since returned showing an adenocarcinoma. Patient had called this morning due to increased complaints of pain, particularly at night.  He was also concerned because he has noticed a fullness or bulging in his epigastrium sometimes after eating.   He has continued to try to eat some solid food.  Review of Systems Pertinent positive and negative review of systems were noted in the above HPI section.  All other review of systems was otherwise negative.  Outpatient Encounter Medications as of 02/10/2018  Medication Sig  . acyclovir (ZOVIRAX) 800 MG tablet Take 400 mg by mouth daily as needed (fever blisters).   . ALPRAZolam (XANAX) 1 MG tablet Take 1 mg by mouth at bedtime.   Marland Kitchen buPROPion (ZYBAN) 150 MG 12 hr tablet Take 150 mg by mouth 2 (two) times daily.  . Cholecalciferol 2000 UNITS CAPS Take 2,000 Units by mouth daily.   . citalopram (CELEXA) 40 MG tablet Take 40 mg by mouth daily.   . cyanocobalamin 500 MCG tablet Take 500 mcg by mouth 4 (four) times daily.   . cyclobenzaprine (FLEXERIL) 10 MG tablet Take 10 mg by mouth at bedtime.  . docusate sodium (STOOL SOFTENER) 100 MG capsule Take 100 mg by mouth daily.   . ferrous sulfate 325 (65 FE) MG tablet Take 325 mg by mouth daily with breakfast.  . gemfibrozil (LOPID) 600 MG tablet Take 600 mg by mouth daily.   . hydrochlorothiazide (HYDRODIURIL) 25 MG tablet Take 25 mg by mouth daily.  Marland Kitchen levothyroxine (SYNTHROID, LEVOTHROID) 25 MCG tablet Take 25 mcg by mouth daily before breakfast.  . Omega-3 Fatty Acids (FISH OIL) 1000 MG CAPS Take 1,000 mg by mouth 2 (two) times daily.   Marland Kitchen  oxyCODONE-acetaminophen (PERCOCET) 5-325 MG tablet Take 1 tablet by mouth every 4 (four) hours as needed (May take one every 4-6 hours as needed).  . pantoprazole (PROTONIX) 40 MG tablet Take 1 tablet (40 mg total) by mouth 2 (two) times daily before a meal.  . polyethylene glycol (MIRALAX / GLYCOLAX) packet Take 17 g by mouth daily.  . sodium bicarbonate 325 MG tablet Take 325 mg by mouth 2 (two) times daily.  . sucralfate (CARAFATE) 1 GM/10ML suspension Take 10 mLs (1 g total) by mouth 4 (four) times daily -  with meals and at bedtime.  . tamsulosin (FLOMAX) 0.4 MG CAPS capsule Take 1 capsule (0.4 mg total) by  mouth daily.  Marland Kitchen oxyCODONE-acetaminophen (PERCOCET) 7.5-325 MG tablet Take 1 tablet by mouth 1 every 4-6 hours as needed.   Facility-Administered Encounter Medications as of 02/10/2018  Medication  . 0.9 %  sodium chloride infusion   No Known Allergies Patient Active Problem List   Diagnosis Date Noted  . Diverticulitis of colon (without mention of hemorrhage)(562.11) 10/13/2013  . Abdominal pain, left lower quadrant 10/13/2013  . ANEMIA DUE TO CHRONIC BLOOD LOSS 07/30/2008  . GASTRITIS, CHRONIC 06/29/2008  . COLONIC POLYPS 03/11/2008  . HYPERLIPIDEMIA 03/11/2008  . UNSPECIFIED ANEMIA 03/11/2008  . ANXIETY 03/11/2008  . DEPRESSION 03/11/2008  . HYPERTENSION 03/11/2008  . DIVERTICULOSIS, COLON 03/11/2008  . ANAL FISSURE 03/11/2008  . CHRONIC KIDNEY DISEASE UNSPECIFIED 03/11/2008  . NEPHROLITHIASIS 03/11/2008  . CHEST PAIN UNSPECIFIED 03/11/2008  . LUQ PAIN 03/11/2008  . ARTHRITIS, HX OF 03/11/2008   Social History   Socioeconomic History  . Marital status: Married    Spouse name: Not on file  . Number of children: 2  . Years of education: Not on file  . Highest education level: Not on file  Occupational History  . Occupation: retired  Scientific laboratory technician  . Financial resource strain: Not on file  . Food insecurity:    Worry: Not on file    Inability: Not on file  . Transportation needs:    Medical: Not on file    Non-medical: Not on file  Tobacco Use  . Smoking status: Former Smoker    Types: Cigarettes    Last attempt to quit: 03/05/1998    Years since quitting: 19.9  . Smokeless tobacco: Never Used  Substance and Sexual Activity  . Alcohol use: Yes    Alcohol/week: 1.0 standard drinks    Types: 1 Cans of beer per week    Comment: occasional  . Drug use: No  . Sexual activity: Not on file  Lifestyle  . Physical activity:    Days per week: Not on file    Minutes per session: Not on file  . Stress: Not on file  Relationships  . Social connections:    Talks on  phone: Not on file    Gets together: Not on file    Attends religious service: Not on file    Active member of club or organization: Not on file    Attends meetings of clubs or organizations: Not on file    Relationship status: Not on file  . Intimate partner violence:    Fear of current or ex partner: Not on file    Emotionally abused: Not on file    Physically abused: Not on file    Forced sexual activity: Not on file  Other Topics Concern  . Not on file  Social History Narrative  . Not on file  Mr. Steven Ortega family history includes Heart attack in his brother and father; Lung cancer in his father.      Objective:    Vitals:   02/10/18 1128  BP: 112/80  Pulse: (!) 104  Temp: 98.9 F (37.2 C)    Physical Exam; well-developed older white male in no acute distress, accompanied by his wife blood pressure 112/80 pulse 104 temp 98 9.  HEENT; nontraumatic normocephalic EOMI PERRLA sclera anicteric oral mucosa moist, Cardiovascular; regular rate and rhythm with S1-S2 no murmur rub or gallop, Pulmonary; clear bilaterally, Abdomen; soft, bowel sounds are present he is again quite tender across the upper abdomen particularly in the epigastrium, there is fullness but no definite palpable mass, Rectal ;exam not done, Extremities ;no clubbing cyanosis or edema skin warm and dry, Neuropsych; alert and oriented, grossly nonfocal mood and affect appropriate       Assessment & Plan:   #78 62 year old white male with new diagnosis of gastric adenocarcinoma with a large fungating circumferential mass in the gastric body lesser curvature with partial outlet obstruction and significant retained food in the stomach at the time of EGD on 02/07/2018.  On CT scan he did have a 10 mm lymph node noted adjacent to the distal stomach and has diffuse mesenteric stranding of the upper abdomen.  This raises suspicion for at least intra-abdominal metastatic disease.  #2 significant upper abdominal pain-now  requiring narcotics and pain not well controlled with Percocet 5/325.  Patient's pain is also increased postprandially.  Plan; Discussed with Dr. Carlean Purl Path report per limb has returned showing adenocarcinoma. I had a frank discussion with the patient and his wife today are and they are aware that this is a malignant process and will require further work-up, and consultation to determine best treatment options.  I have asked him to decrease diet to a full liquid diet which we reviewed today.  He will also start Ensure or boost high-protein supplement 2-3 times daily.  Continue Protonix 40 mg daily. He will finish his current prescription for Carafate liquid 1 g 4 times daily. He was given a new prescription for Percocet today 7.5/325 mg 1 p.o. every 4-6 hours PRN for pain #50 and no refills. He may require a longer acting narcotic if this is ineffective.  Patient will be referred to oncology for consultation/Dr. Benay Spice / Dr. Burr Medico as soon as possible, and will also be referred to oncologic surgery/Dr. Barry Dienes.     Margaretann Abate S Aleka Twitty PA-C 02/10/2018   Cc: Christain Sacramento, MD

## 2018-02-10 NOTE — Patient Instructions (Signed)
We have given you a new prescription to take to your pharmacy for Percocet 7.5/325 mg for pain. Continue Carafate 4 times daily.  Take Pantoprazole sodium ( Protonix) 40 mg. Take 1 tablet by mouth twice daily.  Full liquid diet.  Add Ensure or Boost High protein- 2-3 times daily.   Normal BMI (Body Mass Index- based on height and weight) is between 19 and 25. Your BMI today is Body mass index is 29.71 kg/m. Marland Kitchen Please consider follow up  regarding your BMI with your Primary Care Provider.

## 2018-02-10 NOTE — Telephone Encounter (Signed)
Left message on f/u call 

## 2018-02-10 NOTE — Telephone Encounter (Signed)
Returned patients call and spoke with him and his wife. The patient is experiencing extreme pain in the right upper abdominal region. He states " a bulge about the size of my fist appeared yesterday." He messaged it and walked trying to "calm" it down but it returned. He is eating but in very small quantities and does not know if this has any effect on the bulge or not. I contacted Dr. Tarri Glenn who did the procedure and she advised me to call Amy, PA to see if she could see him today. After speaking to Amy, she is going to work the patient in late morning today. I will let Dr. Tarri Glenn know.   Patient aware of appointment and will be here at 11:30.

## 2018-02-10 NOTE — Telephone Encounter (Signed)
  Follow up Call-  Call back number 02/07/2018  Post procedure Call Back phone  # [phone]    [phone]  Permission to leave phone message Yes  Some recent data might be hidden     Patient questions:  Do you have a fever, pain , or abdominal swelling? Yes.   Pain Score  7 *  Have you tolerated food without any problems? Yes, eating but having pain.  Have you been able to return to your normal activities? No.  Do you have any questions about your discharge instructions: Diet   No. Medications  No. Follow up visit  No.  Do you have questions or concerns about your Care? Yes.    Actions: * If pain score is 4 or above: Physician/ provider Notified : Joelene Millin L. Tarri Glenn, MD. Nicoletta Ba, PA notified as well. **See other telephone note.**

## 2018-02-13 ENCOUNTER — Telehealth: Payer: Self-pay | Admitting: Gastroenterology

## 2018-02-13 NOTE — Telephone Encounter (Signed)
[facility] called and states that patient wants to see oncologist at our [facility] location.

## 2018-02-13 NOTE — Telephone Encounter (Signed)
Wasn't sure if this had been communicated to you. Very nice patient. Good family support.

## 2018-02-14 ENCOUNTER — Telehealth: Payer: Self-pay

## 2018-02-14 ENCOUNTER — Other Ambulatory Visit: Payer: Self-pay | Admitting: *Deleted

## 2018-02-14 ENCOUNTER — Encounter: Payer: Self-pay | Admitting: *Deleted

## 2018-02-14 DIAGNOSIS — C169 Malignant neoplasm of stomach, unspecified: Secondary | ICD-10-CM

## 2018-02-14 DIAGNOSIS — E639 Nutritional deficiency, unspecified: Secondary | ICD-10-CM

## 2018-02-14 DIAGNOSIS — R1032 Left lower quadrant pain: Secondary | ICD-10-CM

## 2018-02-14 DIAGNOSIS — C269 Malignant neoplasm of ill-defined sites within the digestive system: Secondary | ICD-10-CM

## 2018-02-14 DIAGNOSIS — N189 Chronic kidney disease, unspecified: Secondary | ICD-10-CM

## 2018-02-14 DIAGNOSIS — K5732 Diverticulitis of large intestine without perforation or abscess without bleeding: Secondary | ICD-10-CM

## 2018-02-14 DIAGNOSIS — D126 Benign neoplasm of colon, unspecified: Secondary | ICD-10-CM

## 2018-02-14 NOTE — Telephone Encounter (Signed)
  Oncology Nurse Navigator Documentation   Called and spoke with Steven Ortega to introduce myself and to provided my contact information. Advised that I will call back with T/D/L of initial consult.

## 2018-02-14 NOTE — Progress Notes (Signed)
Reached out to Steven Ortega to introduce myself as the office RN Navigator and explain our new patient process. Reviewed the reason for their referral and scheduled their new patient appointment along with labs. Provided address and directions to the office including call back phone number. Reviewed with patient any concerns they may have or any possible barriers to attending their appointment.   Informed patient about my role as a navigator and that I will meet with them prior to their New Patient appointment and more fully discuss what services I can provide. At this time patient has no further questions or needs.

## 2018-02-14 NOTE — Progress Notes (Signed)
  Oncology Nurse Navigator Documentation   Touched base with Inocencio Homes, RN Navigator at [facility]. Roselyn Reef will reach out to patient schedule appointment at [facility], if patient agrees.

## 2018-02-21 ENCOUNTER — Encounter: Payer: Self-pay | Admitting: *Deleted

## 2018-02-21 ENCOUNTER — Encounter: Payer: Self-pay | Admitting: Hematology & Oncology

## 2018-02-21 ENCOUNTER — Inpatient Hospital Stay (HOSPITAL_BASED_OUTPATIENT_CLINIC_OR_DEPARTMENT_OTHER): Payer: Medicare Other | Admitting: Hematology & Oncology

## 2018-02-21 ENCOUNTER — Inpatient Hospital Stay: Payer: Medicare Other | Attending: Hematology & Oncology

## 2018-02-21 VITALS — BP 110/65 | HR 99 | Temp 98.2°F | Resp 18 | Ht 72.0 in | Wt 209.0 lb

## 2018-02-21 DIAGNOSIS — R12 Heartburn: Secondary | ICD-10-CM | POA: Diagnosis not present

## 2018-02-21 DIAGNOSIS — Z7189 Other specified counseling: Secondary | ICD-10-CM

## 2018-02-21 DIAGNOSIS — Z87891 Personal history of nicotine dependence: Secondary | ICD-10-CM

## 2018-02-21 DIAGNOSIS — E611 Iron deficiency: Secondary | ICD-10-CM | POA: Insufficient documentation

## 2018-02-21 DIAGNOSIS — R634 Abnormal weight loss: Secondary | ICD-10-CM

## 2018-02-21 DIAGNOSIS — C165 Malignant neoplasm of lesser curvature of stomach, unspecified: Secondary | ICD-10-CM | POA: Insufficient documentation

## 2018-02-21 DIAGNOSIS — N189 Chronic kidney disease, unspecified: Secondary | ICD-10-CM

## 2018-02-21 DIAGNOSIS — C162 Malignant neoplasm of body of stomach: Secondary | ICD-10-CM | POA: Diagnosis present

## 2018-02-21 DIAGNOSIS — C269 Malignant neoplasm of ill-defined sites within the digestive system: Secondary | ICD-10-CM

## 2018-02-21 DIAGNOSIS — R110 Nausea: Secondary | ICD-10-CM | POA: Diagnosis not present

## 2018-02-21 DIAGNOSIS — Z79899 Other long term (current) drug therapy: Secondary | ICD-10-CM | POA: Insufficient documentation

## 2018-02-21 DIAGNOSIS — Z85028 Personal history of other malignant neoplasm of stomach: Secondary | ICD-10-CM

## 2018-02-21 DIAGNOSIS — D126 Benign neoplasm of colon, unspecified: Secondary | ICD-10-CM

## 2018-02-21 DIAGNOSIS — R109 Unspecified abdominal pain: Secondary | ICD-10-CM

## 2018-02-21 DIAGNOSIS — Z801 Family history of malignant neoplasm of trachea, bronchus and lung: Secondary | ICD-10-CM

## 2018-02-21 DIAGNOSIS — K5732 Diverticulitis of large intestine without perforation or abscess without bleeding: Secondary | ICD-10-CM

## 2018-02-21 DIAGNOSIS — R1032 Left lower quadrant pain: Secondary | ICD-10-CM

## 2018-02-21 DIAGNOSIS — E639 Nutritional deficiency, unspecified: Secondary | ICD-10-CM

## 2018-02-21 DIAGNOSIS — C169 Malignant neoplasm of stomach, unspecified: Secondary | ICD-10-CM

## 2018-02-21 HISTORY — DX: Other specified counseling: Z71.89

## 2018-02-21 HISTORY — DX: Malignant neoplasm of lesser curvature of stomach, unspecified: C16.5

## 2018-02-21 LAB — CBC WITH DIFFERENTIAL (CANCER CENTER ONLY)
Abs Immature Granulocytes: 0.03 10*3/uL (ref 0.00–0.07)
Basophils Absolute: 0.1 10*3/uL (ref 0.0–0.1)
Basophils Relative: 1 %
EOS ABS: 0.2 10*3/uL (ref 0.0–0.5)
Eosinophils Relative: 3 %
HCT: 40.6 % (ref 39.0–52.0)
Hemoglobin: 12.9 g/dL — ABNORMAL LOW (ref 13.0–17.0)
Immature Granulocytes: 0 %
LYMPHS ABS: 1 10*3/uL (ref 0.7–4.0)
Lymphocytes Relative: 14 %
MCH: 27.6 pg (ref 26.0–34.0)
MCHC: 31.8 g/dL (ref 30.0–36.0)
MCV: 86.9 fL (ref 80.0–100.0)
Monocytes Absolute: 0.6 10*3/uL (ref 0.1–1.0)
Monocytes Relative: 8 %
Neutro Abs: 5.3 10*3/uL (ref 1.7–7.7)
Neutrophils Relative %: 74 %
Platelet Count: 476 10*3/uL — ABNORMAL HIGH (ref 150–400)
RBC: 4.67 MIL/uL (ref 4.22–5.81)
RDW: 12.4 % (ref 11.5–15.5)
WBC Count: 7.1 10*3/uL (ref 4.0–10.5)
nRBC: 0 % (ref 0.0–0.2)

## 2018-02-21 LAB — CMP (CANCER CENTER ONLY)
ALT: 10 U/L (ref 0–44)
AST: 13 U/L — ABNORMAL LOW (ref 15–41)
Albumin: 4.6 g/dL (ref 3.5–5.0)
Alkaline Phosphatase: 65 U/L (ref 38–126)
Anion gap: 13 (ref 5–15)
BUN: 21 mg/dL (ref 8–23)
CO2: 30 mmol/L (ref 22–32)
Calcium: 9.9 mg/dL (ref 8.9–10.3)
Chloride: 93 mmol/L — ABNORMAL LOW (ref 98–111)
Creatinine: 1.34 mg/dL — ABNORMAL HIGH (ref 0.61–1.24)
GFR, Est AFR Am: >60 mL/min (ref >60)
GFR, Estimated: 56 mL/min — ABNORMAL LOW (ref >60)
Glucose, Bld: 127 mg/dL — ABNORMAL HIGH (ref 70–99)
Potassium: 3.1 mmol/L — ABNORMAL LOW (ref 3.5–5.1)
SODIUM: 136 mmol/L (ref 135–145)
Total Bilirubin: 0.4 mg/dL (ref 0.3–1.2)
Total Protein: 7.5 g/dL (ref 6.5–8.1)

## 2018-02-21 LAB — PREALBUMIN: Prealbumin: 21.2 mg/dL (ref 18–38)

## 2018-02-21 LAB — LACTATE DEHYDROGENASE: LDH: 128 U/L (ref 98–192)

## 2018-02-21 NOTE — Progress Notes (Signed)
..  Initial RN Navigator Patient Visit  Name: Steven Ortega Date of Referral : 02/14/2018 Diagnosis: Stomach Cancer  Met with patient prior to their visit with MD. Hanley Seamen patient "Your Patient Navigator" handout which explains my role, areas in which I am able to help, and all the contact information for myself and the office. Also gave patient MD and Navigator business card. Reviewed with patient the general overview of expected course after initial diagnosis and time frame for all steps to be completed.  Patient completed visit with Dr. Marin Olp  Patient is seeing patient late on Friday afternoon. Informed patient that I will meet with Dr Marin Olp on Monday regarding needs and call them with the plan. Patient and wife agree to this.   Will follow up with MD and patient on Monday.   Patient understands all follow up procedures and expectations. They have my number to reach out for any further clarification or additional needs. Will call patient in 5-7 days to see if any further needs have presented, or if patient has any further questions or needs.

## 2018-02-21 NOTE — Progress Notes (Signed)
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 New Patient (Initial Visit)  : I have been having abdominal pain for a couple months.  HPI: Mr. Brosch is a very nice 62 year old white male.  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  He is originally from [place].  He has been in good health.  He has had past surgeries but these have been elective surgeries.  He has been having some abdominal discomfort.  He says that when he has been sleeping at nighttime, he has a hard time sleeping on his right side because of discomfort.  He has had a lot of gas.  There is been no melena or bright red blood per rectum.  He has had no hemoptysis or hematemesis.  He had a CT scan of the abdomen and pelvis on 02/03/2018.  This showed thickening of the distal stomach and pylorus.  It was felt that this was concerning for a neoplasm.  There was no obvious adenopathy.  The liver looked okay.  He subsequently had a upper endoscopy.  This was done on 02/07/2018.  Biopsies were taken.  The pathology report ([id number]) showed a poorly differentiated adenocarcinoma with signet ring features in the gastric body.  Stains were negative for Helicobacter.  No intestinal metaplasia was noted.  He subsequently was referred to the [facility] for an evaluation.  He has lost about 15 pounds over the past couple weeks.  He says when he eats, he has a lot of pain.  He is on antiacids.  He is on Carafate.  He does take a PPI.  There is been no cough.  He has had no leg swelling.  He has had no headache.  Overall, his performance status is ECOG 1.   Past Medical History:  Diagnosis Date  . Adenomatous colon polyp   . Anal fissure   . Anemia   . Anxiety   . Arthritis   . Cancer of lesser curvature of stomach ([place]) 02/21/2018  . Constipation   .  Depression   . Diverticulosis   . GERD (gastroesophageal reflux disease)   . Goals of care, counseling/discussion 02/21/2018  . Hearing aid worn    b/l  . HOH (hard of hearing)   . Hyperlipidemia   . Hypertension   . Hypothyroidism   . Kidney stones   . Kidney stones   . Pyloric erosion   . Wears dentures   . Wears glasses   :  Past Surgical History:  Procedure Laterality Date  . ANAL FISSURE REPAIR  2010  . APPENDECTOMY  1984  . COLONOSCOPY W/ BIOPSIES AND POLYPECTOMY    . HERNIA REPAIR    . LUMBAR FUSION  2013  . SHOULDER ARTHROSCOPY WITH SUBACROMIAL DECOMPRESSION, ROTATOR CUFF REPAIR AND BICEP TENDON REPAIR Left 12/09/2017   Procedure: LEFT SHOULDER ARTHROSCOPY SUBACROMIAL DECOMPRESSION, ROTATOR CUFF TEAR REPAIR;  Surgeon: Meredith Pel, MD;  Location: [facility];  Service: Orthopedics;  Laterality: Left;  . THUMB FUSION Left 2008  . THUMB FUSION Right 2008   x 2  . TRIGGER FINGER RELEASE Left 2008   2nd finger  :   Current Outpatient Medications:  .  acyclovir (ZOVIRAX) 800 MG tablet, Take 400 mg by mouth daily as needed (fever blisters). , Disp: , Rfl:  .  ALPRAZolam (XANAX) 1 MG tablet, Take  1 mg by mouth at bedtime. , Disp: , Rfl:  .  buPROPion (ZYBAN) 150 MG 12 hr tablet, Take 150 mg by mouth 2 (two) times daily., Disp: , Rfl:  .  Cholecalciferol 2000 UNITS CAPS, Take 2,000 Units by mouth daily. , Disp: , Rfl:  .  citalopram (CELEXA) 40 MG tablet, Take 40 mg by mouth daily. , Disp: , Rfl:  .  cyanocobalamin 500 MCG tablet, Take 500 mcg by mouth 4 (four) times daily. , Disp: , Rfl:  .  cyclobenzaprine (FLEXERIL) 10 MG tablet, Take 10 mg by mouth at bedtime., Disp: , Rfl:  .  docusate sodium (STOOL SOFTENER) 100 MG capsule, Take 100 mg by mouth daily. , Disp: , Rfl:  .  ferrous sulfate 325 (65 FE) MG tablet, Take 325 mg by mouth daily with breakfast., Disp: , Rfl:  .  gemfibrozil (LOPID) 600 MG tablet, Take 600 mg by mouth daily. , Disp: , Rfl:  .   hydrochlorothiazide (HYDRODIURIL) 25 MG tablet, Take 25 mg by mouth daily., Disp: , Rfl:  .  levothyroxine (SYNTHROID, LEVOTHROID) 25 MCG tablet, Take 25 mcg by mouth daily before breakfast., Disp: , Rfl:  .  Omega-3 Fatty Acids (FISH OIL) 1000 MG CAPS, Take 1,000 mg by mouth 2 (two) times daily. , Disp: , Rfl:  .  oxyCODONE-acetaminophen (PERCOCET) 7.5-325 MG tablet, Take 1 tablet by mouth 1 every 4-6 hours as needed., Disp: 50 tablet, Rfl: 0 .  pantoprazole (PROTONIX) 40 MG tablet, Take 1 tablet (40 mg total) by mouth 2 (two) times daily before a meal., Disp: 60 tablet, Rfl: 3 .  polyethylene glycol (MIRALAX / GLYCOLAX) packet, Take 17 g by mouth daily., Disp: , Rfl:  .  sodium bicarbonate 325 MG tablet, Take 325 mg by mouth 2 (two) times daily., Disp: , Rfl:  .  sucralfate (CARAFATE) 1 GM/10ML suspension, Take 10 mLs (1 g total) by mouth 4 (four) times daily -  with meals and at bedtime., Disp: 420 mL, Rfl: 0 .  tamsulosin (FLOMAX) 0.4 MG CAPS capsule, Take 1 capsule (0.4 mg total) by mouth daily., Disp: 30 capsule, Rfl: 0  Current Facility-Administered Medications:  .  0.9 %  sodium chloride infusion, 500 mL, Intravenous, Once, [place], MD:  :  No Known Allergies:  Family History  Problem Relation Age of Onset  . Heart attack Brother        x 2  . Heart attack Father   . Lung cancer Father   . Colon cancer Neg Hx   :  Social History   Socioeconomic History  . Marital status: Married    Spouse name: Not on file  . Number of children: 2  . Years of education: Not on file  . Highest education level: Not on file  Occupational History  . Occupation: retired  Scientific laboratory technician  . Financial resource strain: Not on file  . Food insecurity:    Worry: Not on file    Inability: Not on file  . Transportation needs:    Medical: Not on file    Non-medical: Not on file  Tobacco Use  . Smoking status: Former Smoker    Types: Cigarettes, Pipe    Last attempt to quit: 03/05/1998     Years since quitting: 19.9  . Smokeless tobacco: Former Systems developer    Types: [place] date: 03/05/1993  Substance and Sexual Activity  . Alcohol use: Not Currently  . Drug use: No  .  Sexual activity: Not on file  Lifestyle  . Physical activity:    Days per week: Not on file    Minutes per session: Not on file  . Stress: Not on file  Relationships  . Social connections:    Talks on phone: Not on file    Gets together: Not on file    Attends religious service: Not on file    Active member of club or organization: Not on file    Attends meetings of clubs or organizations: Not on file    Relationship status: Not on file  . Intimate partner violence:    Fear of current or ex partner: Not on file    Emotionally abused: Not on file    Physically abused: Not on file    Forced sexual activity: Not on file  Other Topics Concern  . Not on file  Social History Narrative  . Not on file  :  Review of Systems  Constitutional: Positive for weight loss.  HENT: Negative.   Eyes: Negative.   Respiratory: Negative.   Cardiovascular: Negative.   Gastrointestinal: Positive for abdominal pain, heartburn and nausea.  Genitourinary: Negative.   Musculoskeletal: Negative.   Skin: Negative.   Neurological: Negative.   Endo/Heme/Allergies: Negative.   Psychiatric/Behavioral: Negative.      Exam: This is a well-developed well-nourished white male in no obvious distress.  Vital signs show a temperature of 98.2.  Pulse 99.  Blood pressure 110/65.  Weight is 209 pounds.  Head neck exam shows no ocular or oral lesions.  There are no palpable cervical or supraclavicular lymph nodes.  Lungs are clear bilaterally.  Cardiac exam regular rate and rhythm with no murmurs, rubs or bruits.  Abdomen is soft.  He has good bowel sounds.  There is some tenderness in the upper right quadrant of the abdomen.  There might be some slight distention.  No fullness is noted.  There is no fluid wave.  There is no inguinal  adenopathy bilaterally.  There is no palpable liver or spleen tip.  Back exam shows a laminectomy scar in the lumbar spine.  Extremities shows no clubbing, cyanosis or edema.  He has good range of motion of his joints.  Skin exam shows no rashes, ecchymoses or petechia.  Neurological exam shows no focal neurological deficits.   _0 @   Recent Labs    02/21/18 1337  WBC 7.1  HGB 12.9*  HCT 40.6  PLT 476*   Recent Labs    02/21/18 1337  NA 136  K 3.1*  CL 93*  CO2 30  GLUCOSE 127*  BUN 21  CREATININE 1.34*  CALCIUM 9.9    Blood smear review: None  Pathology: See above    Assessment and Plan: Mr. Sandlin is a very nice 62 year old white male.  He has a poorly differentiated adenocarcinoma of the gastric body.  We really need to find out if we can take him to surgery initially.  As such, a PET scan will be done.  I do not think we need to do a endoscopic ultrasound.  This certainly could be a possibility.  By the CT scan, it does not look like there is any obvious locally advanced disease.  Depending on the PET scan, we will either try to get him straight to surgery and then consider adjuvant therapy or consider neoadjuvant therapy followed by surgery.  He is in very good shape.  Mr. Daft certainly would be able to tolerate aggressive intervention.  Hopefully,  we are dealing with a stage I or maybe stage II malignancy.  I will send the pathology off to see if the tumor is HER-2 positive.  I will also send the tumor off for molecular profiling.  We will test for PD-L1.  We will see what the tumor mutation burden is.  I spent about an hour with Mr. Zenon and his wife.  Answered all the questions.  I spent all the time face-to-face with them.  I reviewed the pathology.  I reviewed the CT scan.  I tried to reassure them as much as I could.  We certainly have the possibility of curing this from what I have seen so far.  He is a Psychologist, sport and exercise.  He is quite sturdy.  He is  very stout.  As such, I think we deftly have the ability to be aggressive with our recommendations.  We will try to get the PET scan set up for next week.  I will try to move this along as quickly as possible.

## 2018-02-22 LAB — IRON AND TIBC
IRON: 25 ug/dL — AB (ref 45–182)
Saturation Ratios: 10 % — ABNORMAL LOW (ref 17.9–39.5)
TIBC: 260 ug/dL (ref 250–450)
UIBC: 235 ug/dL

## 2018-02-22 LAB — CANCER ANTIGEN 19-9: CA 19-9: 5 U/mL (ref 0–35)

## 2018-02-22 LAB — FERRITIN: Ferritin: 443 ng/mL — ABNORMAL HIGH (ref 24–336)

## 2018-02-24 ENCOUNTER — Other Ambulatory Visit: Payer: Self-pay | Admitting: *Deleted

## 2018-02-24 ENCOUNTER — Encounter: Payer: Self-pay | Admitting: *Deleted

## 2018-02-24 DIAGNOSIS — C269 Malignant neoplasm of ill-defined sites within the digestive system: Secondary | ICD-10-CM

## 2018-02-24 DIAGNOSIS — R1032 Left lower quadrant pain: Secondary | ICD-10-CM

## 2018-02-24 DIAGNOSIS — C165 Malignant neoplasm of lesser curvature of stomach, unspecified: Secondary | ICD-10-CM

## 2018-02-24 LAB — CEA (IN HOUSE-CHCC): CEA (CHCC-In House): 1.12 ng/mL (ref 0.00–5.00)

## 2018-02-24 MED ORDER — OXYCODONE-ACETAMINOPHEN 7.5-325 MG PO TABS: ORAL_TABLET | ORAL | 0 refills | Status: DC

## 2018-02-24 MED ORDER — SUCRALFATE 1 GM/10ML PO SUSP: 1.0000 g | Freq: Three times a day (TID) | ORAL | 0 refills | Status: DC

## 2018-02-24 NOTE — Progress Notes (Signed)
Spoke with Dr Marin Olp and the only additional testing needed at this time is a PET scan. Order is in the computer.  Spoke with [employer] and they stated that the PET scan had been approved. Family would like scan scheduled sooner than later. Location was not a factor.  Scheduled PET scan for:  02/27/2018 at 12:30p Patient to arrive at 12:00 [facility] [place]   Patient educated on NPO status after 6am. He was also instructed to keep his am meal low carb - like scrambled eggs. He understood.  All the above confirmed with teach back.

## 2018-02-27 ENCOUNTER — Ambulatory Visit
Admission: RE | Admit: 2018-02-27 | Discharge: 2018-02-27 | Disposition: A | Discharge status: Home / Self Care | Payer: Medicare Other | Source: Ambulatory Visit | Attending: Hematology & Oncology | Admitting: Hematology & Oncology

## 2018-02-27 DIAGNOSIS — R188 Other ascites: Secondary | ICD-10-CM | POA: Diagnosis not present

## 2018-02-27 DIAGNOSIS — Z85028 Personal history of other malignant neoplasm of stomach: Secondary | ICD-10-CM | POA: Diagnosis present

## 2018-02-27 LAB — GLUCOSE, CAPILLARY: Glucose-Capillary: 114 mg/dL — ABNORMAL HIGH (ref 70–99)

## 2018-02-27 MED ORDER — FLUDEOXYGLUCOSE F - 18 (FDG) INJECTION
11.3500 | Freq: Once | INTRAVENOUS | Status: AC | PRN
  Administered 2018-02-27: 11.35 via INTRAVENOUS

## 2018-02-28 ENCOUNTER — Inpatient Hospital Stay (HOSPITAL_BASED_OUTPATIENT_CLINIC_OR_DEPARTMENT_OTHER): Payer: Medicare Other | Admitting: Hematology & Oncology

## 2018-02-28 ENCOUNTER — Encounter: Payer: Self-pay | Admitting: Hematology & Oncology

## 2018-02-28 ENCOUNTER — Inpatient Hospital Stay: Payer: Medicare Other

## 2018-02-28 ENCOUNTER — Encounter: Payer: Self-pay | Admitting: *Deleted

## 2018-02-28 ENCOUNTER — Other Ambulatory Visit: Payer: Self-pay

## 2018-02-28 VITALS — BP 122/78 | HR 103 | Temp 99.0°F | Resp 18 | Wt 203.0 lb

## 2018-02-28 DIAGNOSIS — E860 Dehydration: Secondary | ICD-10-CM

## 2018-02-28 DIAGNOSIS — C165 Malignant neoplasm of lesser curvature of stomach, unspecified: Secondary | ICD-10-CM | POA: Diagnosis not present

## 2018-02-28 DIAGNOSIS — R110 Nausea: Secondary | ICD-10-CM | POA: Diagnosis not present

## 2018-02-28 DIAGNOSIS — C162 Malignant neoplasm of body of stomach: Secondary | ICD-10-CM | POA: Diagnosis not present

## 2018-02-28 DIAGNOSIS — Z87891 Personal history of nicotine dependence: Secondary | ICD-10-CM | POA: Diagnosis not present

## 2018-02-28 DIAGNOSIS — D500 Iron deficiency anemia secondary to blood loss (chronic): Secondary | ICD-10-CM

## 2018-02-28 DIAGNOSIS — R109 Unspecified abdominal pain: Secondary | ICD-10-CM

## 2018-02-28 HISTORY — DX: Iron deficiency anemia secondary to blood loss (chronic): D50.0

## 2018-02-28 MED ORDER — PROCHLORPERAZINE MALEATE 10 MG PO TABS: 10.0000 mg | ORAL_TABLET | Freq: Four times a day (QID) | ORAL | 1 refills | Status: AC | PRN

## 2018-02-28 MED ORDER — SODIUM CHLORIDE 0.9 % IV SOLN
INTRAVENOUS | Status: AC
  Filled 2018-02-28: qty 250

## 2018-02-28 MED ORDER — METOCLOPRAMIDE HCL 5 MG/ML IJ SOLN
INTRAMUSCULAR | Status: AC
  Filled 2018-02-28: qty 4

## 2018-02-28 MED ORDER — SODIUM CHLORIDE 0.9 % IV SOLN
1000.0000 mL | INTRAVENOUS | Status: AC
  Administered 2018-02-28: 1000 mL via INTRAVENOUS
  Filled 2018-02-28: qty 1000

## 2018-02-28 MED ORDER — ONDANSETRON HCL 8 MG PO TABS: 8.0000 mg | ORAL_TABLET | Freq: Two times a day (BID) | ORAL | 1 refills | Status: AC | PRN

## 2018-02-28 MED ORDER — SODIUM CHLORIDE 0.9 % IV SOLN
40.0000 mg | Freq: Once | INTRAVENOUS | Status: AC
  Administered 2018-02-28: 40 mg via INTRAVENOUS
  Filled 2018-02-28: qty 4

## 2018-02-28 MED ORDER — SODIUM CHLORIDE 0.9 % IV SOLN
510.0000 mg | Freq: Once | INTRAVENOUS | Status: AC
  Administered 2018-02-28: 510 mg via INTRAVENOUS
  Filled 2018-02-28: qty 17

## 2018-02-28 MED ORDER — LIDOCAINE-PRILOCAINE 2.5-2.5 % EX CREA: TOPICAL_CREAM | CUTANEOUS | 3 refills | Status: AC

## 2018-02-28 MED ORDER — SODIUM CHLORIDE 0.9 % IV SOLN
Freq: Once | INTRAVENOUS | Status: AC
  Filled 2018-02-28: qty 250

## 2018-02-28 MED ORDER — PANCRELIPASE (LIP-PROT-AMYL) 36000-114000 UNITS PO CPEP: 72000.0000 [IU] | ORAL_CAPSULE | Freq: Three times a day (TID) | ORAL | 3 refills | Status: AC

## 2018-02-28 MED ORDER — HYDROMORPHONE HCL 2 MG PO TABS: 2.0000 mg | ORAL_TABLET | ORAL | 0 refills | Status: DC | PRN

## 2018-02-28 MED ORDER — METOCLOPRAMIDE HCL 5 MG/ML IJ SOLN
20.0000 mg | Freq: Once | INTRAVENOUS | Status: AC
  Administered 2018-02-28: 20 mg via INTRAVENOUS
  Filled 2018-02-28: qty 4

## 2018-02-28 MED ORDER — HYDROMORPHONE HCL 4 MG/ML IJ SOLN
INTRAMUSCULAR | Status: AC
  Filled 2018-02-28: qty 1

## 2018-02-28 MED ORDER — METOCLOPRAMIDE HCL 10 MG PO TABS: 20.0000 mg | ORAL_TABLET | Freq: Three times a day (TID) | ORAL | 2 refills | Status: AC

## 2018-02-28 MED ORDER — HYDROMORPHONE HCL 1 MG/ML IJ SOLN
2.0000 mg | INTRAMUSCULAR | Status: DC | PRN
  Administered 2018-02-28: 2 mg via INTRAVENOUS

## 2018-02-28 NOTE — Patient Instructions (Signed)
Ferumoxytol injection What is this medicine? FERUMOXYTOL is an iron complex. Iron is used to make healthy red blood cells, which carry oxygen and nutrients throughout the body. This medicine is used to treat iron deficiency anemia. This medicine may be used for other purposes; ask your health care provider or pharmacist if you have questions. COMMON BRAND NAME(S): Feraheme What should I tell my health care provider before I take this medicine? They need to know if you have any of these conditions: -anemia not caused by low iron levels -high levels of iron in the blood -magnetic resonance imaging (MRI) test scheduled -an unusual or allergic reaction to iron, other medicines, foods, dyes, or preservatives -pregnant or trying to get pregnant -breast-feeding How should I use this medicine? This medicine is for injection into a vein. It is given by a health care professional in a hospital or clinic setting. Talk to your pediatrician regarding the use of this medicine in children. Special care may be needed. Overdosage: If you think you have taken too much of this medicine contact a poison control center or emergency room at once. NOTE: This medicine is only for you. Do not share this medicine with others. What if I miss a dose? It is important not to miss your dose. Call your doctor or health care professional if you are unable to keep an appointment. What may interact with this medicine? This medicine may interact with the following medications: -other iron products This list may not describe all possible interactions. Give your health care provider a list of all the medicines, herbs, non-prescription drugs, or dietary supplements you use. Also tell them if you smoke, drink alcohol, or use illegal drugs. Some items may interact with your medicine. What should I watch for while using this medicine? Visit your doctor or healthcare professional regularly. Tell your doctor or healthcare  professional if your symptoms do not start to get better or if they get worse. You may need blood work done while you are taking this medicine. You may need to follow a special diet. Talk to your doctor. Foods that contain iron include: whole grains/cereals, dried fruits, beans, or peas, leafy green vegetables, and organ meats (liver, kidney). What side effects may I notice from receiving this medicine? Side effects that you should report to your doctor or health care professional as soon as possible: -allergic reactions like skin rash, itching or hives, swelling of the face, lips, or tongue -breathing problems -changes in blood pressure -feeling faint or lightheaded, falls -fever or chills -flushing, sweating, or hot feelings -swelling of the ankles or feet Side effects that usually do not require medical attention (report to your doctor or health care professional if they continue or are bothersome): -diarrhea -headache -nausea, vomiting -stomach pain This list may not describe all possible side effects. Call your doctor for medical advice about side effects. You may report side effects to FDA at 1-800-FDA-1088. Where should I keep my medicine? This drug is given in a hospital or clinic and will not be stored at home. NOTE: This sheet is a summary. It may not cover all possible information. If you have questions about this medicine, talk to your doctor, pharmacist, or health care provider.  2019 Elsevier/Gold Standard (2016-04-09 20:21:10) Dehydration, Adult  Dehydration is a condition in which there is not enough fluid or water in the body. This happens when you lose more fluids than you take in. Important organs, such as the kidneys, brain, and heart, cannot  function without a proper amount of fluids. Any loss of fluids from the body can lead to dehydration. Dehydration can range from mild to severe. This condition should be treated right away to prevent it from becoming severe. What  are the causes? This condition may be caused by:  Vomiting.  Diarrhea.  Excessive sweating, such as from heat exposure or exercise.  Not drinking enough fluid, especially: ? When ill. ? While doing activity that requires a lot of energy.  Excessive urination.  Fever.  Infection.  Certain medicines, such as medicines that cause the body to lose excess fluid (diuretics).  Inability to access safe drinking water.  Reduced physical ability to get adequate water and food. What increases the risk? This condition is more likely to develop in people:  Who have a poorly controlled long-term (chronic) illness, such as diabetes, heart disease, or kidney disease.  Who are age 64 or older.  Who are disabled.  Who live in a place with high altitude.  Who play endurance sports. What are the signs or symptoms? Symptoms of mild dehydration may include:  Thirst.  Dry lips.  Slightly dry mouth.  Dry, warm skin.  Dizziness. Symptoms of moderate dehydration may include:  Very dry mouth.  Muscle cramps.  Dark urine. Urine may be the color of tea.  Decreased urine production.  Decreased tear production.  Heartbeat that is irregular or faster than normal (palpitations).  Headache.  Light-headedness, especially when you stand up from a sitting position.  Fainting (syncope). Symptoms of severe dehydration may include:  Changes in skin, such as: ? Cold and clammy skin. ? Blotchy (mottled) or pale skin. ? Skin that does not quickly return to normal after being lightly pinched and released (poor skin turgor).  Changes in body fluids, such as: ? Extreme thirst. ? No tear production. ? Inability to sweat when body temperature is high, such as in hot weather. ? Very little urine production.  Changes in vital signs, such as: ? Weak pulse. ? Pulse that is more than 100 beats a minute when sitting still. ? Rapid breathing. ? Low blood pressure.  Other changes, such  as: ? Sunken eyes. ? Cold hands and feet. ? Confusion. ? Lack of energy (lethargy). ? Difficulty waking up from sleep. ? Short-term weight loss. ? Unconsciousness. How is this diagnosed? This condition is diagnosed based on your symptoms and a physical exam. Blood and urine tests may be done to help confirm the diagnosis. How is this treated? Treatment for this condition depends on the severity. Mild or moderate dehydration can often be treated at home. Treatment should be started right away. Do not wait until dehydration becomes severe. Severe dehydration is an emergency and it needs to be treated in a hospital. Treatment for mild dehydration may include:  Drinking more fluids.  Replacing salts and minerals in your blood (electrolytes) that you may have lost. Treatment for moderate dehydration may include:  Drinking an oral rehydration solution (ORS). This is a drink that helps you replace fluids and electrolytes (rehydrate). It can be found at pharmacies and retail stores. Treatment for severe dehydration may include:  Receiving fluids through an IV tube.  Receiving an electrolyte solution through a feeding tube that is passed through your nose and into your stomach (nasogastric tube, or NG tube).  Correcting any abnormalities in electrolytes.  Treating the underlying cause of dehydration. Follow these instructions at home:  If directed by your health care provider, drink an ORS: ? Make  an ORS by following instructions on the package. ? Start by drinking small amounts, about  cup (120 mL) every 5-10 minutes. ? Slowly increase how much you drink until you have taken the amount recommended by your health care provider.  Drink enough clear fluid to keep your urine clear or pale yellow. If you were told to drink an ORS, finish the ORS first, then start slowly drinking other clear fluids. Drink fluids such as: ? Water. Do not drink only water. Doing that can lead to having too  little salt (sodium) in the body (hyponatremia). ? Ice chips. ? Fruit juice that you have added water to (diluted fruit juice). ? Low-calorie sports drinks.  Avoid: ? Alcohol. ? Drinks that contain a lot of sugar. These include high-calorie sports drinks, fruit juice that is not diluted, and soda. ? Caffeine. ? Foods that are greasy or contain a lot of fat or sugar.  Take over-the-counter and prescription medicines only as told by your health care provider.  Do not take sodium tablets. This can lead to having too much sodium in the body (hypernatremia).  Eat foods that contain a healthy balance of electrolytes, such as bananas, oranges, potatoes, tomatoes, and spinach.  Keep all follow-up visits as told by your health care provider. This is important. Contact a health care provider if:  You have abdominal pain that: ? Gets worse. ? Stays in one area (localizes).  You have a rash.  You have a stiff neck.  You are more irritable than usual.  You are sleepier or more difficult to wake up than usual.  You feel weak or dizzy.  You feel very thirsty.  You have urinated only a small amount of very dark urine over 6-8 hours. Get help right away if:  You have symptoms of severe dehydration.  You cannot drink fluids without vomiting.  Your symptoms get worse with treatment.  You have a fever.  You have a severe headache.  You have vomiting or diarrhea that: ? Gets worse. ? Does not go away.  You have blood or green matter (bile) in your vomit.  You have blood in your stool. This may cause stool to look black and tarry.  You have not urinated in 6-8 hours.  You faint.  Your heart rate while sitting still is over 100 beats a minute.  You have trouble breathing. This information is not intended to replace advice given to you by your health care provider. Make sure you discuss any questions you have with your health care provider. Document Released: 02/19/2005  Document Revised: 09/16/2015 Document Reviewed: 04/15/2015 Elsevier Interactive Patient Education  2019 [employer].

## 2018-02-28 NOTE — Progress Notes (Signed)
START ON PATHWAY REGIMEN - Gastroesophageal     A cycle is every 14 days:     Docetaxel      Oxaliplatin      Leucovorin      5-Fluorouracil   **Always confirm dose/schedule in your pharmacy ordering system**  Patient Characteristics: Gastric, Adenocarcinoma, Preoperative or Nonsurgical Candidate (Clinical Staging), cT3 or Higher or cN+, Surgical Candidate (Up to cT4a) - Preoperative Therapy Histology: Adenocarcinoma Disease Classification: Gastric Therapeutic Status: Preoperative or Nonsurgical Candidate (Clinical Staging) AJCC N Category: cN1 AJCC M Category: cM0 AJCC 8 Stage Grouping: IIA AJCC T Category: cT2 Intent of Therapy: Curative Intent, Discussed with Patient

## 2018-02-28 NOTE — Progress Notes (Signed)
Hematology and Oncology Follow Up Visit  Steven Ortega [id number] 1955/09/02 62 y.o. 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  We actually were able to get his PET scan done yesterday.  I am very impressed with radiology's ability to do this.  The PET scan showed that there was activity in the lesser curvature gastric antrum.  There were some small abdominal lymph nodes surrounding the stomach.  There is no evidence of obvious metastatic disease.  I thought that Steven Ortega would be a great candidate for neoadjuvant chemotherapy.  I think that he is in good shape.  His prealbumin is 21.2.  He is having some problems with abdominal discomfort.  He is having a more difficult time trying to eat.  He is having nausea.  I think there might be occasional vomiting.  He says that his pain medicine really does not do all that much.  He is on oxycodone.  He is on Carafate.  He is on Protonix.  He looks little bit dehydrated.  As such, I will go ahead and give him IV fluids.  With the IV fluids I will give him some IV Pepcid, Dilaudid, and Reglan.  Actually, this all helped him quite a bit.  He felt much better after he received the IV fluids with the additions.  Hopefully, he will do better at home with oral Dilaudid and oral Reglan.  I am going to give him some Creon to see if this can also aid with digestion.  We actually did chemotherapy teaching for him today.  I was able to get a Port-A-Cath placed on December 31.  Again, radiology really came through for [place].  He has had no bleeding.  He is a little bit iron deficient.  His ferritin is 443 with an iron saturation of only 10%.  I did go ahead and give him a dose of IV iron.  I knew that he would need this.  There is been no fever.  Overall, I would say his  performance status is ECOG 1.  Medications:  Current Outpatient Medications:  .  acyclovir (ZOVIRAX) 800 MG tablet, Take 400 mg by mouth daily as needed (fever blisters). , Disp: , Rfl:  .  ALPRAZolam (XANAX) 1 MG tablet, Take 1 mg by mouth at bedtime. , Disp: , Rfl:  .  buPROPion (ZYBAN) 150 MG 12 hr tablet, Take 150 mg by mouth 2 (two) times daily., Disp: , Rfl:  .  Cholecalciferol 2000 UNITS CAPS, Take 2,000 Units by mouth daily. , Disp: , Rfl:  .  citalopram (CELEXA) 40 MG tablet, Take 40 mg by mouth daily. , Disp: , Rfl:  .  cyclobenzaprine (FLEXERIL) 10 MG tablet, Take 10 mg by mouth at bedtime., Disp: , Rfl:  .  docusate sodium (STOOL SOFTENER) 100 MG capsule, Take 100 mg by mouth daily. , Disp: , Rfl:  .  HYDROmorphone (DILAUDID) 2 MG tablet, Take 1 tablet (2 mg total) by mouth every 4 (four) hours as needed for severe pain., Disp: 60 tablet, Rfl: 0 .  levothyroxine (SYNTHROID, LEVOTHROID) 25 MCG tablet, Take 25 mcg by mouth daily before breakfast., Disp: , Rfl:  .  lidocaine-prilocaine (EMLA) cream, Apply to affected area once, Disp: 30 g, Rfl: 3 .  lipase/protease/amylase (CREON) 36000 UNITS CPEP  capsule, Take 2 capsules (72,000 Units total) by mouth 3 (three) times daily before meals., Disp: 180 capsule, Rfl: 3 .  metoCLOPramide (REGLAN) 10 MG tablet, Take 2 tablets (20 mg total) by mouth 4 (four) times daily -  before meals and at bedtime., Disp: 120 tablet, Rfl: 2 .  ondansetron (ZOFRAN) 8 MG tablet, Take 1 tablet (8 mg total) by mouth 2 (two) times daily as needed for refractory nausea / vomiting. Start on day 3 after chemotherapy., Disp: 30 tablet, Rfl: 1 .  pantoprazole (PROTONIX) 40 MG tablet, Take 1 tablet (40 mg total) by mouth 2 (two) times daily before a meal., Disp: 60 tablet, Rfl: 3 .  polyethylene glycol (MIRALAX / GLYCOLAX) packet, Take 17 g by mouth daily., Disp: , Rfl:  .  prochlorperazine (COMPAZINE) 10 MG tablet, Take 1 tablet (10 mg total) by mouth every 6 (six) hours  as needed (Nausea or vomiting)., Disp: 30 tablet, Rfl: 1 .  sucralfate (CARAFATE) 1 GM/10ML suspension, Take 10 mLs (1 g total) by mouth 4 (four) times daily -  with meals and at bedtime., Disp: 420 mL, Rfl: 0 .  tamsulosin (FLOMAX) 0.4 MG CAPS capsule, Take 1 capsule (0.4 mg total) by mouth daily., Disp: 30 capsule, Rfl: 0  Current Facility-Administered Medications:  .  0.9 %  sodium chloride infusion, 500 mL, Intravenous, Once, Steven Park, MD .  HYDROmorphone (DILAUDID) injection 2 mg, 2 mg, Intravenous, Q2H PRN, Volanda Napoleon, MD, 2 mg at 02/28/18 1425  Allergies: No Known Allergies  Past Medical History, Surgical history, Social history, and Family History were reviewed and updated.  Review of Systems: Review of Systems  Constitutional: Negative.   HENT:  Negative.   Eyes: Negative.   Respiratory: Negative.   Cardiovascular: Negative.   Gastrointestinal: Positive for abdominal pain, nausea and vomiting.  Endocrine: Negative.   Genitourinary: Negative.    Musculoskeletal: Negative.   Skin: Negative.   Neurological: Negative.   Hematological: Negative.   Psychiatric/Behavioral: Negative.     Physical Exam:  weight is 203 lb (92.1 kg). His oral temperature is 99 F (37.2 C). His blood pressure is 122/78 and his pulse is 103 (abnormal). His respiration is 18 and oxygen saturation is 98%.   Wt Readings from Last 3 Encounters:  02/28/18 203 lb (92.1 kg)  02/21/18 209 lb (94.8 kg)  02/10/18 213 lb (96.6 kg)    Physical Exam Vitals signs reviewed.  HENT:     Head: Normocephalic and atraumatic.  Eyes:     Pupils: Pupils are equal, round, and reactive to light.  Neck:     Musculoskeletal: Normal range of motion.  Cardiovascular:     Rate and Rhythm: Normal rate and regular rhythm.     Heart sounds: Normal heart sounds.  Pulmonary:     Effort: Pulmonary effort is normal.     Breath sounds: Normal breath sounds.  Abdominal:     General: Bowel sounds are normal.      Palpations: Abdomen is soft.     Comments: His abdominal exam is soft.  There is some tenderness in the epigastric and right upper quadrant.  Bowel sounds are present.  There is no fluid wave.  There is no guarding or rebound tenderness.  There is no palpable hepatomegaly.  His spleen tip is not palpable to palpation.  Musculoskeletal: Normal range of motion.        General: No tenderness or deformity.  Lymphadenopathy:     Cervical: No cervical adenopathy.  Skin:    General: Skin is warm and dry.     Findings: No erythema or rash.  Neurological:     Mental Status: He is alert and oriented to person, place, and time.  Psychiatric:        Behavior: Behavior normal.        Thought Content: Thought content normal.        Judgment: Judgment normal.      Lab Results  Component Value Date   WBC 7.1 02/21/2018   HGB 12.9 (L) 02/21/2018   HCT 40.6 02/21/2018   MCV 86.9 02/21/2018   PLT 476 (H) 02/21/2018     Chemistry      Component Value Date/Time   NA 136 02/21/2018 1337   K 3.1 (L) 02/21/2018 1337   CL 93 (L) 02/21/2018 1337   CO2 30 02/21/2018 1337   BUN 21 02/21/2018 1337   CREATININE 1.34 (H) 02/21/2018 1337      Component Value Date/Time   CALCIUM 9.9 02/21/2018 1337   ALKPHOS 65 02/21/2018 1337   AST 13 (L) 02/21/2018 1337   ALT 10 02/21/2018 1337   BILITOT 0.4 02/21/2018 1337      Impression and Plan: Steven Ortega is a 62 year old white male.  He has gastric adenocarcinoma.  It looks like he may have clinical stage IIa disease.  I believe that he would be a good candidate for treatment in the neoadjuvant setting.  I know that the big randomized clinical trial that used FLOT in the neoadjuvant and adjuvant setting did show an improvement in survival.  Again, I do not see any obvious metastatic disease.  We will do 4 cycles of treatment.  Hopefully, we will see that he is clinically responding by a decrease in pain, decrease in nausea and able to eat  better.  I spent about an hour with he and his wife.  Again we also gave him IV fluids.  This did seem to help.  I am glad that he was more comfortable after he left our office.  Hopefully, this will last a while.  I am incredibly indebted to radiology for being able to fit him into their incredibly busy schedule at the end of the year so that we can try to get his treatment going.  I will plan to see him back when he has his second cycle of treatment.  Each cycle of FLOT is 2 weeks.   Volanda Napoleon, MD 12/27/20194:47 PM

## 2018-03-03 ENCOUNTER — Other Ambulatory Visit: Payer: Self-pay | Admitting: Radiology

## 2018-03-03 ENCOUNTER — Other Ambulatory Visit: Payer: Self-pay | Admitting: Hematology & Oncology

## 2018-03-04 ENCOUNTER — Other Ambulatory Visit: Payer: Self-pay | Admitting: Hematology & Oncology

## 2018-03-04 ENCOUNTER — Ambulatory Visit (HOSPITAL_COMMUNITY)
Admission: RE | Admit: 2018-03-04 | Discharge: 2018-03-04 | Disposition: A | Discharge status: Home / Self Care | Payer: Medicare Other | Source: Ambulatory Visit | Attending: Hematology & Oncology | Admitting: Hematology & Oncology

## 2018-03-04 ENCOUNTER — Encounter (HOSPITAL_COMMUNITY): Payer: Self-pay | Admitting: Hematology & Oncology

## 2018-03-04 ENCOUNTER — Other Ambulatory Visit: Payer: Self-pay | Admitting: *Deleted

## 2018-03-04 ENCOUNTER — Other Ambulatory Visit: Payer: Self-pay

## 2018-03-04 ENCOUNTER — Encounter (HOSPITAL_COMMUNITY): Payer: Self-pay

## 2018-03-04 DIAGNOSIS — M1990 Unspecified osteoarthritis, unspecified site: Secondary | ICD-10-CM | POA: Diagnosis not present

## 2018-03-04 DIAGNOSIS — C165 Malignant neoplasm of lesser curvature of stomach, unspecified: Secondary | ICD-10-CM

## 2018-03-04 DIAGNOSIS — Z801 Family history of malignant neoplasm of trachea, bronchus and lung: Secondary | ICD-10-CM | POA: Insufficient documentation

## 2018-03-04 DIAGNOSIS — I10 Essential (primary) hypertension: Secondary | ICD-10-CM | POA: Insufficient documentation

## 2018-03-04 DIAGNOSIS — Z8249 Family history of ischemic heart disease and other diseases of the circulatory system: Secondary | ICD-10-CM | POA: Diagnosis not present

## 2018-03-04 DIAGNOSIS — E785 Hyperlipidemia, unspecified: Secondary | ICD-10-CM | POA: Diagnosis not present

## 2018-03-04 DIAGNOSIS — Z87891 Personal history of nicotine dependence: Secondary | ICD-10-CM | POA: Insufficient documentation

## 2018-03-04 DIAGNOSIS — Z79899 Other long term (current) drug therapy: Secondary | ICD-10-CM | POA: Diagnosis not present

## 2018-03-04 DIAGNOSIS — Z79890 Hormone replacement therapy (postmenopausal): Secondary | ICD-10-CM | POA: Diagnosis not present

## 2018-03-04 DIAGNOSIS — E039 Hypothyroidism, unspecified: Secondary | ICD-10-CM | POA: Insufficient documentation

## 2018-03-04 DIAGNOSIS — H9190 Unspecified hearing loss, unspecified ear: Secondary | ICD-10-CM | POA: Diagnosis not present

## 2018-03-04 DIAGNOSIS — K219 Gastro-esophageal reflux disease without esophagitis: Secondary | ICD-10-CM | POA: Diagnosis not present

## 2018-03-04 HISTORY — PX: IR IMAGING GUIDED PORT INSERTION: IMG5740

## 2018-03-04 LAB — BASIC METABOLIC PANEL
Anion gap: 12 (ref 5–15)
BUN: 13 mg/dL (ref 8–23)
CO2: 26 mmol/L (ref 22–32)
Calcium: 9.4 mg/dL (ref 8.9–10.3)
Chloride: 98 mmol/L (ref 98–111)
Creatinine, Ser: 1.12 mg/dL (ref 0.61–1.24)
GFR calc Af Amer: >60 mL/min (ref >60)
GFR calc non Af Amer: >60 mL/min (ref >60)
Glucose, Bld: 99 mg/dL (ref 70–99)
Potassium: 3.1 mmol/L — ABNORMAL LOW (ref 3.5–5.1)
Sodium: 136 mmol/L (ref 135–145)

## 2018-03-04 LAB — CBC WITH DIFFERENTIAL/PLATELET
Abs Immature Granulocytes: 0.04 10*3/uL (ref 0.00–0.07)
BASOS ABS: 0.1 10*3/uL (ref 0.0–0.1)
Basophils Relative: 1 %
EOS ABS: 0.2 10*3/uL (ref 0.0–0.5)
Eosinophils Relative: 3 %
HCT: 36.5 % — ABNORMAL LOW (ref 39.0–52.0)
Hemoglobin: 11.5 g/dL — ABNORMAL LOW (ref 13.0–17.0)
Immature Granulocytes: 1 %
LYMPHS ABS: 1 10*3/uL (ref 0.7–4.0)
Lymphocytes Relative: 15 %
MCH: 27.8 pg (ref 26.0–34.0)
MCHC: 31.5 g/dL (ref 30.0–36.0)
MCV: 88.4 fL (ref 80.0–100.0)
Monocytes Absolute: 0.8 10*3/uL (ref 0.1–1.0)
Monocytes Relative: 13 %
NRBC: 0 % (ref 0.0–0.2)
Neutro Abs: 4.4 10*3/uL (ref 1.7–7.7)
Neutrophils Relative %: 67 %
Platelets: 369 10*3/uL (ref 150–400)
RBC: 4.13 MIL/uL — ABNORMAL LOW (ref 4.22–5.81)
RDW: 12.8 % (ref 11.5–15.5)
WBC: 6.5 10*3/uL (ref 4.0–10.5)

## 2018-03-04 LAB — PROTIME-INR
INR: 1
Prothrombin Time: 13.1 seconds (ref 11.4–15.2)

## 2018-03-04 MED ORDER — HEPARIN SOD (PORK) LOCK FLUSH 100 UNIT/ML IV SOLN
INTRAVENOUS | Status: AC
  Filled 2018-03-04: qty 5

## 2018-03-04 MED ORDER — MIDAZOLAM HCL 2 MG/2ML IJ SOLN
INTRAMUSCULAR | Status: AC
  Filled 2018-03-04: qty 4

## 2018-03-04 MED ORDER — CEFAZOLIN SODIUM-DEXTROSE 2-4 GM/100ML-% IV SOLN
INTRAVENOUS | Status: AC
  Administered 2018-03-04: 2 g via INTRAVENOUS
  Filled 2018-03-04: qty 100

## 2018-03-04 MED ORDER — FENTANYL CITRATE (PF) 100 MCG/2ML IJ SOLN
INTRAMUSCULAR | Status: AC | PRN
  Administered 2018-03-04 (×2): 50 ug via INTRAVENOUS

## 2018-03-04 MED ORDER — SODIUM CHLORIDE 0.9 % IV SOLN
INTRAVENOUS | Status: DC
  Administered 2018-03-04: 13:00:00 via INTRAVENOUS

## 2018-03-04 MED ORDER — HEPARIN SOD (PORK) LOCK FLUSH 100 UNIT/ML IV SOLN
INTRAVENOUS | Status: AC | PRN
  Administered 2018-03-04: 500 [IU] via INTRAVENOUS

## 2018-03-04 MED ORDER — FENTANYL CITRATE (PF) 100 MCG/2ML IJ SOLN
INTRAMUSCULAR | Status: AC
  Filled 2018-03-04: qty 2

## 2018-03-04 MED ORDER — LIDOCAINE-EPINEPHRINE (PF) 2 %-1:200000 IJ SOLN
INTRAMUSCULAR | Status: AC | PRN
  Administered 2018-03-04: 10 mL

## 2018-03-04 MED ORDER — MIDAZOLAM HCL 2 MG/2ML IJ SOLN
INTRAMUSCULAR | Status: AC | PRN
  Administered 2018-03-04 (×2): 1 mg via INTRAVENOUS

## 2018-03-04 MED ORDER — CEFAZOLIN SODIUM-DEXTROSE 2-4 GM/100ML-% IV SOLN
2.0000 g | INTRAVENOUS | Status: AC
  Administered 2018-03-04: 2 g via INTRAVENOUS

## 2018-03-04 MED ORDER — LIDOCAINE-EPINEPHRINE (PF) 2 %-1:200000 IJ SOLN
INTRAMUSCULAR | Status: AC
  Filled 2018-03-04: qty 20

## 2018-03-04 NOTE — Consult Note (Signed)
Chief Complaint: Patient was seen in consultation today for Port-A-Cath placement  Referring Physician(s): Ennever,Peter R  Supervising Physician: Arne Cleveland  Patient Status: [facility] - Out-pt  History of Present Illness: Steven Ortega is a 62 y.o. male with history of newly diagnosed gastric adenocarcinoma who presents today for Port-A-Cath placement for chemotherapy.  Past Medical History:  Diagnosis Date  . Adenomatous colon polyp   . Anal fissure   . Anemia   . Anxiety   . Arthritis   . Cancer of lesser curvature of stomach ([place]) 02/21/2018  . Constipation   . Depression   . Diverticulosis   . GERD (gastroesophageal reflux disease)   . Goals of care, counseling/discussion 02/21/2018  . Hearing aid worn    b/l  . HOH (hard of hearing)   . Hyperlipidemia   . Hypertension   . Hypothyroidism   . Iron deficiency anemia due to chronic blood loss 02/28/2018  . Kidney stones   . Kidney stones   . Pyloric erosion   . UNSPECIFIED ANEMIA 03/11/2008   Qualifier: Diagnosis of  By: Theda Sers CMA, Anderson Malta    . Wears dentures   . Wears glasses     Past Surgical History:  Procedure Laterality Date  . ANAL FISSURE REPAIR  2010  . APPENDECTOMY  1984  . COLONOSCOPY W/ BIOPSIES AND POLYPECTOMY    . HERNIA REPAIR    . LUMBAR FUSION  2013  . SHOULDER ARTHROSCOPY WITH SUBACROMIAL DECOMPRESSION, ROTATOR CUFF REPAIR AND BICEP TENDON REPAIR Left 12/09/2017   Procedure: LEFT SHOULDER ARTHROSCOPY SUBACROMIAL DECOMPRESSION, ROTATOR CUFF TEAR REPAIR;  Surgeon: Meredith Pel, MD;  Location: [facility];  Service: Orthopedics;  Laterality: Left;  . THUMB FUSION Left 2008  . THUMB FUSION Right 2008   x 2  . TRIGGER FINGER RELEASE Left 2008   2nd finger    Allergies: Patient has no known allergies.  Medications: Prior to Admission medications   Medication Sig Start Date End Date Taking? Authorizing Provider  acyclovir (ZOVIRAX) 800 MG tablet Take 400 mg by mouth daily as needed  (fever blisters).     [provider]  ALPRAZolam Duanne Moron) 1 MG tablet Take 1 mg by mouth at bedtime.     [provider]  buPROPion (ZYBAN) 150 MG 12 hr tablet Take 150 mg by mouth 2 (two) times daily.    [provider]  Cholecalciferol 2000 UNITS CAPS Take 2,000 Units by mouth daily.     [provider]  citalopram (CELEXA) 40 MG tablet Take 40 mg by mouth daily.     [provider]  cyclobenzaprine (FLEXERIL) 10 MG tablet Take 10 mg by mouth at bedtime.    [provider]  docusate sodium (STOOL SOFTENER) 100 MG capsule Take 100 mg by mouth daily.     [provider]  HYDROmorphone (DILAUDID) 2 MG tablet Take 1 tablet (2 mg total) by mouth every 4 (four) hours as needed for severe pain. 02/28/18   Volanda Napoleon, MD  levothyroxine (SYNTHROID, LEVOTHROID) 25 MCG tablet Take 25 mcg by mouth daily before breakfast.    [provider]  lidocaine-prilocaine (EMLA) cream Apply to affected area once 02/28/18   Ennever, Rudell Cobb, MD  lipase/protease/amylase (CREON) 36000 UNITS CPEP capsule Take 2 capsules (72,000 Units total) by mouth 3 (three) times daily before meals. 02/28/18   Volanda Napoleon, MD  metoCLOPramide (REGLAN) 10 MG tablet Take 2 tablets (20 mg total) by mouth 4 (four) times daily -  before meals and at bedtime. 02/28/18   Volanda Napoleon, MD  ondansetron (ZOFRAN) 8 MG tablet Take 1 tablet (8 mg total) by mouth 2 (two) times daily as needed for refractory nausea / vomiting. Start on day 3 after chemotherapy. 02/28/18   Volanda Napoleon, MD  pantoprazole (PROTONIX) 40 MG tablet Take 1 tablet (40 mg total) by mouth 2 (two) times daily before a meal. 02/06/18   Esterwood, Amy S, PA-C  polyethylene glycol (MIRALAX / GLYCOLAX) packet Take 17 g by mouth daily.    [provider]  prochlorperazine (COMPAZINE) 10 MG tablet Take 1 tablet (10 mg total) by mouth every 6 (six) hours as needed (Nausea or vomiting).  02/28/18   Volanda Napoleon, MD  sucralfate (CARAFATE) 1 GM/10ML suspension Take 10 mLs (1 g total) by mouth 4 (four) times daily -  with meals and at bedtime. 02/24/18   Volanda Napoleon, MD  tamsulosin (FLOMAX) 0.4 MG CAPS capsule Take 1 capsule (0.4 mg total) by mouth daily. 09/18/15   Okey Regal, PA-C     Family History  Problem Relation Age of Onset  . Heart attack Brother        x 2  . Heart attack Father   . Lung cancer Father   . Colon cancer Neg Hx     Social History   Socioeconomic History  . Marital status: Married    Spouse name: Not on file  . Number of children: 2  . Years of education: Not on file  . Highest education level: Not on file  Occupational History  . Occupation: retired  Scientific laboratory technician  . Financial resource strain: Not on file  . Food insecurity:    Worry: Not on file    Inability: Not on file  . Transportation needs:    Medical: Not on file    Non-medical: Not on file  Tobacco Use  . Smoking status: Former Smoker    Types: Cigarettes, Pipe    Last attempt to quit: 03/05/1998    Years since quitting: 20.0  . Smokeless tobacco: Former Systems developer    Types: [employer] date: 03/05/1993  Substance and Sexual Activity  . Alcohol use: Not Currently  . Drug use: No  . Sexual activity: Not on file  Lifestyle  . Physical activity:    Days per week: Not on file    Minutes per session: Not on file  . Stress: Not on file  Relationships  . Social connections:    Talks on phone: Not on file    Gets together: Not on file    Attends religious service: Not on file    Active member of club or organization: Not on file    Attends meetings of clubs or organizations: Not on file    Relationship status: Not on file  Other Topics Concern  . Not on file  Social History Narrative  . Not on file      Review of Systems currently denies fever, headache, chest pain, dyspnea, cough, abdominal pain, nausea, vomiting or bleeding.  He does have chronic back  pain  Vital Signs: Blood pressure 125/78, heart rate 97, respirations 18, temp 98.3, O2 sat 98% room air   Physical Exam awake, alert.  Chest clear to auscultation bilaterally.  Heart with regular rate and rhythm.  Abdomen soft, positive bowel sounds, nontender.  No lower extremity edema.  Imaging: Ct Abdomen Pelvis W Contrast  Result Date: 02/03/2018 CLINICAL DATA:  63 year old male with abdominal pain. EXAM: CT ABDOMEN AND PELVIS WITH CONTRAST TECHNIQUE: Multidetector CT imaging of the abdomen and pelvis was performed using the standard protocol following bolus administration of intravenous contrast. CONTRAST:  127mL OMNIPAQUE IOHEXOL 300 MG/ML  SOLN COMPARISON:  Abdominal ultrasound dated 02/03/2018 and CT dated 09/18/2015 FINDINGS: Lower chest: Minimal bibasilar dependent atelectatic changes. There is coronary vascular calcification. No intra-abdominal free air. Trace free fluid within pelvis and diffuse stranding of the mesentery in the upper abdomen. Hepatobiliary: Probable mild fatty infiltration of the liver. No intrahepatic biliary ductal dilatation. The gallbladder is partially contracted. There is a stone in the neck of the gallbladder. A punctate calculus noted along the gallbladder fundus. Focal 18 x 8 mm nodular density along the inferior aspect of the gallbladder wall appears similar to prior CT of indeterminate etiology. This appears external to the gallbladder lumen and may represent a focal area of adenomyomatosis, or a lymph node, or an area of scarring. Pancreas: Stranding of the mesentery in the upper abdomen likely related to infiltrative process involving the stomach. No definite evidence of acute pancreatitis. Correlation with pancreatic enzymes recommended. Spleen: Normal in size without focal abnormality. Adrenals/Urinary Tract: The adrenal glands are unremarkable. Multiple bilateral nonobstructing renal calculi measure up to 4-5 mm. There is no hydronephrosis on either side. The  visualized ureters and urinary bladder appear unremarkable. Stomach/Bowel: There is thickening of the distal stomach predominantly in the region of the gastric antrum and pylorus with mild thickening along the lesser curvature of the stomach concerning for an infiltrative neoplasm. Further evaluation with endoscopy and tissue sampling recommended. There is associated narrowing of the lumen of the distal stomach. There is stranding of the mesentery adjacent to the stomach with a mildly rounded 10 mm lymph node adjacent to the distal stomach. There is no evidence of small-bowel obstruction. There is sigmoid diverticulosis without active inflammatory changes. Appendectomy. Vascular/Lymphatic: Mild aortoiliac atherosclerotic disease. No portal venous gas. No retroperitoneal adenopathy. Reproductive: The prostate and seminal vesicles are grossly unremarkable. Other: None Musculoskeletal: Posterior lumbar fixation hardware. No acute osseous pathology. IMPRESSION: 1. Thickened appearance of the distal stomach and pylorus concerning for an infiltrative neoplasm. Further evaluation with endoscopy and tissue sampling recommended. There is stranding of the mesentery in the upper abdomen likely related to infiltrative process involving the stomach. 2. Cholelithiasis. 3. Bilateral nonobstructing renal calculi. No hydronephrosis. 4. Sigmoid diverticulosis without active inflammatory changes. Electronically Signed   By: Anner Crete M.D.   On: 02/03/2018 04:36   Nm Pet Image Initial (pi) Skull Base To Thigh  Result Date: 02/27/2018 CLINICAL DATA:  Initial treatment strategy for gastric cancer. EXAM: NUCLEAR MEDICINE PET SKULL BASE TO THIGH TECHNIQUE: 11.35 mCi F-18 FDG was injected intravenously. Full-ring PET imaging was performed from the skull base to thigh after the radiotracer. CT data was obtained and used for attenuation correction and anatomic localization. Fasting blood glucose: 114 mg/dl COMPARISON:  CT  abdomen/pelvis dated 02/03/2018 FINDINGS: Mediastinal blood pool activity: SUV max 3.1 NECK: No hypermetabolic cervical lymphadenopathy. Incidental CT findings: none CHEST: No suspicious pulmonary nodules. No hypermetabolic thoracic lymphadenopathy. Incidental CT findings: Mild atherosclerotic calcifications of the aortic arch. Three vessel coronary atherosclerosis. ABDOMEN/PELVIS: Focal hypermetabolism along the distal lesser curvature and gastric antrum of the stomach, corresponding to the patient's known infiltrating gastric neoplasm, max SUV 6.2. Adjacent mild mesenteric stranding inferiorly and along the proximal duodenum. 11 mm short axis node along hepatoduodenal ligament (series 3/image 141), max SUV 5.3. Additional mild hypermetabolism anterior  to the second portion of the duodenum, max SUV 5.6, without corresponding lymph node. 8 mm short axis portacaval node (series 3/image 144), max SUV 3.9. Trace ascites along the left pericolic gutter. Small volume pelvic ascites. No frank peritoneal nodularity or omental caking. Incidental CT findings: Atherosclerotic calcifications the abdominal aorta and branch vessels. Sigmoid diverticulosis, without evidence of diverticulitis. SKELETON: No focal hypermetabolic activity to suggest skeletal metastasis. Incidental CT findings: none IMPRESSION: Hypermetabolism along the distal lesser curvature and gastric antrum of the stomach, corresponding to the patient's known infiltrating gastric neoplasm. Small upper abdominal lymph nodes with surrounding mesenteric stranding, as above. Small volume abdominopelvic ascites. No frank peritoneal nodularity or omental caking. Electronically Signed   By: Julian Hy M.D.   On: 02/27/2018 12:33   US Abdomen Limited Ruq  Result Date: 02/03/2018 CLINICAL DATA:  62 year old male with right upper quadrant abdominal pain. EXAM: ULTRASOUND ABDOMEN LIMITED RIGHT UPPER QUADRANT COMPARISON:  CT dated 09/18/2015 FINDINGS: Gallbladder:  The gallbladder is partially contracted. A measured linear echogenic area in the region of the gallbladder likely represents the gallbladder wall. No definite stone, however evaluation is limited due to suboptimal visualization. No gallbladder wall thickening or pericholecystic fluid. Negative sonographic Murphy's sign. Common bile duct: Diameter: 3 mm Liver: Diffuse increased liver echogenicity. Portal vein is patent on color Doppler imaging with normal direction of blood flow towards the liver. IMPRESSION: 1. No definite gallstone or sonographic evidence of acute cholecystitis. 2. Fatty liver. Electronically Signed   By: Anner Crete M.D.   On: 02/03/2018 02:26    Labs:  CBC: Recent Labs    12/09/17 0727 02/03/18 0132 02/21/18 1337  WBC 4.7 7.5 7.1  HGB 12.8* 12.9* 12.9*  HCT 39.8 41.6 40.6  PLT 253 402* 476*    COAGS: No results for input(s): INR, APTT in the last 8760 hours.  BMP: Recent Labs    12/09/17 0727 02/03/18 0132 02/21/18 1337  NA 135 132* 136  K 3.7 3.3* 3.1*  CL 106 96* 93*  CO2 23 24 30   GLUCOSE 95 124* 127*  BUN 15 16 21   CALCIUM 9.2 9.4 9.9  CREATININE 1.17 1.27* 1.34*  GFRNONAA >60 59* 56*  GFRAA >60 >60 >60    LIVER FUNCTION TESTS: Recent Labs    02/03/18 0132 02/21/18 1337  BILITOT 0.5 0.4  AST 18 13*  ALT 18 10  ALKPHOS 59 65  PROT 7.8 7.5  ALBUMIN 4.0 4.6    TUMOR MARKERS: No results for input(s): AFPTM, CEA, CA199, CHROMGRNA in the last 8760 hours.  Assessment and Plan:  62 y.o. male with history of newly diagnosed gastric adenocarcinoma who presents today for Port-A-Cath placement for chemotherapy.Risks and benefits of image guided port-a-catheter placement was discussed with the patient/spouse including, but not limited to bleeding, infection, pneumothorax, or fibrin sheath development and need for additional procedures.  All of the patient's questions were answered, patient is agreeable to proceed. Consent signed and in  chart.  LABS PENDING    Thank you for this interesting consult.  I greatly enjoyed meeting Steven Ortega and look forward to participating in their care.  A copy of this report was sent to the requesting provider on this date.  Electronically Signed: D. Rowe Robert, PA-C 03/04/2018, 12:07 PM   I spent a total of 25 minutes    in face to face in clinical consultation, greater than 50% of which was counseling/coordinating care for Port-A-Cath placement

## 2018-03-04 NOTE — Discharge Instructions (Signed)
Do not use EMLA cream on skin glue under dressing. EMLA cream will dissolve skin glue. Wait until nurses from [facility] tell you it is okay to start using EMLA cream.      Implanted Port Insertion, Care After This sheet gives you information about how to care for yourself after your procedure. Your health care provider may also give you more specific instructions. If you have problems or questions, contact your health care provider. What can I expect after the procedure? After the procedure, it is common to have:  Discomfort at the port insertion site.  Bruising on the skin over the port. This should improve over 3-4 days. Follow these instructions at home: [facility] care  After your port is placed, you will get a manufacturer's information card. The card has information about your port. Keep this card with you at all times.  Take care of the port as told by your health care provider. Ask your health care provider if you or a family member can get training for taking care of the port at home. A home health care nurse may also take care of the port.  Make sure to remember what type of port you have. Incision care      Follow instructions from your health care provider about how to take care of your port insertion site. Make sure you: ? Wash your hands with soap and water before and after you change your bandage (dressing). If soap and water are not available, use hand sanitizer. ? Change your dressing as told by your health care provider. ? Leave stitches (sutures), skin glue, or adhesive strips in place. These skin closures may need to stay in place for 2 weeks or longer. If adhesive strip edges start to loosen and curl up, you may trim the loose edges. Do not remove adhesive strips completely unless your health care provider tells you to do that.  Check your port insertion site every day for signs of infection. Check for: ? Redness, swelling, or pain. ? Fluid or  blood. ? Warmth. ? Pus or a bad smell. Activity  Return to your normal activities as told by your health care provider. Ask your health care provider what activities are safe for you.  Do not lift anything that is heavier than 10 lb (4.5 kg), or the limit that you are told, until your health care provider says that it is safe. General instructions  Take over-the-counter and prescription medicines only as told by your health care provider.  Do not take baths, swim, or use a hot tub until your health care provider approves. Ask your health care provider if you may take showers. You may only be allowed to take sponge baths.  Do not drive for 24 hours if you were given a sedative during your procedure.  Wear a medical alert bracelet in case of an emergency. This will tell any health care providers that you have a port.  Keep all follow-up visits as told by your health care provider. This is important. Contact a health care provider if:  You cannot flush your port with saline as directed, or you cannot draw blood from the port.  You have a fever or chills.  You have redness, swelling, or pain around your port insertion site.  You have fluid or blood coming from your port insertion site.  Your port insertion site feels warm to the touch.  You have pus or a bad smell coming from the port insertion site.  Get help right away if:  You have chest pain or shortness of breath.  You have bleeding from your port that you cannot control. Summary  Take care of the port as told by your health care provider. Keep the manufacturer's information card with you at all times.  Change your dressing as told by your health care provider.  Contact a health care provider if you have a fever or chills or if you have redness, swelling, or pain around your port insertion site.  Keep all follow-up visits as told by your health care provider. This information is not intended to replace advice given to  you by your health care provider. Make sure you discuss any questions you have with your health care provider. Document Released: 12/10/2012 Document Revised: 09/17/2017 Document Reviewed: 09/17/2017 Elsevier Interactive Patient Education  2019 [place].    Moderate Conscious Sedation, Adult, Care After These instructions provide you with information about caring for yourself after your procedure. Your health care provider may also give you more specific instructions. Your treatment has been planned according to current medical practices, but problems sometimes occur. Call your health care provider if you have any problems or questions after your procedure. What can I expect after the procedure? After your procedure, it is common:  To feel sleepy for several hours.  To feel clumsy and have poor balance for several hours.  To have poor judgment for several hours.  To vomit if you eat too soon. Follow these instructions at home: For at least 24 hours after the procedure:   Do not: ? Participate in activities where you could fall or become injured. ? Drive. ? Use heavy machinery. ? Drink alcohol. ? Take sleeping pills or medicines that cause drowsiness. ? Make important decisions or sign legal documents. ? Take care of children on your own.  Rest. Eating and drinking  Follow the diet recommended by your health care provider.  If you vomit: ? Drink water, juice, or soup when you can drink without vomiting. ? Make sure you have little or no nausea before eating solid foods. General instructions  Have a responsible adult stay with you until you are awake and alert.  Take over-the-counter and prescription medicines only as told by your health care provider.  If you smoke, do not smoke without supervision.  Keep all follow-up visits as told by your health care provider. This is important. Contact a health care provider if:  You keep feeling nauseous or you keep  vomiting.  You feel light-headed.  You develop a rash.  You have a fever. Get help right away if:  You have trouble breathing. This information is not intended to replace advice given to you by your health care provider. Make sure you discuss any questions you have with your health care provider. Document Released: 12/10/2012 Document Revised: 07/25/2015 Document Reviewed: 06/11/2015 Elsevier Interactive Patient Education  2019 [employer].

## 2018-03-04 NOTE — Procedures (Signed)
  Procedure: R IJ Port placement   EBL:   minimal Complications:  none immediate  See full dictation in [facility] PACS.  D. Trasean Delima MD Main # [phone] Pager  [phone]    

## 2018-03-06 ENCOUNTER — Encounter: Payer: Self-pay | Admitting: *Deleted

## 2018-03-06 ENCOUNTER — Inpatient Hospital Stay: Payer: Medicare Other | Attending: Hematology & Oncology

## 2018-03-06 ENCOUNTER — Encounter: Payer: Self-pay | Admitting: Hematology & Oncology

## 2018-03-06 ENCOUNTER — Inpatient Hospital Stay: Payer: Medicare Other

## 2018-03-06 ENCOUNTER — Telehealth: Payer: Self-pay | Admitting: *Deleted

## 2018-03-06 VITALS — BP 145/83 | HR 86 | Temp 98.2°F | Resp 20

## 2018-03-06 DIAGNOSIS — D649 Anemia, unspecified: Secondary | ICD-10-CM | POA: Insufficient documentation

## 2018-03-06 DIAGNOSIS — C165 Malignant neoplasm of lesser curvature of stomach, unspecified: Secondary | ICD-10-CM | POA: Insufficient documentation

## 2018-03-06 DIAGNOSIS — Z5111 Encounter for antineoplastic chemotherapy: Secondary | ICD-10-CM | POA: Diagnosis not present

## 2018-03-06 LAB — CMP (CANCER CENTER ONLY)
ALT: 404 U/L (ref 0–44)
AST: 283 U/L — AB (ref 15–41)
Albumin: 3.6 g/dL (ref 3.5–5.0)
Alkaline Phosphatase: 306 U/L — ABNORMAL HIGH (ref 38–126)
Anion gap: 9 (ref 5–15)
BUN: 10 mg/dL (ref 8–23)
CO2: 29 mmol/L (ref 22–32)
Calcium: 9 mg/dL (ref 8.9–10.3)
Chloride: 98 mmol/L (ref 98–111)
Creatinine: 1.07 mg/dL (ref 0.61–1.24)
GFR, Est AFR Am: >60 mL/min (ref >60)
GFR, Estimated: >60 mL/min (ref >60)
Glucose, Bld: 115 mg/dL — ABNORMAL HIGH (ref 70–99)
Potassium: 3.1 mmol/L — ABNORMAL LOW (ref 3.5–5.1)
Sodium: 136 mmol/L (ref 135–145)
Total Bilirubin: 0.9 mg/dL (ref 0.3–1.2)
Total Protein: 6.7 g/dL (ref 6.5–8.1)

## 2018-03-06 LAB — CBC WITH DIFFERENTIAL (CANCER CENTER ONLY)
Abs Immature Granulocytes: 0.04 10*3/uL (ref 0.00–0.07)
Basophils Absolute: 0 10*3/uL (ref 0.0–0.1)
Basophils Relative: 1 %
Eosinophils Absolute: 0.2 10*3/uL (ref 0.0–0.5)
Eosinophils Relative: 4 %
HCT: 33 % — ABNORMAL LOW (ref 39.0–52.0)
Hemoglobin: 10.4 g/dL — ABNORMAL LOW (ref 13.0–17.0)
IMMATURE GRANULOCYTES: 1 %
Lymphocytes Relative: 11 %
Lymphs Abs: 0.5 10*3/uL — ABNORMAL LOW (ref 0.7–4.0)
MCH: 27.1 pg (ref 26.0–34.0)
MCHC: 31.5 g/dL (ref 30.0–36.0)
MCV: 85.9 fL (ref 80.0–100.0)
Monocytes Absolute: 0.6 10*3/uL (ref 0.1–1.0)
Monocytes Relative: 12 %
NEUTROS ABS: 3.4 10*3/uL (ref 1.7–7.7)
NEUTROS PCT: 71 %
PLATELETS: 303 10*3/uL (ref 150–400)
RBC: 3.84 MIL/uL — ABNORMAL LOW (ref 4.22–5.81)
RDW: 12.7 % (ref 11.5–15.5)
WBC Count: 4.8 10*3/uL (ref 4.0–10.5)
nRBC: 0 % (ref 0.0–0.2)

## 2018-03-06 MED ORDER — SODIUM CHLORIDE 0.9 % IV SOLN
INTRAVENOUS | Status: DC
  Administered 2018-03-06: 10:00:00 via INTRAVENOUS
  Filled 2018-03-06: qty 250

## 2018-03-06 MED ORDER — PALONOSETRON HCL INJECTION 0.25 MG/5ML
INTRAVENOUS | Status: AC
  Filled 2018-03-06: qty 5

## 2018-03-06 MED ORDER — OXALIPLATIN CHEMO INJECTION 100 MG/20ML
85.0000 mg/m2 | Freq: Once | INTRAVENOUS | Status: AC
  Administered 2018-03-06: 185 mg via INTRAVENOUS
  Filled 2018-03-06: qty 37

## 2018-03-06 MED ORDER — SODIUM CHLORIDE 0.9 % IV SOLN
1800.0000 mg/m2 | INTRAVENOUS | Status: DC
  Administered 2018-03-06: 3900 mg via INTRAVENOUS
  Filled 2018-03-06: qty 78

## 2018-03-06 MED ORDER — DEXTROSE 5 % IV SOLN
Freq: Once | INTRAVENOUS | Status: AC
  Administered 2018-03-06: 11:00:00 via INTRAVENOUS
  Filled 2018-03-06: qty 250

## 2018-03-06 MED ORDER — DEXTROSE 5 % IV SOLN
INTRAVENOUS | Status: DC
  Administered 2018-03-06: 11:00:00 via INTRAVENOUS
  Filled 2018-03-06: qty 250

## 2018-03-06 MED ORDER — LEUCOVORIN CALCIUM INJECTION 350 MG
200.0000 mg/m2 | Freq: Once | INTRAVENOUS | Status: AC
  Administered 2018-03-06: 432 mg via INTRAVENOUS
  Filled 2018-03-06: qty 21.6

## 2018-03-06 MED ORDER — DEXAMETHASONE SODIUM PHOSPHATE 10 MG/ML IJ SOLN
INTRAMUSCULAR | Status: AC
  Filled 2018-03-06: qty 1

## 2018-03-06 MED ORDER — PALONOSETRON HCL INJECTION 0.25 MG/5ML
0.2500 mg | Freq: Once | INTRAVENOUS | Status: AC
  Administered 2018-03-06: 0.25 mg via INTRAVENOUS

## 2018-03-06 MED ORDER — DEXAMETHASONE SODIUM PHOSPHATE 10 MG/ML IJ SOLN
10.0000 mg | Freq: Once | INTRAMUSCULAR | Status: AC
  Administered 2018-03-06: 10 mg via INTRAVENOUS

## 2018-03-06 NOTE — Patient Instructions (Signed)
[place] Discharge Instructions for Patients Receiving Chemotherapy  Today you received the following chemotherapy agents:  Ox[place], Leucovorin and 5FU  To help prevent nausea and vomiting after your treatment, we encourage you to take your nausea medication as ordered per MD.    If you develop nausea and vomiting that is not controlled by your nausea medication, call the clinic.   BELOW ARE SYMPTOMS THAT SHOULD BE REPORTED IMMEDIATELY:  *FEVER GREATER THAN 100.5 F  *CHILLS WITH OR WITHOUT FEVER  NAUSEA AND VOMITING THAT IS NOT CONTROLLED WITH YOUR NAUSEA MEDICATION  *UNUSUAL SHORTNESS OF BREATH  *UNUSUAL BRUISING OR BLEEDING  TENDERNESS IN MOUTH AND THROAT WITH OR WITHOUT PRESENCE OF ULCERS  *URINARY PROBLEMS  *BOWEL PROBLEMS  UNUSUAL RASH Items with * indicate a potential emergency and should be followed up as soon as possible.  Feel free to call the clinic should you have any questions or concerns. The clinic phone number is [phone].  Please show the [facility] at check-in to the Emergency Department and triage nurse.

## 2018-03-06 NOTE — Progress Notes (Signed)
Patient here to start treatment. His liver enzymes were unexpectedly elevated so his regimen had to be altered. Patient and wife are understandably upset, but overall, they are glad that patient was able to start treatment in some form.  Reviewed the education the RN provided during his infusion. He has the prn medications he needs at home. He knows how to call if questions or concerns arise.  He will be back tomorrow to have his pump dc'd. No further questions or needs at this time.

## 2018-03-06 NOTE — Telephone Encounter (Signed)
Critical Value AST 283 ALT 404 Dr Marin Olp notified. No orders at this time.

## 2018-03-06 NOTE — Progress Notes (Signed)
Docetaxel being held today due to increased LFTs. Decrease fluorouracil by 25% today per. Dr. Marin Olp.

## 2018-03-06 NOTE — Progress Notes (Signed)
Dr Marin Olp spoke with pt and pt.'s wife regarding elevated liver function.  Pt and pt.'s wife instructed per Dr. Marin Olp to stop taking Reglan and Acyclovir.  Pt and pt.'s wife both verbalize an understanding to stop Reglan and Acyclovir.

## 2018-03-06 NOTE — Progress Notes (Signed)
Dr Marin Olp notified of CBC and CMET results from today.  OK to treat wtih  AST-283, ALT-404 and Alk Phos-306 per order of Dr. Marin Olp.  Taxotere will be held and 5FU dose will be decreased with today's treatment per order of Dr. Marin Olp.

## 2018-03-07 ENCOUNTER — Inpatient Hospital Stay: Payer: Medicare Other

## 2018-03-07 ENCOUNTER — Encounter: Payer: Self-pay | Admitting: *Deleted

## 2018-03-07 VITALS — BP 131/77 | HR 94 | Temp 98.2°F | Resp 18

## 2018-03-07 DIAGNOSIS — Z5111 Encounter for antineoplastic chemotherapy: Secondary | ICD-10-CM | POA: Diagnosis not present

## 2018-03-07 DIAGNOSIS — C165 Malignant neoplasm of lesser curvature of stomach, unspecified: Secondary | ICD-10-CM

## 2018-03-07 MED ORDER — HEPARIN SOD (PORK) LOCK FLUSH 100 UNIT/ML IV SOLN
500.0000 [IU] | Freq: Once | INTRAVENOUS | Status: AC | PRN
  Administered 2018-03-07: 500 [IU]
  Filled 2018-03-07: qty 5

## 2018-03-07 MED ORDER — SODIUM CHLORIDE 0.9% FLUSH
10.0000 mL | INTRAVENOUS | Status: DC | PRN
  Administered 2018-03-07: 10 mL
  Filled 2018-03-07: qty 10

## 2018-03-10 ENCOUNTER — Telehealth (INDEPENDENT_AMBULATORY_CARE_PROVIDER_SITE_OTHER): Payer: Self-pay | Admitting: Orthopedic Surgery

## 2018-03-10 ENCOUNTER — Other Ambulatory Visit: Payer: Self-pay | Admitting: *Deleted

## 2018-03-10 MED ORDER — HYDROMORPHONE HCL 2 MG PO TABS: 2.0000 mg | ORAL_TABLET | ORAL | 0 refills | Status: AC | PRN

## 2018-03-10 NOTE — Telephone Encounter (Signed)
Patient's wife Jobe Gibbon) called advised patient has started Chemo treatments and will not be able to come to his appointment. Jobe Gibbon asked if patient can be released so that the [place] will pay the bill for [employer].  The number to contact Jobe Gibbon is [phone]

## 2018-03-10 NOTE — Telephone Encounter (Signed)
Is there something that we need to do or send to the [place] stating he is released?

## 2018-03-10 NOTE — Telephone Encounter (Signed)
Please see below.

## 2018-03-10 NOTE — Telephone Encounter (Signed)
Ok to be released

## 2018-03-12 ENCOUNTER — Ambulatory Visit (INDEPENDENT_AMBULATORY_CARE_PROVIDER_SITE_OTHER): Payer: PRIVATE HEALTH INSURANCE | Admitting: Orthopedic Surgery

## 2018-03-13 ENCOUNTER — Encounter: Payer: Self-pay | Admitting: *Deleted

## 2018-03-13 ENCOUNTER — Inpatient Hospital Stay (HOSPITAL_BASED_OUTPATIENT_CLINIC_OR_DEPARTMENT_OTHER): Payer: Medicare Other | Admitting: Hematology & Oncology

## 2018-03-13 ENCOUNTER — Inpatient Hospital Stay (HOSPITAL_BASED_OUTPATIENT_CLINIC_OR_DEPARTMENT_OTHER)
Admission: EM | Admit: 2018-03-13 | Discharge: 2018-03-24 | DRG: 444 | Disposition: A | Discharge status: Other Acute Inpatient Hospital | Payer: Medicare Other | Attending: Internal Medicine | Admitting: Internal Medicine

## 2018-03-13 ENCOUNTER — Emergency Department (HOSPITAL_BASED_OUTPATIENT_CLINIC_OR_DEPARTMENT_OTHER): Payer: Medicare Other

## 2018-03-13 ENCOUNTER — Encounter (HOSPITAL_BASED_OUTPATIENT_CLINIC_OR_DEPARTMENT_OTHER): Payer: Self-pay | Admitting: *Deleted

## 2018-03-13 ENCOUNTER — Inpatient Hospital Stay: Payer: Medicare Other

## 2018-03-13 ENCOUNTER — Other Ambulatory Visit: Payer: Self-pay

## 2018-03-13 ENCOUNTER — Other Ambulatory Visit (HOSPITAL_COMMUNITY): Payer: Medicare Other

## 2018-03-13 VITALS — BP 126/76 | HR 104 | Temp 98.7°F | Resp 18

## 2018-03-13 DIAGNOSIS — K5790 Diverticulosis of intestine, part unspecified, without perforation or abscess without bleeding: Secondary | ICD-10-CM | POA: Diagnosis present

## 2018-03-13 DIAGNOSIS — R945 Abnormal results of liver function studies: Secondary | ICD-10-CM | POA: Diagnosis not present

## 2018-03-13 DIAGNOSIS — Z79890 Hormone replacement therapy (postmenopausal): Secondary | ICD-10-CM

## 2018-03-13 DIAGNOSIS — H9190 Unspecified hearing loss, unspecified ear: Secondary | ICD-10-CM | POA: Diagnosis present

## 2018-03-13 DIAGNOSIS — Z87442 Personal history of urinary calculi: Secondary | ICD-10-CM

## 2018-03-13 DIAGNOSIS — Z95828 Presence of other vascular implants and grafts: Secondary | ICD-10-CM

## 2018-03-13 DIAGNOSIS — K831 Obstruction of bile duct: Secondary | ICD-10-CM | POA: Diagnosis present

## 2018-03-13 DIAGNOSIS — D649 Anemia, unspecified: Secondary | ICD-10-CM | POA: Diagnosis present

## 2018-03-13 DIAGNOSIS — R52 Pain, unspecified: Secondary | ICD-10-CM

## 2018-03-13 DIAGNOSIS — E785 Hyperlipidemia, unspecified: Secondary | ICD-10-CM | POA: Diagnosis present

## 2018-03-13 DIAGNOSIS — E039 Hypothyroidism, unspecified: Secondary | ICD-10-CM | POA: Diagnosis present

## 2018-03-13 DIAGNOSIS — C169 Malignant neoplasm of stomach, unspecified: Secondary | ICD-10-CM | POA: Diagnosis not present

## 2018-03-13 DIAGNOSIS — B9620 Unspecified Escherichia coli [E. coli] as the cause of diseases classified elsewhere: Secondary | ICD-10-CM | POA: Diagnosis not present

## 2018-03-13 DIAGNOSIS — R109 Unspecified abdominal pain: Secondary | ICD-10-CM

## 2018-03-13 DIAGNOSIS — R079 Chest pain, unspecified: Secondary | ICD-10-CM

## 2018-03-13 DIAGNOSIS — D500 Iron deficiency anemia secondary to blood loss (chronic): Secondary | ICD-10-CM | POA: Diagnosis present

## 2018-03-13 DIAGNOSIS — K5900 Constipation, unspecified: Secondary | ICD-10-CM | POA: Diagnosis not present

## 2018-03-13 DIAGNOSIS — K311 Adult hypertrophic pyloric stenosis: Secondary | ICD-10-CM | POA: Diagnosis present

## 2018-03-13 DIAGNOSIS — R7989 Other specified abnormal findings of blood chemistry: Secondary | ICD-10-CM | POA: Diagnosis present

## 2018-03-13 DIAGNOSIS — I10 Essential (primary) hypertension: Secondary | ICD-10-CM | POA: Diagnosis not present

## 2018-03-13 DIAGNOSIS — K219 Gastro-esophageal reflux disease without esophagitis: Secondary | ICD-10-CM | POA: Diagnosis present

## 2018-03-13 DIAGNOSIS — Z86010 Personal history of colonic polyps: Secondary | ICD-10-CM

## 2018-03-13 DIAGNOSIS — Z981 Arthrodesis status: Secondary | ICD-10-CM

## 2018-03-13 DIAGNOSIS — I700 Atherosclerosis of aorta: Secondary | ICD-10-CM | POA: Diagnosis present

## 2018-03-13 DIAGNOSIS — M1990 Unspecified osteoarthritis, unspecified site: Secondary | ICD-10-CM | POA: Diagnosis present

## 2018-03-13 DIAGNOSIS — E8809 Other disorders of plasma-protein metabolism, not elsewhere classified: Secondary | ICD-10-CM | POA: Diagnosis not present

## 2018-03-13 DIAGNOSIS — E43 Unspecified severe protein-calorie malnutrition: Secondary | ICD-10-CM | POA: Diagnosis present

## 2018-03-13 DIAGNOSIS — Z87891 Personal history of nicotine dependence: Secondary | ICD-10-CM | POA: Diagnosis not present

## 2018-03-13 DIAGNOSIS — R7881 Bacteremia: Secondary | ICD-10-CM | POA: Diagnosis present

## 2018-03-13 DIAGNOSIS — R188 Other ascites: Secondary | ICD-10-CM | POA: Diagnosis present

## 2018-03-13 DIAGNOSIS — R1011 Right upper quadrant pain: Secondary | ICD-10-CM | POA: Diagnosis present

## 2018-03-13 DIAGNOSIS — C165 Malignant neoplasm of lesser curvature of stomach, unspecified: Secondary | ICD-10-CM

## 2018-03-13 DIAGNOSIS — Z8249 Family history of ischemic heart disease and other diseases of the circulatory system: Secondary | ICD-10-CM

## 2018-03-13 DIAGNOSIS — C801 Malignant (primary) neoplasm, unspecified: Secondary | ICD-10-CM | POA: Diagnosis not present

## 2018-03-13 DIAGNOSIS — Z801 Family history of malignant neoplasm of trachea, bronchus and lung: Secondary | ICD-10-CM

## 2018-03-13 DIAGNOSIS — A4151 Sepsis due to Escherichia coli [E. coli]: Secondary | ICD-10-CM | POA: Diagnosis not present

## 2018-03-13 DIAGNOSIS — E876 Hypokalemia: Secondary | ICD-10-CM | POA: Diagnosis present

## 2018-03-13 DIAGNOSIS — R748 Abnormal levels of other serum enzymes: Secondary | ICD-10-CM | POA: Diagnosis not present

## 2018-03-13 DIAGNOSIS — Z6826 Body mass index (BMI) 26.0-26.9, adult: Secondary | ICD-10-CM

## 2018-03-13 DIAGNOSIS — N400 Enlarged prostate without lower urinary tract symptoms: Secondary | ICD-10-CM | POA: Diagnosis present

## 2018-03-13 DIAGNOSIS — F40240 Claustrophobia: Secondary | ICD-10-CM | POA: Diagnosis present

## 2018-03-13 LAB — CMP (CANCER CENTER ONLY)
ALBUMIN: 3.4 g/dL — AB (ref 3.5–5.0)
ALT: 173 U/L — ABNORMAL HIGH (ref 0–44)
AST: 76 U/L — AB (ref 15–41)
Alkaline Phosphatase: 326 U/L — ABNORMAL HIGH (ref 38–126)
Anion gap: 10 (ref 5–15)
BUN: 9 mg/dL (ref 8–23)
CO2: 24 mmol/L (ref 22–32)
Calcium: 8.5 mg/dL — ABNORMAL LOW (ref 8.9–10.3)
Chloride: 99 mmol/L (ref 98–111)
Creatinine: 0.76 mg/dL (ref 0.61–1.24)
GFR, Est AFR Am: >60 mL/min (ref >60)
GFR, Estimated: >60 mL/min (ref >60)
Glucose, Bld: 113 mg/dL — ABNORMAL HIGH (ref 70–99)
Potassium: 3.2 mmol/L — ABNORMAL LOW (ref 3.5–5.1)
Sodium: 133 mmol/L — ABNORMAL LOW (ref 135–145)
Total Bilirubin: 7.1 mg/dL (ref 0.3–1.2)
Total Protein: 6.5 g/dL (ref 6.5–8.1)

## 2018-03-13 LAB — CBC WITH DIFFERENTIAL (CANCER CENTER ONLY)
Abs Immature Granulocytes: 0.11 10*3/uL — ABNORMAL HIGH (ref 0.00–0.07)
BASOS ABS: 0 10*3/uL (ref 0.0–0.1)
Basophils Relative: 1 %
Eosinophils Absolute: 0.1 10*3/uL (ref 0.0–0.5)
Eosinophils Relative: 2 %
HCT: 29.1 % — ABNORMAL LOW (ref 39.0–52.0)
Hemoglobin: 9.5 g/dL — ABNORMAL LOW (ref 13.0–17.0)
Immature Granulocytes: 2 %
Lymphocytes Relative: 12 %
Lymphs Abs: 0.6 10*3/uL — ABNORMAL LOW (ref 0.7–4.0)
MCH: 27.9 pg (ref 26.0–34.0)
MCHC: 32.6 g/dL (ref 30.0–36.0)
MCV: 85.6 fL (ref 80.0–100.0)
Monocytes Absolute: 0.6 10*3/uL (ref 0.1–1.0)
Monocytes Relative: 12 %
Neutro Abs: 3.7 10*3/uL (ref 1.7–7.7)
Neutrophils Relative %: 71 %
Platelet Count: 366 10*3/uL (ref 150–400)
RBC: 3.4 MIL/uL — AB (ref 4.22–5.81)
RDW: 14.1 % (ref 11.5–15.5)
WBC: 5.1 10*3/uL (ref 4.0–10.5)
nRBC: 0 % (ref 0.0–0.2)

## 2018-03-13 LAB — BILIRUBIN, DIRECT: Bilirubin, Direct: 5.2 mg/dL — ABNORMAL HIGH (ref 0.0–0.2)

## 2018-03-13 LAB — MAGNESIUM: Magnesium: 2.2 mg/dL (ref 1.7–2.4)

## 2018-03-13 MED ORDER — ONDANSETRON HCL 4 MG/2ML IJ SOLN: 4.0000 mg | Freq: Four times a day (QID) | INTRAMUSCULAR | Status: DC | PRN

## 2018-03-13 MED ORDER — HEPARIN SOD (PORK) LOCK FLUSH 100 UNIT/ML IV SOLN
500.0000 [IU] | Freq: Once | INTRAVENOUS | Status: AC
  Administered 2018-03-13: 500 [IU] via INTRAVENOUS
  Filled 2018-03-13: qty 5

## 2018-03-13 MED ORDER — ONDANSETRON HCL 4 MG/2ML IJ SOLN
4.0000 mg | Freq: Once | INTRAMUSCULAR | Status: AC
  Administered 2018-03-13: 4 mg via INTRAVENOUS
  Filled 2018-03-13: qty 2

## 2018-03-13 MED ORDER — POTASSIUM CHLORIDE 10 MEQ/100ML IV SOLN
10.0000 meq | INTRAVENOUS | Status: AC
  Administered 2018-03-13 – 2018-03-14 (×4): 10 meq via INTRAVENOUS
  Filled 2018-03-13 (×3): qty 100

## 2018-03-13 MED ORDER — HYDROMORPHONE HCL 1 MG/ML IJ SOLN
1.0000 mg | Freq: Once | INTRAMUSCULAR | Status: AC
  Administered 2018-03-13: 1 mg via INTRAVENOUS
  Filled 2018-03-13: qty 1

## 2018-03-13 MED ORDER — SODIUM CHLORIDE 0.9 % IV SOLN
INTRAVENOUS | Status: DC
  Administered 2018-03-13 – 2018-03-23 (×16): via INTRAVENOUS

## 2018-03-13 MED ORDER — LORAZEPAM 2 MG/ML IJ SOLN
0.5000 mg | Freq: Four times a day (QID) | INTRAMUSCULAR | Status: DC | PRN
  Administered 2018-03-14 – 2018-03-23 (×13): 0.5 mg via INTRAVENOUS
  Filled 2018-03-13 (×13): qty 1

## 2018-03-13 MED ORDER — SODIUM CHLORIDE 0.9% FLUSH
10.0000 mL | INTRAVENOUS | Status: DC | PRN
  Administered 2018-03-13: 10 mL via INTRAVENOUS
  Filled 2018-03-13: qty 10

## 2018-03-13 MED ORDER — FENTANYL CITRATE (PF) 100 MCG/2ML IJ SOLN
25.0000 ug | INTRAMUSCULAR | Status: DC | PRN
  Administered 2018-03-13: 50 ug via INTRAVENOUS
  Filled 2018-03-13: qty 2

## 2018-03-13 MED ORDER — HYDROMORPHONE HCL 2 MG PO TABS
2.0000 mg | ORAL_TABLET | Freq: Once | ORAL | Status: DC
  Filled 2018-03-13: qty 1

## 2018-03-13 MED ORDER — ONDANSETRON HCL 4 MG/2ML IJ SOLN
4.0000 mg | Freq: Four times a day (QID) | INTRAMUSCULAR | Status: DC | PRN
  Administered 2018-03-15 – 2018-03-24 (×12): 4 mg via INTRAVENOUS
  Filled 2018-03-13 (×14): qty 2

## 2018-03-13 MED ORDER — SODIUM CHLORIDE 0.9 % IV SOLN
8.0000 mg | Freq: Four times a day (QID) | INTRAVENOUS | Status: DC | PRN
  Filled 2018-03-13: qty 4

## 2018-03-13 NOTE — ED Notes (Signed)
Asked [facility] to repage hospitalist to enter bed request

## 2018-03-13 NOTE — Progress Notes (Signed)
I saw Steven Ortega in the chemotherapy clinic.  He was in to have blood work done from last week.  He has tolerated his first cycle of chemotherapy - FLOT -very well.  I noticed that he was quite jaundiced.  When we saw him last week, he had markedly elevated SGPT and SGOT levels.  His bilirubin was normal.  We dose adjusted his chemotherapy.  Today, his SGPT and SGOT are much better.  However, his bilirubin is 7.1.  I am not sure what is going on.  However, I do believe that he is going to have to be admitted for emergent GI evaluation.  I will know if they can do an ERCP on him to see what is going on.  He is really not having much abdominal pain.  He has occasional pain on the right side and in his back.  He is eating a little bit better.  He has had no fever.  His skin does not itch.  He told me that he has been told before that he has a gallstone.  I know that with his recent staging studies for his gastric cancer, that there is no obvious gallbladder issues noted or gallstone seen.  I am very encouraged by the marked decrease of the SGPT/SGOT.  As such I would not think that he has hepatotoxicity from chemotherapy.  Again, I dose reduced his treatment.  His CBC looks okay with his white cell count 5.1.  He is still a little anemic.  I spoke to Mr. Nohr and his wife.  I explained the problem that we have.  I explained that we have to get him into the hospital.  I spoke to the emergency room physician.  They will take him and get him admitted.  I will find him when he gets admitted.  Lattie Haw, MD

## 2018-03-13 NOTE — ED Triage Notes (Signed)
He was sent from Dr Elmer Bales office after his liver fx test were elevated. Skin in yellow.

## 2018-03-13 NOTE — ED Notes (Signed)
Wife and husband states that they were told to come to this department prior to being transferred to [facility] for possible removal of gall bladder.

## 2018-03-13 NOTE — Patient Instructions (Signed)
Implanted Port Insertion, Care After  This sheet gives you information about how to care for yourself after your procedure. Your health care provider may also give you more specific instructions. If you have problems or questions, contact your health care provider.  What can I expect after the procedure?  After the procedure, it is common to have:  · Discomfort at the port insertion site.  · Bruising on the skin over the port. This should improve over 3-4 days.  Follow these instructions at home:  Port care  · After your port is placed, you will get a manufacturer's information card. The card has information about your port. Keep this card with you at all times.  · Take care of the port as told by your health care provider. Ask your health care provider if you or a family member can get training for taking care of the port at home. A home health care nurse may also take care of the port.  · Make sure to remember what type of port you have.  Incision care         · Follow instructions from your health care provider about how to take care of your port insertion site. Make sure you:  ? Wash your hands with soap and water before and after you change your bandage (dressing). If soap and water are not available, use hand sanitizer.  ? Change your dressing as told by your health care provider.  ? Leave stitches (sutures), skin glue, or adhesive strips in place. These skin closures may need to stay in place for 2 weeks or longer. If adhesive strip edges start to loosen and curl up, you may trim the loose edges. Do not remove adhesive strips completely unless your health care provider tells you to do that.  · Check your port insertion site every day for signs of infection. Check for:  ? Redness, swelling, or pain.  ? Fluid or blood.  ? Warmth.  ? Pus or a bad smell.  Activity  · Return to your normal activities as told by your health care provider. Ask your health care provider what activities are safe for you.  · Do not  lift anything that is heavier than 10 lb (4.5 kg), or the limit that you are told, until your health care provider says that it is safe.  General instructions  · Take over-the-counter and prescription medicines only as told by your health care provider.  · Do not take baths, swim, or use a hot tub until your health care provider approves. Ask your health care provider if you may take showers. You may only be allowed to take sponge baths.  · Do not drive for 24 hours if you were given a sedative during your procedure.  · Wear a medical alert bracelet in case of an emergency. This will tell any health care providers that you have a port.  · Keep all follow-up visits as told by your health care provider. This is important.  Contact a health care provider if:  · You cannot flush your port with saline as directed, or you cannot draw blood from the port.  · You have a fever or chills.  · You have redness, swelling, or pain around your port insertion site.  · You have fluid or blood coming from your port insertion site.  · Your port insertion site feels warm to the touch.  · You have pus or a bad smell coming from the port   insertion site.  Get help right away if:  · You have chest pain or shortness of breath.  · You have bleeding from your port that you cannot control.  Summary  · Take care of the port as told by your health care provider. Keep the manufacturer's information card with you at all times.  · Change your dressing as told by your health care provider.  · Contact a health care provider if you have a fever or chills or if you have redness, swelling, or pain around your port insertion site.  · Keep all follow-up visits as told by your health care provider.  This information is not intended to replace advice given to you by your health care provider. Make sure you discuss any questions you have with your health care provider.  Document Released: 12/10/2012 Document Revised: 09/17/2017 Document Reviewed:  09/17/2017  Elsevier Interactive Patient Education © 2019 [employer]

## 2018-03-13 NOTE — ED Provider Notes (Signed)
[facility] EMERGENCY DEPARTMENT Provider Note   CSN: [id number] Arrival date & time: 03/13/18  1614     History   Chief Complaint Chief Complaint  Patient presents with  . Abnormal Lab    HPI Steven Ortega is a 63 y.o. male.  HPI Patient sent down by Dr. Marin Olp.  Just started neoadjuvant chemotherapy for a stage II stomach cancer.  LFTs have been mildly elevated.  Had no liver disease or biliary obstruction on previous work-up but had had right upper quadrant pain.  Now bilirubin has gone up to 7.  Patient is jaundiced.  Has continued pain.  Transferred down for further work-up.  Blood work done at oncology.  LFTs have improved.  Per oncology likely require admission and ER or MRCP.  No fevers. Past Medical History:  Diagnosis Date  . Adenomatous colon polyp   . Anal fissure   . Anemia   . Anxiety   . Arthritis   . Cancer of lesser curvature of stomach ([place]) 02/21/2018  . Constipation   . Depression   . Diverticulosis   . GERD (gastroesophageal reflux disease)   . Goals of care, counseling/discussion 02/21/2018  . Hearing aid worn    b/l  . HOH (hard of hearing)   . Hyperlipidemia   . Hypertension   . Hypothyroidism   . Iron deficiency anemia due to chronic blood loss 02/28/2018  . Kidney stones   . Kidney stones   . Pyloric erosion   . UNSPECIFIED ANEMIA 03/11/2008   Qualifier: Diagnosis of  By: Theda Sers CMA, Anderson Malta    . Wears dentures   . Wears glasses     Patient Active Problem List   Diagnosis Date Noted  . Iron deficiency anemia due to chronic blood loss 02/28/2018  . Cancer of lesser curvature of stomach ([place]) 02/21/2018  . Goals of care, counseling/discussion 02/21/2018  . Diverticulitis of colon (without mention of hemorrhage)(562.11) 10/13/2013  . GASTRITIS, CHRONIC 06/29/2008  . COLONIC POLYPS 03/11/2008  . HYPERLIPIDEMIA 03/11/2008  . ANXIETY 03/11/2008  . DEPRESSION 03/11/2008  . HYPERTENSION 03/11/2008  . DIVERTICULOSIS, COLON  03/11/2008  . ANAL FISSURE 03/11/2008  . CHRONIC KIDNEY DISEASE UNSPECIFIED 03/11/2008  . NEPHROLITHIASIS 03/11/2008  . CHEST PAIN UNSPECIFIED 03/11/2008  . ARTHRITIS, HX OF 03/11/2008    Past Surgical History:  Procedure Laterality Date  . ANAL FISSURE REPAIR  2010  . APPENDECTOMY  1984  . COLONOSCOPY W/ BIOPSIES AND POLYPECTOMY    . HERNIA REPAIR    . IR IMAGING GUIDED PORT INSERTION  03/04/2018  . LUMBAR FUSION  2013  . SHOULDER ARTHROSCOPY WITH SUBACROMIAL DECOMPRESSION, ROTATOR CUFF REPAIR AND BICEP TENDON REPAIR Left 12/09/2017   Procedure: LEFT SHOULDER ARTHROSCOPY SUBACROMIAL DECOMPRESSION, ROTATOR CUFF TEAR REPAIR;  Surgeon: Meredith Pel, MD;  Location: [facility];  Service: Orthopedics;  Laterality: Left;  . THUMB FUSION Left 2008  . THUMB FUSION Right 2008   x 2  . TRIGGER FINGER RELEASE Left 2008   2nd finger        Home Medications    Prior to Admission medications   Medication Sig Start Date End Date Taking? Authorizing Provider  acyclovir (ZOVIRAX) 800 MG tablet Take 400 mg by mouth daily as needed (fever blisters).     [provider]  ALPRAZolam Duanne Moron) 1 MG tablet Take 1 mg by mouth at bedtime.     [provider]  buPROPion (ZYBAN) 150 MG 12 hr tablet Take 150 mg by mouth 2 (  two) times daily.    [provider]  Cholecalciferol 2000 UNITS CAPS Take 2,000 Units by mouth daily.     [provider]  citalopram (CELEXA) 40 MG tablet Take 40 mg by mouth daily.     [provider]  cyclobenzaprine (FLEXERIL) 10 MG tablet Take 10 mg by mouth at bedtime.    [provider]  docusate sodium (STOOL SOFTENER) 100 MG capsule Take 100 mg by mouth daily.     [provider]  HYDROmorphone (DILAUDID) 2 MG tablet Take 1 tablet (2 mg total) by mouth every 4 (four) hours as needed for severe pain. 03/10/18   Volanda Napoleon, MD  levothyroxine (SYNTHROID, LEVOTHROID) 25 MCG tablet Take 25 mcg by mouth daily before  breakfast.    [provider]  lidocaine-prilocaine (EMLA) cream Apply to affected area once 02/28/18   Ennever, Rudell Cobb, MD  lipase/protease/amylase (CREON) 36000 UNITS CPEP capsule Take 2 capsules (72,000 Units total) by mouth 3 (three) times daily before meals. 02/28/18   Volanda Napoleon, MD  metoCLOPramide (REGLAN) 10 MG tablet Take 2 tablets (20 mg total) by mouth 4 (four) times daily -  before meals and at bedtime. 02/28/18   Volanda Napoleon, MD  ondansetron (ZOFRAN) 8 MG tablet Take 1 tablet (8 mg total) by mouth 2 (two) times daily as needed for refractory nausea / vomiting. Start on day 3 after chemotherapy. 02/28/18   Volanda Napoleon, MD  pantoprazole (PROTONIX) 40 MG tablet Take 1 tablet (40 mg total) by mouth 2 (two) times daily before a meal. 02/06/18   Esterwood, Amy S, PA-C  polyethylene glycol (MIRALAX / GLYCOLAX) packet Take 17 g by mouth daily.    [provider]  prochlorperazine (COMPAZINE) 10 MG tablet Take 1 tablet (10 mg total) by mouth every 6 (six) hours as needed (Nausea or vomiting). 02/28/18   Volanda Napoleon, MD  tamsulosin (FLOMAX) 0.4 MG CAPS capsule Take 1 capsule (0.4 mg total) by mouth daily. 09/18/15   Okey Regal, PA-C    Family History Family History  Problem Relation Age of Onset  . Heart attack Brother        x 2  . Heart attack Father   . Lung cancer Father   . Colon cancer Neg Hx     Social History Social History   Tobacco Use  . Smoking status: Former Smoker    Types: Cigarettes, Pipe    Last attempt to quit: 03/05/1998    Years since quitting: 20.0  . Smokeless tobacco: Former Systems developer    Types: Chew    Quit date: 03/05/1993  Substance Use Topics  . Alcohol use: Not Currently  . Drug use: No     Allergies   Patient has no known allergies.   Review of Systems Review of Systems  Constitutional: Negative for chills and fever.  Respiratory: Negative for shortness of breath.   Cardiovascular: Negative for chest pain.    Gastrointestinal: Positive for abdominal pain. Negative for nausea and vomiting.  Skin: Positive for color change. Negative for rash.  Neurological: Negative for weakness.     Physical Exam Updated Vital Signs BP 122/85 (BP Location: Left Arm)   Pulse 95   Temp 98.7 F (37.1 C) (Oral)   Resp 18   Ht 6' (1.829 m)   Wt 90.7 kg   SpO2 100%   BMI 27.12 kg/m   Physical Exam Abdominal:     Tenderness: There is abdominal tenderness.  Comments: Epigastric to right upper quadrant tenderness with some guarding.  Neurological:     Mental Status: He is alert.      ED Treatments / Results  Labs (all labs ordered are listed, but only abnormal results are displayed) Labs Reviewed  BILIRUBIN, DIRECT - Abnormal; Notable for the following components:      Result Value   Bilirubin, Direct 5.2 (*)    All other components within normal limits    EKG None  Radiology Dg Abd 2 Views  Result Date: 03/13/2018 CLINICAL DATA:  Right upper quadrant pain for 6 weeks EXAM: ABDOMEN - 2 VIEW COMPARISON:  None. FINDINGS: There is no free intraperitoneal gas. Nonspecific air-fluid levels within small and large bowel are noted. There are mildly distended loops of small bowel in the left upper quadrant. Spinal stabilization hardware is present from L1 through L5. IMPRESSION: There are dilated small bowel loops in the left hemiabdomen. Developing small bowel obstruction versus ileus could not be excluded. No free intraperitoneal gas. Electronically Signed   By: Marybelle Killings M.D.   On: 03/13/2018 18:29   US Abdomen Limited Ruq  Result Date: 03/13/2018 CLINICAL DATA:  Elevated LFTs. EXAM: ULTRASOUND ABDOMEN LIMITED RIGHT UPPER QUADRANT COMPARISON:  CT scan 02/03/2018 FINDINGS: Gallbladder: Gallbladder fundus not well seen due to overlying bowel gas. Gallbladder wall appears thickened at 4 mm and appeared prominent on prior CT is well. Tiny calcified stone seen on the previous CT scan not evident on  ultrasound today. Common bile duct: Diameter: 7 mm Liver: Increased echogenicity of liver parenchyma with decreased acoustic through transmission suggests fatty deposition. Mild intrahepatic biliary duct dilatation evident. Small volume ascites evident. Portal vein is patent on color Doppler imaging with normal direction of blood flow towards the liver. IMPRESSION: Nondistended gallbladder with mild gallbladder wall thickening. Tiny calcified gallstones seen on previous CT scan not readily evident on today's ultrasound. Mild intra and extrahepatic biliary duct dilatation. Small volume ascites. Electronically Signed   By: Misty Stanley M.D.   On: 03/13/2018 19:01    Procedures Procedures (including critical care time)  Medications Ordered in ED Medications  HYDROmorphone (DILAUDID) injection 1 mg (1 mg Intravenous Given 03/13/18 1709)     Initial Impression / Assessment and Plan / ED Course  I have reviewed the triage vital signs and the nursing notes.  Pertinent labs & imaging results that were available during my care of the patient were reviewed by me and considered in my medical decision making (see chart for details).     Patient abdominal pain.  Has stomach cancer.  Recently started chemotherapy but now bilirubin up.  Ultrasound done and shows mild ductal dilatation and mild wall thickening without other cholecystitis.  LFTs are actually decreasing.  Lab work done at oncology but reviewed.  Will admit to [facility] for GI consult and likely MRCP or ERCP  Final Clinical Impressions(s) / ED Diagnoses   Final diagnoses:  Bilirubinemia  Abdominal pain  Malignant neoplasm of stomach, unspecified location [facility])    ED Discharge Orders    None       Davonna Belling, MD 03/13/18 1927

## 2018-03-13 NOTE — H&P (Signed)
History and Physical    PLEASE NOTE THAT DRAGON DICTATION SOFTWARE WAS USED IN THE CONSTRUCTION OF THIS NOTE.   Steven Ortega WYO:[id number] DOB: [birth year] DOA: 03/13/2018  PCP: Christain Sacramento, MD Patient coming from: home  I have personally briefly reviewed patient's old medical records in [place]  Chief Complaint: elevated bilirubin  HPI: Steven Ortega is a 63 y.o. male with medical history significant for recently diagnosed stage II stomach cancer, chronic iron deficiency anemia, hypothyroidism, who is admitted to [facility] long hospital on 03/13/2018 by way of transfer from [place] emergency department for further evaluation of acute hyperbilirubinemia.   The patient was recently diagnosed with stage II stomach cancer, with first chemotherapy initiated on 03/06/2018.  Over the course of the ensuing week, the patient reports progressive jaundice appearance associated with intermittent right upper quadrant abdominal discomfort.  Reports mild intermittent nausea in the absence of any associated vomiting.  Denies any interval change in bowel habits, including no diarrhea or tan-colored stool.  Denies any associated subjective fever, chills, rigors, or rash other than aforementioned jaundice.  In following up with his outpatient oncologist, Dr. Marin Olp, CMP was ordered and identified new onset hyperbilirubinemia, with direct predominance, prompting instruction for the patient to present to the emergency department for further evaluation.  Specifically, CMP ordered by outpatient oncology today was notable for total bilirubin of 7.1 relative to 0.9 on 03/06/2018; direct bilirubin noted to be 5.2; additionally alkaline phosphatase noted to be 326 relative to 65 on 02/21/2018; AST 76 compared to 283 on 03/06/2018, and ALT 173 compared to 404 on 03/06/2018.  Of note, CT abdomen/pelvis with contrast performed on 02/04/2008 pain showed cholelithiasis in the absence of any evidence of  choledocholithiasis.   [place] emergency department course: Vital signs in the emergency following: Temperature 98.7, heart rate 73-76, blood pressure 122/85, respiratory rate 1618, and oxygen saturation 96 to 100% on room air.  Labs in the ED were notable for the following: CBC notable for white blood cell count of 5100 with 71% neutrophils.  Ultrasound of the gallbladder showed mild gallbladder wall thickening as well as mild common bile duct dilation in the absence of any overt choledocholithiasis, gallbladder distention, or pericholecystic fluid.  While in the med center [facility] ED, the patient received Dilaudid 1 mg IV x1.  Subsequently, the patient was transferred to [facility] for further evaluation and management of presenting acute hyperbilirubinemia.    Review of Systems: As per HPI otherwise 10 point review of systems negative.   Past Medical History:  Diagnosis Date  . Adenomatous colon polyp   . Anal fissure   . Anemia   . Anxiety   . Arthritis   . Cancer of lesser curvature of stomach ([place]) 02/21/2018  . Constipation   . Depression   . Diverticulosis   . GERD (gastroesophageal reflux disease)   . Goals of care, counseling/discussion 02/21/2018  . Hearing aid worn    b/l  . HOH (hard of hearing)   . Hyperlipidemia   . Hypertension   . Hypothyroidism   . Iron deficiency anemia due to chronic blood loss 02/28/2018  . Kidney stones   . Kidney stones   . Pyloric erosion   . UNSPECIFIED ANEMIA 03/11/2008   Qualifier: Diagnosis of  By: Theda Sers CMA, Anderson Malta    . Wears dentures   . Wears glasses     Past Surgical History:  Procedure Laterality Date  .  ANAL FISSURE REPAIR  2010  . APPENDECTOMY  1984  . COLONOSCOPY W/ BIOPSIES AND POLYPECTOMY    . HERNIA REPAIR    . IR IMAGING GUIDED PORT INSERTION  03/04/2018  . LUMBAR FUSION  2013  . SHOULDER ARTHROSCOPY WITH SUBACROMIAL DECOMPRESSION, ROTATOR CUFF REPAIR AND BICEP TENDON REPAIR Left  12/09/2017   Procedure: LEFT SHOULDER ARTHROSCOPY SUBACROMIAL DECOMPRESSION, ROTATOR CUFF TEAR REPAIR;  Surgeon: Meredith Pel, MD;  Location: [place];  Service: Orthopedics;  Laterality: Left;  . THUMB FUSION Left 2008  . THUMB FUSION Right 2008   x 2  . TRIGGER FINGER RELEASE Left 2008   2nd finger    Social History:  reports that he quit smoking about 20 years ago. His smoking use included cigarettes and pipe. He quit smokeless tobacco use about 25 years ago.  His smokeless tobacco use included chew. He reports previous alcohol use. He reports that he does not use drugs.   No Known Allergies  Family History  Problem Relation Age of Onset  . Heart attack Brother        x 2  . Heart attack Father   . Lung cancer Father   . Colon cancer Neg Hx     Prior to Admission medications   Medication Sig Start Date End Date Taking? Authorizing Provider  ALPRAZolam Duanne Moron) 1 MG tablet Take 1 mg by mouth at bedtime.    Yes [provider]  buPROPion (ZYBAN) 150 MG 12 hr tablet Take 150 mg by mouth 2 (two) times daily.   Yes [provider]  citalopram (CELEXA) 40 MG tablet Take 40 mg by mouth daily.    Yes [provider]  cyclobenzaprine (FLEXERIL) 10 MG tablet Take 10 mg by mouth at bedtime.   Yes [provider]  docusate sodium (STOOL SOFTENER) 100 MG capsule Take 100 mg by mouth daily.    Yes [provider]  HYDROmorphone (DILAUDID) 2 MG tablet Take 1 tablet (2 mg total) by mouth every 4 (four) hours as needed for severe pain. 03/10/18  Yes Ennever, Rudell Cobb, MD  levothyroxine (SYNTHROID, LEVOTHROID) 25 MCG tablet Take 25 mcg by mouth daily before breakfast.   Yes [provider]  lidocaine-prilocaine (EMLA) cream Apply to affected area once 02/28/18  Yes Ennever, Rudell Cobb, MD  lipase/protease/amylase (CREON) 36000 UNITS CPEP capsule Take 2 capsules (72,000 Units total) by mouth 3 (three) times daily before meals. 02/28/18  Yes Ennever,  Rudell Cobb, MD  ondansetron (ZOFRAN) 8 MG tablet Take 1 tablet (8 mg total) by mouth 2 (two) times daily as needed for refractory nausea / vomiting. Start on day 3 after chemotherapy. 02/28/18  Yes Volanda Napoleon, MD  pantoprazole (PROTONIX) 40 MG tablet Take 1 tablet (40 mg total) by mouth 2 (two) times daily before a meal. 02/06/18  Yes Esterwood, Amy S, PA-C  polyethylene glycol (MIRALAX / GLYCOLAX) packet Take 17 g by mouth daily.   Yes [provider]  prochlorperazine (COMPAZINE) 10 MG tablet Take 1 tablet (10 mg total) by mouth every 6 (six) hours as needed (Nausea or vomiting). 02/28/18  Yes Volanda Napoleon, MD  tamsulosin (FLOMAX) 0.4 MG CAPS capsule Take 1 capsule (0.4 mg total) by mouth daily. 09/18/15  Yes Hedges, Dellis Filbert, PA-C  metoCLOPramide (REGLAN) 10 MG tablet Take 2 tablets (20 mg total) by mouth 4 (four) times daily -  before meals and at bedtime. Patient not taking: Reported on 03/13/2018 02/28/18   Volanda Napoleon,  MD     Objective     Physical Exam: Vitals:   03/13/18 1952 03/13/18 2100 03/13/18 2117 03/13/18 2209  BP: 126/77 134/76 119/81 139/83  Pulse: 73 74 71 76  Resp: 16 16 16 17   Temp:   98.7 F (37.1 C) 98.7 F (37.1 C)  TempSrc:   Oral Oral  SpO2: 96% 97% 96% 98%  Weight:    97.4 kg  Height:    6' (1.829 m)    General: appears to be stated age; alert, oriented Skin: warm, dry; jaundice noted Head:  AT/[place] Eyes: Bilateral scleral icterus noted  Mouth:  Oral mucosa membranes appear dry, normal dentition Neck: supple; trachea midline Heart:  RRR; did not appreciate any M/R/G Lungs: CTAB, did not appreciate any wheezes, rales, or rhonchi Abdomen: + BS; soft, ND, mild right upper quadrant tenderness in the absence of any guarding, rigidity, or rebound tenderness. Extremities: no peripheral edema, no muscle wasting   Labs on Admission: I have personally reviewed following labs and imaging studies  CBC: Recent Labs  Lab 03/13/18 1400  WBC  5.1  NEUTROABS 3.7  HGB 9.5*  HCT 29.1*  MCV 85.6  PLT 599   Basic Metabolic Panel: Recent Labs  Lab 03/13/18 1500  NA 133*  K 3.2*  CL 99  CO2 24  GLUCOSE 113*  BUN 9  CREATININE 0.76  CALCIUM 8.5*   GFR: Estimated Creatinine Clearance: 112.8 mL/min (by C-G formula based on SCr of 0.76 mg/dL). Liver Function Tests: Recent Labs  Lab 03/13/18 1500  AST 76*  ALT 173*  ALKPHOS 326*  BILITOT 7.1*  PROT 6.5  ALBUMIN 3.4*   No results for input(s): LIPASE, AMYLASE in the last 168 hours. No results for input(s): AMMONIA in the last 168 hours. Coagulation Profile: No results for input(s): INR, PROTIME in the last 168 hours. Cardiac Enzymes: No results for input(s): CKTOTAL, CKMB, CKMBINDEX, TROPONINI in the last 168 hours. BNP (last 3 results) No results for input(s): PROBNP in the last 8760 hours. HbA1C: No results for input(s): HGBA1C in the last 72 hours. CBG: No results for input(s): GLUCAP in the last 168 hours. Lipid Profile: No results for input(s): CHOL, HDL, LDLCALC, TRIG, CHOLHDL, LDLDIRECT in the last 72 hours. Thyroid Function Tests: No results for input(s): TSH, T4TOTAL, FREET4, T3FREE, THYROIDAB in the last 72 hours. Anemia Panel: No results for input(s): VITAMINB12, FOLATE, FERRITIN, TIBC, IRON, RETICCTPCT in the last 72 hours. Urine analysis:    Component Value Date/Time   COLORURINE YELLOW 09/18/2015 0941   APPEARANCEUR CLEAR 09/18/2015 0941   LABSPEC 1.024 09/18/2015 0941   PHURINE 6.0 09/18/2015 0941   GLUCOSEU NEGATIVE 09/18/2015 0941   HGBUR NEGATIVE 09/18/2015 0941   BILIRUBINUR NEGATIVE 09/18/2015 0941   KETONESUR NEGATIVE 09/18/2015 0941   PROTEINUR NEGATIVE 09/18/2015 0941   NITRITE NEGATIVE 09/18/2015 0941   LEUKOCYTESUR NEGATIVE 09/18/2015 0941    Radiological Exams on Admission: Dg Abd 2 Views  Result Date: 03/13/2018 CLINICAL DATA:  Right upper quadrant pain for 6 weeks EXAM: ABDOMEN - 2 VIEW COMPARISON:  None. FINDINGS: There  is no free intraperitoneal gas. Nonspecific air-fluid levels within small and large bowel are noted. There are mildly distended loops of small bowel in the left upper quadrant. Spinal stabilization hardware is present from L1 through L5. IMPRESSION: There are dilated small bowel loops in the left hemiabdomen. Developing small bowel obstruction versus ileus could not be excluded. No free intraperitoneal gas. Electronically Signed   By: Arnell Sieving  Hoss M.D.   On: 03/13/2018 18:29   US Abdomen Limited Ruq  Result Date: 03/13/2018 CLINICAL DATA:  Elevated LFTs. EXAM: ULTRASOUND ABDOMEN LIMITED RIGHT UPPER QUADRANT COMPARISON:  CT scan 02/03/2018 FINDINGS: Gallbladder: Gallbladder fundus not well seen due to overlying bowel gas. Gallbladder wall appears thickened at 4 mm and appeared prominent on prior CT is well. Tiny calcified stone seen on the previous CT scan not evident on ultrasound today. Common bile duct: Diameter: 7 mm Liver: Increased echogenicity of liver parenchyma with decreased acoustic through transmission suggests fatty deposition. Mild intrahepatic biliary duct dilatation evident. Small volume ascites evident. Portal vein is patent on color Doppler imaging with normal direction of blood flow towards the liver. IMPRESSION: Nondistended gallbladder with mild gallbladder wall thickening. Tiny calcified gallstones seen on previous CT scan not readily evident on today's ultrasound. Mild intra and extrahepatic biliary duct dilatation. Small volume ascites. Electronically Signed   By: Misty Stanley M.D.   On: 03/13/2018 19:01    Assessment/Plan   Steven Ortega is a 63 y.o. male with medical history significant for recently diagnosed stage II stomach cancer, chronic iron deficiency anemia, hypothyroidism, who is admitted to [facility] long hospital on 03/13/2018 by way of transfer from [place] emergency department for further evaluation of acute hyperbilirubinemia.     Principal Problem:    Hyperbilirubinemia Active Problems:   Cancer of lesser curvature of stomach (HCC)   Hypokalemia   Hypothyroidism   #) Conjugated hyperbilirubinemia: Outpatient labs performed earlier today by patient's oncologist revealed acute, direct predominant hyperbilirubinemia with total bilirubin noted to be 7.1, relative to values of 0.9 and 0.4 on 03/06/2018 and 02/21/2018, respectively.  Direct bilirubin noted to be 5.2.  Ultrasound of the gallbladder performed this evening showed mild gallbladder wall thickening and mild common bile duct dilation, but no evidence of choledocholithiasis or pericholecystic fluid.  Given direct predominance of patient's hyperbilirubinemia along with elevation of alkaline phosphatase, presentation appears consistent with a cholestatic pattern, however, it is currently unclear if the suspected obstruction is on the basis of intrinsic versus extrinsic factors relative to the common bile duct, particularly given patient's recent diagnosis of stomach cancer as well as the absence of stone identified within the common bile duct per this evening's ultrasound of the gallbladder.  Consequently, additional evaluation for determination of presence of stone is warranted. Will order MRCP at this time, and anticipate GI consult in the morning. Patient does not appear septic at this time.  Plan: MRCP, with anticipation of GI consult in the morning.  We will keep patient n.p.o. in the meantime.  IV fluids.  PRN IV Dilaudid.  Will recheck CMP in the morning as well as repeat direct bilirubin at that time. SCD's.     #) Hypokalemia: Presenting labs reflect potassium 3.2.  Plan: Potassium chloride 40 mEq IV over 4 hours x 1 now.  Will re-check via BMP in the morning.  I also ordered a serum magnesium level was previously collected in the ED, with subsequent PRN supplementation on the basis of this ensuing value.  Recheck serum magnesium level in the morning.    #) Acquired hypothyroidism: On  Synthroid as an outpatient.  Plan: We will hold her in Synthroid for now the setting of n.p.o. status, so described above.    #) GERD: On twice daily Protonix as an outpatient.  Plan: In the setting of current n.p.o. status, will hold home oral Protonix, and provide IV administration twice daily in its  place.    DVT prophylaxis: scd's Code Status: full Family Communication: (none) Disposition Plan:  Per Rounding Team Consults called: (none)  Admission status: inpatient, med/surg.    PLEASE NOTE THAT DRAGON DICTATION SOFTWARE WAS USED IN THE CONSTRUCTION OF THIS NOTE.   [facility] Pager [phone] From 6 PM - 2 AM.  Otherwise, please contact night-coverage  [email or web address] Password [email or web address]  03/13/2018, 10:31 PM

## 2018-03-13 NOTE — ED Notes (Signed)
Pt resting quietly, comfort measures provided. Wife at side

## 2018-03-13 NOTE — ED Notes (Signed)
Port at Rt upper chest accessed, labs obtained as per EDP orders

## 2018-03-13 NOTE — Progress Notes (Signed)
Patient in today for lab recheck. Wife is anxious about patient's liver enzymes. She is eager to find out what they are today. She thinks patient's skin looks yellow.  She is also worried about his nutrition as she doesn't think he is eating enough.  Patient states he is doing well after chemo. He's fatigued and sometimes has a hard time getting out of bed. He denies any nausea or vomiting. His bowels are normal. He states he's not hungry and doesn't like supplements. Patient's skin does have a slightly yellow pigment. Recommended that the patient try [employer] with Whole Milk as this is sometimes more palatable than ensure or boost.   Unfortunately the CMP shows the liver enzymes to still be elevated, but the total bilirubin has increased significantly. Dr Marin Olp would like the patient to go the emergency room for more extensive workup. Patient and wife are understandably upset with this. I will follow up with them tomorrow.

## 2018-03-13 NOTE — Telephone Encounter (Signed)
Can you please dictate an encounter note for me, stating this?  So I can send to the [place]?

## 2018-03-13 NOTE — ED Notes (Signed)
Family at bedside. 

## 2018-03-13 NOTE — ED Notes (Signed)
[facility] notified (Steven Ortega) - patient ready for transport 

## 2018-03-13 NOTE — ED Notes (Signed)
Pt and wife states that Oncologist sent patient to ED for possible Gall Bladder issues. Pt has diffuse abd pain and radiates to back. Pt states pain began 6 weeks and today pain is no more worse or less than usual. Denies any vomiting , states had nml BM this am

## 2018-03-13 NOTE — ED Notes (Signed)
Safety measures in place, instructed pt not to get up without nsg assistance. callbell within reach

## 2018-03-13 NOTE — ED Notes (Signed)
Patient transported to Ultrasound via stretcher, sr x 2 up.

## 2018-03-13 NOTE — Progress Notes (Signed)
63 year old male with recently diagnosed stomach cancer, having undergone first chemotherapy 3-4 days ago, who is being transferred to [place] from med-center [facility] for further evaluation of acute hyperbilirubinemia. Plan for further evaluation via ERCP vs MRCP.    Babs Bertin, DO Hospitalist

## 2018-03-14 ENCOUNTER — Inpatient Hospital Stay (HOSPITAL_COMMUNITY): Payer: Medicare Other

## 2018-03-14 DIAGNOSIS — Z87891 Personal history of nicotine dependence: Secondary | ICD-10-CM

## 2018-03-14 DIAGNOSIS — D500 Iron deficiency anemia secondary to blood loss (chronic): Secondary | ICD-10-CM

## 2018-03-14 DIAGNOSIS — E876 Hypokalemia: Secondary | ICD-10-CM

## 2018-03-14 DIAGNOSIS — K831 Obstruction of bile duct: Secondary | ICD-10-CM

## 2018-03-14 DIAGNOSIS — E039 Hypothyroidism, unspecified: Secondary | ICD-10-CM | POA: Diagnosis present

## 2018-03-14 DIAGNOSIS — C801 Malignant (primary) neoplasm, unspecified: Secondary | ICD-10-CM

## 2018-03-14 DIAGNOSIS — C169 Malignant neoplasm of stomach, unspecified: Secondary | ICD-10-CM

## 2018-03-14 DIAGNOSIS — R748 Abnormal levels of other serum enzymes: Secondary | ICD-10-CM

## 2018-03-14 LAB — COMPREHENSIVE METABOLIC PANEL
ALT: 164 U/L — ABNORMAL HIGH (ref 0–44)
AST: 85 U/L — ABNORMAL HIGH (ref 15–41)
Albumin: 2.5 g/dL — ABNORMAL LOW (ref 3.5–5.0)
Alkaline Phosphatase: 269 U/L — ABNORMAL HIGH (ref 38–126)
Anion gap: 10 (ref 5–15)
BUN: 8 mg/dL (ref 8–23)
CO2: 22 mmol/L (ref 22–32)
Calcium: 8.5 mg/dL — ABNORMAL LOW (ref 8.9–10.3)
Chloride: 103 mmol/L (ref 98–111)
Creatinine, Ser: 0.7 mg/dL (ref 0.61–1.24)
GFR calc Af Amer: >60 mL/min (ref >60)
GFR calc non Af Amer: >60 mL/min (ref >60)
Glucose, Bld: 106 mg/dL — ABNORMAL HIGH (ref 70–99)
Potassium: 3.5 mmol/L (ref 3.5–5.1)
Sodium: 135 mmol/L (ref 135–145)
Total Bilirubin: 7.8 mg/dL — ABNORMAL HIGH (ref 0.3–1.2)
Total Protein: 6.4 g/dL — ABNORMAL LOW (ref 6.5–8.1)

## 2018-03-14 LAB — IRON AND TIBC
Iron: 22 ug/dL — ABNORMAL LOW (ref 42–163)
Iron: 20 ug/dL — ABNORMAL LOW (ref 45–182)
Saturation Ratios: 13 % — ABNORMAL LOW (ref 20–55)
Saturation Ratios: 11 % — ABNORMAL LOW (ref 17.9–39.5)
TIBC: 173 ug/dL — ABNORMAL LOW (ref 202–409)
TIBC: 174 ug/dL — AB (ref 250–450)
UIBC: 150 ug/dL (ref 117–376)
UIBC: 154 ug/dL

## 2018-03-14 LAB — CBC
HCT: 28 % — ABNORMAL LOW (ref 39.0–52.0)
HEMATOCRIT: 28.9 % — AB (ref 39.0–52.0)
Hemoglobin: 9 g/dL — ABNORMAL LOW (ref 13.0–17.0)
Hemoglobin: 8.7 g/dL — ABNORMAL LOW (ref 13.0–17.0)
MCH: 27.2 pg (ref 26.0–34.0)
MCH: 27.4 pg (ref 26.0–34.0)
MCHC: 31.1 g/dL (ref 30.0–36.0)
MCHC: 31.1 g/dL (ref 30.0–36.0)
MCV: 87.3 fL (ref 80.0–100.0)
MCV: 88.1 fL (ref 80.0–100.0)
Platelets: 360 10*3/uL (ref 150–400)
Platelets: 332 10*3/uL (ref 150–400)
RBC: 3.31 MIL/uL — ABNORMAL LOW (ref 4.22–5.81)
RBC: 3.18 MIL/uL — ABNORMAL LOW (ref 4.22–5.81)
RDW: 14.5 % (ref 11.5–15.5)
RDW: 14.4 % (ref 11.5–15.5)
WBC: 5.2 10*3/uL (ref 4.0–10.5)
WBC: 4.8 10*3/uL (ref 4.0–10.5)
nRBC: 0 % (ref 0.0–0.2)
nRBC: 0 % (ref 0.0–0.2)

## 2018-03-14 LAB — RETICULOCYTES
Immature Retic Fract: 22.6 % — ABNORMAL HIGH (ref 2.3–15.9)
RBC.: 3.18 MIL/uL — ABNORMAL LOW (ref 4.22–5.81)
RETIC COUNT ABSOLUTE: 27 10*3/uL (ref 19.0–186.0)
Retic Ct Pct: 0.9 % (ref 0.4–3.1)

## 2018-03-14 LAB — CREATININE, SERUM
Creatinine, Ser: 0.68 mg/dL (ref 0.61–1.24)
GFR calc Af Amer: >60 mL/min (ref >60)
GFR calc non Af Amer: >60 mL/min (ref >60)

## 2018-03-14 LAB — VITAMIN B12: Vitamin B-12: 956 pg/mL — ABNORMAL HIGH (ref 180–914)

## 2018-03-14 LAB — HIV ANTIBODY (ROUTINE TESTING W REFLEX): HIV Screen 4th Generation wRfx: NONREACTIVE

## 2018-03-14 LAB — MAGNESIUM: Magnesium: 2.1 mg/dL (ref 1.7–2.4)

## 2018-03-14 LAB — FOLATE: Folate: 22.4 ng/mL (ref >5.9)

## 2018-03-14 LAB — LACTATE DEHYDROGENASE: LDH: 102 U/L (ref 98–192)

## 2018-03-14 LAB — FERRITIN
Ferritin: 1382 ng/mL — ABNORMAL HIGH (ref 24–336)
Ferritin: 969 ng/mL — ABNORMAL HIGH (ref 24–336)

## 2018-03-14 LAB — BILIRUBIN, DIRECT: Bilirubin, Direct: 5.2 mg/dL — ABNORMAL HIGH (ref 0.0–0.2)

## 2018-03-14 MED ORDER — SODIUM CHLORIDE 0.9 % IV SOLN
1.5000 g | INTRAVENOUS | Status: AC
  Administered 2018-03-15: 1.5 g via INTRAVENOUS
  Filled 2018-03-14: qty 1.5

## 2018-03-14 MED ORDER — PANTOPRAZOLE SODIUM 40 MG IV SOLR
40.0000 mg | Freq: Two times a day (BID) | INTRAVENOUS | Status: DC
  Administered 2018-03-14 – 2018-03-18 (×9): 40 mg via INTRAVENOUS
  Filled 2018-03-14 (×10): qty 40

## 2018-03-14 MED ORDER — SODIUM CHLORIDE 0.9 % IV SOLN
1.5000 g | Freq: Once | INTRAVENOUS | Status: DC
  Filled 2018-03-14: qty 1.5

## 2018-03-14 MED ORDER — INDOMETHACIN 50 MG RE SUPP
100.0000 mg | Freq: Once | RECTAL | Status: DC
  Filled 2018-03-14: qty 2

## 2018-03-14 MED ORDER — HEPARIN SODIUM (PORCINE) 5000 UNIT/ML IJ SOLN
5000.0000 [IU] | Freq: Three times a day (TID) | INTRAMUSCULAR | Status: DC
  Administered 2018-03-14: 5000 [IU] via SUBCUTANEOUS
  Filled 2018-03-14: qty 1

## 2018-03-14 MED ORDER — SODIUM CHLORIDE 0.9 % IV SOLN: INTRAVENOUS | Status: DC

## 2018-03-14 MED ORDER — HYDROMORPHONE HCL 1 MG/ML IJ SOLN: 0.5000 mg | INTRAMUSCULAR | Status: DC | PRN

## 2018-03-14 MED ORDER — FENTANYL CITRATE (PF) 100 MCG/2ML IJ SOLN
25.0000 ug | Freq: Once | INTRAMUSCULAR | Status: AC
  Administered 2018-03-14: 25 ug via INTRAVENOUS
  Filled 2018-03-14: qty 2

## 2018-03-14 MED ORDER — INDOMETHACIN 50 MG RE SUPP
100.0000 mg | RECTAL | Status: AC
  Filled 2018-03-14: qty 2

## 2018-03-14 MED ORDER — GADOBUTROL 1 MMOL/ML IV SOLN
10.0000 mL | Freq: Once | INTRAVENOUS | Status: AC | PRN
  Administered 2018-03-14: 9 mL via INTRAVENOUS

## 2018-03-14 MED ORDER — MORPHINE SULFATE (PF) 2 MG/ML IV SOLN
2.0000 mg | INTRAVENOUS | Status: DC | PRN
  Administered 2018-03-14 – 2018-03-15 (×6): 3 mg via INTRAVENOUS
  Administered 2018-03-15: 2 mg via INTRAVENOUS
  Administered 2018-03-15 (×3): 3 mg via INTRAVENOUS
  Administered 2018-03-16 (×2): 2 mg via INTRAVENOUS
  Administered 2018-03-16: 3 mg via INTRAVENOUS
  Filled 2018-03-14 (×5): qty 2
  Filled 2018-03-14 (×2): qty 1
  Filled 2018-03-14: qty 2
  Filled 2018-03-14: qty 1
  Filled 2018-03-14 (×4): qty 2

## 2018-03-14 MED ORDER — SODIUM CHLORIDE 0.9 % IV SOLN
510.0000 mg | Freq: Once | INTRAVENOUS | Status: AC
  Administered 2018-03-14: 510 mg via INTRAVENOUS
  Filled 2018-03-14: qty 17

## 2018-03-14 NOTE — Consult Note (Signed)
Referral MD  Reason for Referral: Stage II gastric cancer-hyperbilirubinemia  Chief Complaint  Patient presents with  . Abnormal Lab  : My eyes are yellow.  HPI: Mr. Safley is well-known to me.  He is a very nice 63 year old white male who is in great shape.  He was diagnosed recently with clinical stage IIA gastric cancer.  He was started on neoadjuvant chemotherapy with 5-FU/oxaliplatin/Taxotere.  He got his first cycle on 03/06/2018.  He had markedly elevated SGPT and SGOT at that time.  His bilirubin was normal.  He came back to the office yesterday just to have his lab work done.  He had scleral icterus.  His SGPT and SGOT were markedly improved.  However, his bilirubin was 7.1.  I felt that he had to be admitted for an evaluation.  I will know if he has a gallstone that might be causing the issues.  He has had better pain control.  He has had intermittent abdominal discomfort.  He was eating a little bit better.  There is no obvious bleeding.  He was admitted.  He had an abdominal ultrasound which shows some intrahepatic extrahepatic biliary dilatation.  When we did his PET scan prior to treatment, he had no evidence of metastatic disease.  It looks like he had some enlarged perigastric lymph nodes.  His liver was okay.  He had lab work done today.  His alkaline phosphatase is 269.  His SGPT is 164 and SGOT 85.  His bilirubin is 7.8.  His white cell count is 5.2.  Hemoglobin 9 and platelet count 360,000.  He is scheduled for a MRCP.  He says his urine is dark.  He denies any type of pruritus.  Overall, his performance status is ECOG 1.    Past Medical History:  Diagnosis Date  . Adenomatous colon polyp   . Anal fissure   . Anemia   . Anxiety   . Arthritis   . Cancer of lesser curvature of stomach ([place]) 02/21/2018  . Constipation   . Depression   . Diverticulosis   . GERD (gastroesophageal reflux disease)   . Goals of care, counseling/discussion 02/21/2018  .  Hearing aid worn    b/l  . HOH (hard of hearing)   . Hyperlipidemia   . Hypertension   . Hypothyroidism   . Iron deficiency anemia due to chronic blood loss 02/28/2018  . Kidney stones   . Kidney stones   . Pyloric erosion   . UNSPECIFIED ANEMIA 03/11/2008   Qualifier: Diagnosis of  By: Theda Sers CMA, Anderson Malta    . Wears dentures   . Wears glasses   :  Past Surgical History:  Procedure Laterality Date  . ANAL FISSURE REPAIR  2010  . APPENDECTOMY  1984  . COLONOSCOPY W/ BIOPSIES AND POLYPECTOMY    . HERNIA REPAIR    . IR IMAGING GUIDED PORT INSERTION  03/04/2018  . LUMBAR FUSION  2013  . SHOULDER ARTHROSCOPY WITH SUBACROMIAL DECOMPRESSION, ROTATOR CUFF REPAIR AND BICEP TENDON REPAIR Left 12/09/2017   Procedure: LEFT SHOULDER ARTHROSCOPY SUBACROMIAL DECOMPRESSION, ROTATOR CUFF TEAR REPAIR;  Surgeon: Meredith Pel, MD;  Location: [place];  Service: Orthopedics;  Laterality: Left;  . THUMB FUSION Left 2008  . THUMB FUSION Right 2008   x 2  . TRIGGER FINGER RELEASE Left 2008   2nd finger  :   Current Facility-Administered Medications:  .  0.9 %  sodium chloride infusion, , Intravenous, Continuous, Howerter, Justin B, DO, Last Rate: 100  mL/hr at 03/13/18 2315 .  LORazepam (ATIVAN) injection 0.5 mg, 0.5 mg, Intravenous, Q6H PRN, Howerter, Justin B, DO, 0.5 mg at 03/14/18 0645 .  ondansetron (ZOFRAN) 8 mg in sodium chloride 0.9 % 50 mL IVPB, 8 mg, Intravenous, Q6H PRN **OR** ondansetron (ZOFRAN) injection 4 mg, 4 mg, Intravenous, Q6H PRN, Howerter, Justin B, DO .  pantoprazole (PROTONIX) injection 40 mg, 40 mg, Intravenous, Q12H, Howerter, Justin B, DO:  . pantoprazole (PROTONIX) IV  40 mg Intravenous Q12H  :  No Known Allergies:  Family History  Problem Relation Age of Onset  . Heart attack Brother        x 2  . Heart attack Father   . Lung cancer Father   . Colon cancer Neg Hx   :  Social History   Socioeconomic History  . Marital status: Married    Spouse name: Not  on file  . Number of children: 2  . Years of education: Not on file  . Highest education level: Not on file  Occupational History  . Occupation: retired  Scientific laboratory technician  . Financial resource strain: Not on file  . Food insecurity:    Worry: Not on file    Inability: Not on file  . Transportation needs:    Medical: Not on file    Non-medical: Not on file  Tobacco Use  . Smoking status: Former Smoker    Types: Cigarettes, Pipe    Last attempt to quit: 03/05/1998    Years since quitting: 20.0  . Smokeless tobacco: Former Systems developer    Types: [place] date: 03/05/1993  Substance and Sexual Activity  . Alcohol use: Not Currently  . Drug use: No  . Sexual activity: Not on file  Lifestyle  . Physical activity:    Days per week: Not on file    Minutes per session: Not on file  . Stress: Not on file  Relationships  . Social connections:    Talks on phone: Not on file    Gets together: Not on file    Attends religious service: Not on file    Active member of club or organization: Not on file    Attends meetings of clubs or organizations: Not on file    Relationship status: Not on file  . Intimate partner violence:    Fear of current or ex partner: Not on file    Emotionally abused: Not on file    Physically abused: Not on file    Forced sexual activity: Not on file  Other Topics Concern  . Not on file  Social History Narrative  . Not on file  :  Pertinent items are noted in HPI.  Exam: As above Patient Vitals for the past 24 hrs:  BP Temp Temp src Pulse Resp SpO2 Height Weight  03/14/18 0544 124/71 98.3 F (36.8 C) Oral 66 16 99 % - 214 lb 11.7 oz (97.4 kg)  03/13/18 2209 139/83 98.7 F (37.1 C) Oral 76 17 98 % 6' (1.829 m) 214 lb 11.7 oz (97.4 kg)  03/13/18 2117 119/81 98.7 F (37.1 C) Oral 71 16 96 % - -  03/13/18 2100 134/76 - - 74 16 97 % - -  03/13/18 1952 126/77 - - 73 16 96 % - -  03/13/18 1930 128/83 - - 75 - 94 % - -  03/13/18 1618 122/85 98.7 F (37.1 C) Oral  95 18 100 % 6' (1.829 m) 200 lb (90.7 kg)  Recent Labs    03/13/18 1400 03/14/18 0500  WBC 5.1 5.2  HGB 9.5* 9.0*  HCT 29.1* 28.9*  PLT 366 360   Recent Labs    03/13/18 1500 03/14/18 0500  NA 133* 135  K 3.2* 3.5  CL 99 103  CO2 24 22  GLUCOSE 113* 106*  BUN 9 8  CREATININE 0.76 0.70  CALCIUM 8.5* 8.5*    Blood smear review: None  Pathology: None    Assessment and Plan: Mr. Umstead is a 63 year old white male.  He has clinical stage IIa gastric cancer.  He is getting neoadjuvant chemotherapy in order to make him surgically resectable so that all cancer is resected.  He now has hyper bilirubinemia.  I do not think that this is from his chemotherapy.  I dose adjusted his chemotherapy.  I would think if chemotherapy was doing this, his LFTs would be a lot higher.  Hopefully, the MRCP will show [place] a problem.  If there is some gallstone issue, maybe this will be able to be extracted.  If he needs surgery, I do not see any problem with him having surgery.  He is in very good shape.  His blood counts were okay.  I do not think there would be an issue with increased risk of infection with surgery or delayed healing with surgery.  As always, he is in good spirits.  I probably would also have him on subcu heparin for DVT prophylaxis.  I very much appreciate everybody's help.  We will follow along and help out any way that we can.   Lattie Haw, MD  Darlyn Chamber 30:17

## 2018-03-14 NOTE — Consult Note (Addendum)
Referring Provider:  Dr. Cyndia Skeeters, [facility] Primary Care Physician:  Christain Sacramento, MD Primary Gastroenterologist:  Dr. Carlean Purl  Reason for Consultation:  Elevated LFT's  HPI: Steven GOSCH is a 63 y.o. male with newly diagnosed stage IIA gastric cancer (diagnosed early December).  Follows with Dr. Marin Olp.  He was started on neoadjuvant chemotherapy with 5-FU/oxaliplatin/Taxotere.  He got his first cycle on 03/06/2018.   LFT's were normal 3 weeks ago.  First noted to have elevated LFT's on 1/2 prior to receiving his chemo dose, which resulted in holding one of the medications and reducing the doses of the others.  Was admitted yesterday, 1/9, when it was discovered that his total bili had increased significantly on repeat labs.  Elevated on admission with trend as follows:  AST 283->76->85, ALT 404->173->164->, ALP 306->326->269, and total bili 0.9->7.1->7.8.  Ultrasound showed the following:  IMPRESSION: Nondistended gallbladder with mild gallbladder wall thickening. Tiny calcified gallstones seen on previous CT scan not readily evident on today's ultrasound.  Mild intra and extrahepatic biliary duct dilatation.  Small volume ascites.  MRI abdomen/MRCP from earlier today showed the following:  IMPRESSION: 1. Continued mural thickening throughout the distal stomach, which now appears infiltrative into the adjacent soft tissues invading the porta hepatis where it exerts mass effect on the proximal common bile duct. At this time, this is associated with mild intrahepatic biliary ductal dilatation. 2. Iron deposition in the liver and spleen. 3. Moderate volume of ascites. 4. Trace bilateral pleural effusions lying dependently. 5. Aortic atherosclerosis. 6. Mild cardiomegaly.  He reports generalized abdominal pain and sometimes more localized RUQ abdominal pain, but says that this has all been present since November so they have been contributing it to the cancer.   Past Medical  History:  Diagnosis Date  . Adenomatous colon polyp   . Anal fissure   . Anemia   . Anxiety   . Arthritis   . Cancer of lesser curvature of stomach ([place]) 02/21/2018  . Constipation   . Depression   . Diverticulosis   . GERD (gastroesophageal reflux disease)   . Goals of care, counseling/discussion 02/21/2018  . Hearing aid worn    b/l  . HOH (hard of hearing)   . Hyperlipidemia   . Hypertension   . Hypothyroidism   . Iron deficiency anemia due to chronic blood loss 02/28/2018  . Kidney stones   . Kidney stones   . Pyloric erosion   . UNSPECIFIED ANEMIA 03/11/2008   Qualifier: Diagnosis of  By: Theda Sers CMA, Anderson Malta    . Wears dentures   . Wears glasses     Past Surgical History:  Procedure Laterality Date  . ANAL FISSURE REPAIR  2010  . APPENDECTOMY  1984  . COLONOSCOPY W/ BIOPSIES AND POLYPECTOMY    . HERNIA REPAIR    . IR IMAGING GUIDED PORT INSERTION  03/04/2018  . LUMBAR FUSION  2013  . SHOULDER ARTHROSCOPY WITH SUBACROMIAL DECOMPRESSION, ROTATOR CUFF REPAIR AND BICEP TENDON REPAIR Left 12/09/2017   Procedure: LEFT SHOULDER ARTHROSCOPY SUBACROMIAL DECOMPRESSION, ROTATOR CUFF TEAR REPAIR;  Surgeon: Meredith Pel, MD;  Location: [facility];  Service: Orthopedics;  Laterality: Left;  . THUMB FUSION Left 2008  . THUMB FUSION Right 2008   x 2  . TRIGGER FINGER RELEASE Left 2008   2nd finger    Prior to Admission medications   Medication Sig Start Date End Date Taking? Authorizing Provider  ALPRAZolam Duanne Moron) 1 MG tablet Take 1 mg by mouth at  bedtime.    Yes [provider]  buPROPion (ZYBAN) 150 MG 12 hr tablet Take 150 mg by mouth 2 (two) times daily.   Yes [provider]  citalopram (CELEXA) 40 MG tablet Take 40 mg by mouth daily.    Yes [provider]  cyclobenzaprine (FLEXERIL) 10 MG tablet Take 10 mg by mouth at bedtime.   Yes [provider]  docusate sodium (STOOL SOFTENER) 100 MG capsule Take 100 mg by mouth daily.    Yes  [provider]  HYDROmorphone (DILAUDID) 2 MG tablet Take 1 tablet (2 mg total) by mouth every 4 (four) hours as needed for severe pain. 03/10/18  Yes Ennever, Rudell Cobb, MD  levothyroxine (SYNTHROID, LEVOTHROID) 25 MCG tablet Take 25 mcg by mouth daily before breakfast.   Yes [provider]  lidocaine-prilocaine (EMLA) cream Apply to affected area once 02/28/18  Yes Ennever, Rudell Cobb, MD  lipase/protease/amylase (CREON) 36000 UNITS CPEP capsule Take 2 capsules (72,000 Units total) by mouth 3 (three) times daily before meals. 02/28/18  Yes Ennever, Rudell Cobb, MD  ondansetron (ZOFRAN) 8 MG tablet Take 1 tablet (8 mg total) by mouth 2 (two) times daily as needed for refractory nausea / vomiting. Start on day 3 after chemotherapy. 02/28/18  Yes Volanda Napoleon, MD  pantoprazole (PROTONIX) 40 MG tablet Take 1 tablet (40 mg total) by mouth 2 (two) times daily before a meal. 02/06/18  Yes Esterwood, Amy S, PA-C  polyethylene glycol (MIRALAX / GLYCOLAX) packet Take 17 g by mouth daily.   Yes [provider]  prochlorperazine (COMPAZINE) 10 MG tablet Take 1 tablet (10 mg total) by mouth every 6 (six) hours as needed (Nausea or vomiting). 02/28/18  Yes Volanda Napoleon, MD  tamsulosin (FLOMAX) 0.4 MG CAPS capsule Take 1 capsule (0.4 mg total) by mouth daily. 09/18/15  Yes Hedges, Dellis Filbert, PA-C  metoCLOPramide (REGLAN) 10 MG tablet Take 2 tablets (20 mg total) by mouth 4 (four) times daily -  before meals and at bedtime. Patient not taking: Reported on 03/13/2018 02/28/18   Volanda Napoleon, MD    Current Facility-Administered Medications  Medication Dose Route Frequency Provider Last Rate Last Dose  . 0.9 %  sodium chloride infusion   Intravenous Continuous Howerter, Justin B, DO 100 mL/hr at 03/14/18 0950    . heparin injection 5,000 Units  5,000 Units Subcutaneous Q8H Volanda Napoleon, MD      . LORazepam (ATIVAN) injection 0.5 mg  0.5 mg Intravenous Q6H PRN Howerter, Justin B, DO   0.5  mg at 03/14/18 0645  . morphine 2 MG/ML injection 2-3 mg  2-3 mg Intravenous Q3H PRN Wendee Beavers T, MD   3 mg at 03/14/18 1242  . ondansetron (ZOFRAN) 8 mg in sodium chloride 0.9 % 50 mL IVPB  8 mg Intravenous Q6H PRN Howerter, Justin B, DO       Or  . ondansetron (ZOFRAN) injection 4 mg  4 mg Intravenous Q6H PRN Howerter, Justin B, DO      . pantoprazole (PROTONIX) injection 40 mg  40 mg Intravenous Q12H Howerter, Justin B, DO   40 mg at 03/14/18 1242    Allergies as of 03/13/2018  . (No Known Allergies)    Family History  Problem Relation Age of Onset  . Heart attack Brother        x 2  . Heart attack Father   . Lung cancer Father   . Colon cancer Neg Hx  Social History   Socioeconomic History  . Marital status: Married    Spouse name: Not on file  . Number of children: 2  . Years of education: Not on file  . Highest education level: Not on file  Occupational History  . Occupation: retired  Scientific laboratory technician  . Financial resource strain: Not on file  . Food insecurity:    Worry: Not on file    Inability: Not on file  . Transportation needs:    Medical: Not on file    Non-medical: Not on file  Tobacco Use  . Smoking status: Former Smoker    Types: Cigarettes, Pipe    Last attempt to quit: 03/05/1998    Years since quitting: 20.0  . Smokeless tobacco: Former Systems developer    Types: [place] date: 03/05/1993  Substance and Sexual Activity  . Alcohol use: Not Currently  . Drug use: No  . Sexual activity: Not on file  Lifestyle  . Physical activity:    Days per week: Not on file    Minutes per session: Not on file  . Stress: Not on file  Relationships  . Social connections:    Talks on phone: Not on file    Gets together: Not on file    Attends religious service: Not on file    Active member of club or organization: Not on file    Attends meetings of clubs or organizations: Not on file    Relationship status: Not on file  . Intimate partner violence:    Fear of  current or ex partner: Not on file    Emotionally abused: Not on file    Physically abused: Not on file    Forced sexual activity: Not on file  Other Topics Concern  . Not on file  Social History Narrative  . Not on file    Review of Systems: ROS is O/W negative except as mentioned in HPI.  Physical Exam: Vital signs in last 24 hours: Temp:  [98.3 F (36.8 C)-98.7 F (37.1 C)] 98.3 F (36.8 C) (01/10 0544) Pulse Rate:  [66-104] 66 (01/10 0544) Resp:  [16-18] 16 (01/10 0544) BP: (119-139)/(71-85) 124/71 (01/10 0544) SpO2:  [94 %-100 %] 99 % (01/10 0544) Weight:  [90.7 kg-97.4 kg] 97.4 kg (01/10 0544) Last BM Date: 03/13/18 General:  Alert, Well-developed, well-nourished, pleasant and cooperative in NAD; jaundice noted. Head:  Normocephalic and atraumatic. Eyes:  Scleral icterus noted. Ears:  Normal auditory acuity. Mouth:  No deformity or lesions.   Lungs:  Clear throughout to auscultation.  No wheezes, crackles, or rhonchi.  Heart:  Regular rate and rhythm; no murmurs, clicks, rubs, or gallops. Abdomen:  Soft, non-distended.  BS present.  Mild diffuse TTP. Msk:  Symmetrical without gross deformities. Pulses:  Normal pulses noted. Extremities:  Without clubbing or edema. Neurologic:  Alert and oriented x 4;  grossly normal neurologically. Skin:  Intact without significant lesions or rashes. Psych:  Alert and cooperative. Normal mood and affect.  Lab Results: Recent Labs    03/13/18 1400 03/14/18 0500 03/14/18 0945  WBC 5.1 5.2 4.8  HGB 9.5* 9.0* 8.7*  HCT 29.1* 28.9* 28.0*  PLT 366 360 332   BMET Recent Labs    03/13/18 1500 03/14/18 0500 03/14/18 0945  NA 133* 135  --   K 3.2* 3.5  --   CL 99 103  --   CO2 24 22  --   GLUCOSE 113* 106*  --   BUN  9 8  --   CREATININE 0.76 0.70 0.68  CALCIUM 8.5* 8.5*  --    LFT Recent Labs    03/14/18 0500  PROT 6.4*  ALBUMIN 2.5*  AST 85*  ALT 164*  ALKPHOS 269*  BILITOT 7.8*  BILIDIR 5.2*    Studies/Results: Dg Abd 2 Views  Result Date: 03/13/2018 CLINICAL DATA:  Right upper quadrant pain for 6 weeks EXAM: ABDOMEN - 2 VIEW COMPARISON:  None. FINDINGS: There is no free intraperitoneal gas. Nonspecific air-fluid levels within small and large bowel are noted. There are mildly distended loops of small bowel in the left upper quadrant. Spinal stabilization hardware is present from L1 through L5. IMPRESSION: There are dilated small bowel loops in the left hemiabdomen. Developing small bowel obstruction versus ileus could not be excluded. No free intraperitoneal gas. Electronically Signed   By: Marybelle Killings M.D.   On: 03/13/2018 18:29   US Abdomen Limited Ruq  Result Date: 03/13/2018 CLINICAL DATA:  Elevated LFTs. EXAM: ULTRASOUND ABDOMEN LIMITED RIGHT UPPER QUADRANT COMPARISON:  CT scan 02/03/2018 FINDINGS: Gallbladder: Gallbladder fundus not well seen due to overlying bowel gas. Gallbladder wall appears thickened at 4 mm and appeared prominent on prior CT is well. Tiny calcified stone seen on the previous CT scan not evident on ultrasound today. Common bile duct: Diameter: 7 mm Liver: Increased echogenicity of liver parenchyma with decreased acoustic through transmission suggests fatty deposition. Mild intrahepatic biliary duct dilatation evident. Small volume ascites evident. Portal vein is patent on color Doppler imaging with normal direction of blood flow towards the liver. IMPRESSION: Nondistended gallbladder with mild gallbladder wall thickening. Tiny calcified gallstones seen on previous CT scan not readily evident on today's ultrasound. Mild intra and extrahepatic biliary duct dilatation. Small volume ascites. Electronically Signed   By: Misty Stanley M.D.   On: 03/13/2018 19:01   IMPRESSION:  *63 year old male with newly diagnosed gastric cancer admitted for elevated LFT's, ? Biliary obstruction.  MRI abdomen/MRCP showed gastric cancer infiltrating into the adjacent soft tissues invading  the porta hepatis and exerting mass effect on the proximal CBD.  PLAN: -Will likely need ERCP with stent placement to relieve obstruction.  Timing per Dr. Carlean Purl. -Trend LFT's. -I have discontinued his SQ heparin for now in anticipation for ERCP in AM.  Janett Billow D. Zehr  03/14/2018, 1:39 PM     [facility] Attending   I have taken an interval history, reviewed the chart and examined the patient. I agree with the Advanced Practitioner's note, impression and recommendations.   Looks like progression of gastric cancer now causing biliary obstruction.  Will plan to try to accomplish ERCP with stenting this weekend if we can arrange that.  The risks and benefits as well as alternatives of endoscopic procedure(s) have been discussed and reviewed. All questions answered. The patient agrees to proceed.  Clears tonight - NPO after midnight - timing of ERCP not yet clear  Gatha Mayer, MD, [facility] 03/14/2018 5:39 PM Pager [phone]

## 2018-03-14 NOTE — Progress Notes (Signed)
PROGRESS NOTE  Steven Ortega BPZ:[id number] DOB: [birth year] DOA: 03/13/2018 PCP: Christain Sacramento, MD   LOS: 1 day   Brief Narrative / Interim history: 63 year old male with history of stage II stoma cancer (recent diagnosis), chronic iron deficiency anemia and hypothyroidism admitted with hyperbilirubinemia to 7.1.  Followed by oncology for stomach cancer.  Subjective: Continues to complain right upper quadrant pain.  Denies nausea, vomiting or diarrhea.  Denies personal or family history of jaundice in the past.   Assessment & Plan: Principal Problem:   Hyperbilirubinemia Active Problems:   Cancer of lesser curvature of stomach ([facility])   Hypokalemia   Hypothyroidism  Acute conjugated hyperbilirubinemia: Bilirubin remains elevated with total to 7.8 and direct to 5.2 suggesting obstructive etiology versus hemolysis.  RUQ ultrasound with mild intra-and extrahepatic duct dilation.  Patient with known stage II stomach cancer.  Liver enzymes downtrending. -Follow-up MRCP. -Follow GI recommendation. -Check LDH and haptoglobin. -Continue trending CMP -Continue IV fluid  Elevated ALT/AST/alk phos: Likely due to the above.  Downtrending.  -Continue trending. -Follow MRCP  Stage II stomach cancer: Followed by oncology.  Recently diagnosed.  On chemotherapy. -Follow oncology recommendations.  Normocytic anemia: Hemoglobin 9.0.  (12.9 on 12/20).  -Continue trending CBC -Anemia panel -Continue PPI  Scheduled Meds: . heparin  5,000 Units Subcutaneous Q8H  . pantoprazole (PROTONIX) IV  40 mg Intravenous Q12H   Continuous Infusions: . sodium chloride 100 mL/hr at 03/13/18 2315  . ondansetron (ZOFRAN) IV     PRN Meds:.LORazepam, morphine injection, ondansetron (ZOFRAN) IV **OR** ondansetron (ZOFRAN) IV  DVT prophylaxis: SCD Code Status: Full code Family Communication: Wife at bedside. Disposition Plan: Remains inpatient pending MRCP and GI input.  Still with significant  hyperbilirubinemia.  Consultants:   Oncology and gastroenterology  Procedures:   MRCP  Antimicrobials:  None  Objective: Vitals:   03/13/18 2100 03/13/18 2117 03/13/18 2209 03/14/18 0544  BP: 134/76 119/81 139/83 124/71  Pulse: 74 71 76 66  Resp: '16 16 17 16  '$ Temp:  98.7 F (37.1 C) 98.7 F (37.1 C) 98.3 F (36.8 C)  TempSrc:  Oral Oral Oral  SpO2: 97% 96% 98% 99%  Weight:   97.4 kg 97.4 kg  Height:   6' (1.829 m)    No intake or output data in the 24 hours ending 03/14/18 0912 Filed Weights   03/13/18 1618 03/13/18 2209 03/14/18 0544  Weight: 90.7 kg 97.4 kg 97.4 kg    Examination:  GENERAL: Appears well. No acute distress.  HEENT: MMM.  Vision and Hearing grossly intact.  NECK: Supple.  No JVD.  LUNGS:  No IWOB. Good air movement. CTAB.  HEART:  RRR. Heart sounds normal. ABD: Bowel sounds present.  Diffuse tenderness, RUQ greater than LUQ MSK/EXT: no edema bilaterally.  SKIN: Jaundice NEURO: Awake, alert and oriented appropriately.  No gross deficit.  PSYCH: Calm. Normal affect.  Data Reviewed: I have independently reviewed following labs and imaging studies   CBC: Recent Labs  Lab 03/13/18 1400 03/14/18 0500  WBC 5.1 5.2  NEUTROABS 3.7  --   HGB 9.5* 9.0*  HCT 29.1* 28.9*  MCV 85.6 87.3  PLT 366 242   Basic Metabolic Panel: Recent Labs  Lab 03/13/18 1500 03/13/18 2236 03/14/18 0500  NA 133*  --  135  K 3.2*  --  3.5  CL 99  --  103  CO2 24  --  22  GLUCOSE 113*  --  106*  BUN 9  --  8  CREATININE 0.76  --  0.70  CALCIUM 8.5*  --  8.5*  MG  --  2.2 2.1   GFR: Estimated Creatinine Clearance: 112.8 mL/min (by C-G formula based on SCr of 0.7 mg/dL). Liver Function Tests: Recent Labs  Lab 03/13/18 1500 03/14/18 0500  AST 76* 85*  ALT 173* 164*  ALKPHOS 326* 269*  BILITOT 7.1* 7.8*  PROT 6.5 6.4*  ALBUMIN 3.4* 2.5*   No results for input(s): LIPASE, AMYLASE in the last 168 hours. No results for input(s): AMMONIA in the last 168  hours. Coagulation Profile: No results for input(s): INR, PROTIME in the last 168 hours. Cardiac Enzymes: No results for input(s): CKTOTAL, CKMB, CKMBINDEX, TROPONINI in the last 168 hours. BNP (last 3 results) No results for input(s): PROBNP in the last 8760 hours. HbA1C: No results for input(s): HGBA1C in the last 72 hours. CBG: No results for input(s): GLUCAP in the last 168 hours. Lipid Profile: No results for input(s): CHOL, HDL, LDLCALC, TRIG, CHOLHDL, LDLDIRECT in the last 72 hours. Thyroid Function Tests: No results for input(s): TSH, T4TOTAL, FREET4, T3FREE, THYROIDAB in the last 72 hours. Anemia Panel: No results for input(s): VITAMINB12, FOLATE, FERRITIN, TIBC, IRON, RETICCTPCT in the last 72 hours. Urine analysis:    Component Value Date/Time   COLORURINE YELLOW 09/18/2015 [place] 09/18/2015 0941   LABSPEC 1.024 09/18/2015 0941   PHURINE 6.0 09/18/2015 0941   GLUCOSEU NEGATIVE 09/18/2015 0941   HGBUR NEGATIVE 09/18/2015 0941   BILIRUBINUR NEGATIVE 09/18/2015 0941   KETONESUR NEGATIVE 09/18/2015 0941   PROTEINUR NEGATIVE 09/18/2015 0941   NITRITE NEGATIVE 09/18/2015 0941   LEUKOCYTESUR NEGATIVE 09/18/2015 0941   Sepsis Labs: Invalid input(s): PROCALCITONIN, LACTICIDVEN  No results found for this or any previous visit (from the past 240 hour(s)).    Radiology Studies: Dg Abd 2 Views  Result Date: 03/13/2018 CLINICAL DATA:  Right upper quadrant pain for 6 weeks EXAM: ABDOMEN - 2 VIEW COMPARISON:  None. FINDINGS: There is no free intraperitoneal gas. Nonspecific air-fluid levels within small and large bowel are noted. There are mildly distended loops of small bowel in the left upper quadrant. Spinal stabilization hardware is present from L1 through L5. IMPRESSION: There are dilated small bowel loops in the left hemiabdomen. Developing small bowel obstruction versus ileus could not be excluded. No free intraperitoneal gas. Electronically Signed   By:  Marybelle Killings M.D.   On: 03/13/2018 18:29   US Abdomen Limited Ruq  Result Date: 03/13/2018 CLINICAL DATA:  Elevated LFTs. EXAM: ULTRASOUND ABDOMEN LIMITED RIGHT UPPER QUADRANT COMPARISON:  CT scan 02/03/2018 FINDINGS: Gallbladder: Gallbladder fundus not well seen due to overlying bowel gas. Gallbladder wall appears thickened at 4 mm and appeared prominent on prior CT is well. Tiny calcified stone seen on the previous CT scan not evident on ultrasound today. Common bile duct: Diameter: 7 mm Liver: Increased echogenicity of liver parenchyma with decreased acoustic through transmission suggests fatty deposition. Mild intrahepatic biliary duct dilatation evident. Small volume ascites evident. Portal vein is patent on color Doppler imaging with normal direction of blood flow towards the liver. IMPRESSION: Nondistended gallbladder with mild gallbladder wall thickening. Tiny calcified gallstones seen on previous CT scan not readily evident on today's ultrasound. Mild intra and extrahepatic biliary duct dilatation. Small volume ascites. Electronically Signed   By: Misty Stanley M.D.   On: 03/13/2018 19:01      Benen Weida T. [facility] Pager [phone]  If 7PM-7AM, please contact night-coverage [email or web address] Password [email or web address]  03/14/2018, 9:12 AM

## 2018-03-14 NOTE — H&P (View-Only) (Signed)
Referring Provider:  Dr. Cyndia Skeeters, [facility] Primary Care Physician:  Christain Sacramento, MD Primary Gastroenterologist:  Dr. Carlean Purl  Reason for Consultation:  Elevated LFT's  HPI: Steven Ortega is a 63 y.o. male with newly diagnosed stage IIA gastric cancer (diagnosed early December).  Follows with Dr. Marin Olp.  He was started on neoadjuvant chemotherapy with 5-FU/oxaliplatin/Taxotere.  He got his first cycle on 03/06/2018.   LFT's were normal 3 weeks ago.  First noted to have elevated LFT's on 1/2 prior to receiving his chemo dose, which resulted in holding one of the medications and reducing the doses of the others.  Was admitted yesterday, 1/9, when it was discovered that his total bili had increased significantly on repeat labs.  Elevated on admission with trend as follows:  AST 283->76->85, ALT 404->173->164->, ALP 306->326->269, and total bili 0.9->7.1->7.8.  Ultrasound showed the following:  IMPRESSION: Nondistended gallbladder with mild gallbladder wall thickening. Tiny calcified gallstones seen on previous CT scan not readily evident on today's ultrasound.  Mild intra and extrahepatic biliary duct dilatation.  Small volume ascites.  MRI abdomen/MRCP from earlier today showed the following:  IMPRESSION: 1. Continued mural thickening throughout the distal stomach, which now appears infiltrative into the adjacent soft tissues invading the porta hepatis where it exerts mass effect on the proximal common bile duct. At this time, this is associated with mild intrahepatic biliary ductal dilatation. 2. Iron deposition in the liver and spleen. 3. Moderate volume of ascites. 4. Trace bilateral pleural effusions lying dependently. 5. Aortic atherosclerosis. 6. Mild cardiomegaly.  He reports generalized abdominal pain and sometimes more localized RUQ abdominal pain, but says that this has all been present since November so they have been contributing it to the cancer.   Past Medical  History:  Diagnosis Date  . Adenomatous colon polyp   . Anal fissure   . Anemia   . Anxiety   . Arthritis   . Cancer of lesser curvature of stomach ([place]) 02/21/2018  . Constipation   . Depression   . Diverticulosis   . GERD (gastroesophageal reflux disease)   . Goals of care, counseling/discussion 02/21/2018  . Hearing aid worn    b/l  . HOH (hard of hearing)   . Hyperlipidemia   . Hypertension   . Hypothyroidism   . Iron deficiency anemia due to chronic blood loss 02/28/2018  . Kidney stones   . Kidney stones   . Pyloric erosion   . UNSPECIFIED ANEMIA 03/11/2008   Qualifier: Diagnosis of  By: Theda Sers CMA, Anderson Malta    . Wears dentures   . Wears glasses     Past Surgical History:  Procedure Laterality Date  . ANAL FISSURE REPAIR  2010  . APPENDECTOMY  1984  . COLONOSCOPY W/ BIOPSIES AND POLYPECTOMY    . HERNIA REPAIR    . IR IMAGING GUIDED PORT INSERTION  03/04/2018  . LUMBAR FUSION  2013  . SHOULDER ARTHROSCOPY WITH SUBACROMIAL DECOMPRESSION, ROTATOR CUFF REPAIR AND BICEP TENDON REPAIR Left 12/09/2017   Procedure: LEFT SHOULDER ARTHROSCOPY SUBACROMIAL DECOMPRESSION, ROTATOR CUFF TEAR REPAIR;  Surgeon: Meredith Pel, MD;  Location: [place];  Service: Orthopedics;  Laterality: Left;  . THUMB FUSION Left 2008  . THUMB FUSION Right 2008   x 2  . TRIGGER FINGER RELEASE Left 2008   2nd finger    Prior to Admission medications   Medication Sig Start Date End Date Taking? Authorizing Provider  ALPRAZolam Duanne Moron) 1 MG tablet Take 1 mg by mouth at  bedtime.    Yes [provider]  buPROPion (ZYBAN) 150 MG 12 hr tablet Take 150 mg by mouth 2 (two) times daily.   Yes [provider]  citalopram (CELEXA) 40 MG tablet Take 40 mg by mouth daily.    Yes [provider]  cyclobenzaprine (FLEXERIL) 10 MG tablet Take 10 mg by mouth at bedtime.   Yes [provider]  docusate sodium (STOOL SOFTENER) 100 MG capsule Take 100 mg by mouth daily.    Yes  [provider]  HYDROmorphone (DILAUDID) 2 MG tablet Take 1 tablet (2 mg total) by mouth every 4 (four) hours as needed for severe pain. 03/10/18  Yes Ennever, Rudell Cobb, MD  levothyroxine (SYNTHROID, LEVOTHROID) 25 MCG tablet Take 25 mcg by mouth daily before breakfast.   Yes [provider]  lidocaine-prilocaine (EMLA) cream Apply to affected area once 02/28/18  Yes Ennever, Rudell Cobb, MD  lipase/protease/amylase (CREON) 36000 UNITS CPEP capsule Take 2 capsules (72,000 Units total) by mouth 3 (three) times daily before meals. 02/28/18  Yes Ennever, Rudell Cobb, MD  ondansetron (ZOFRAN) 8 MG tablet Take 1 tablet (8 mg total) by mouth 2 (two) times daily as needed for refractory nausea / vomiting. Start on day 3 after chemotherapy. 02/28/18  Yes Volanda Napoleon, MD  pantoprazole (PROTONIX) 40 MG tablet Take 1 tablet (40 mg total) by mouth 2 (two) times daily before a meal. 02/06/18  Yes Esterwood, Amy S, PA-C  polyethylene glycol (MIRALAX / GLYCOLAX) packet Take 17 g by mouth daily.   Yes [provider]  prochlorperazine (COMPAZINE) 10 MG tablet Take 1 tablet (10 mg total) by mouth every 6 (six) hours as needed (Nausea or vomiting). 02/28/18  Yes Volanda Napoleon, MD  tamsulosin (FLOMAX) 0.4 MG CAPS capsule Take 1 capsule (0.4 mg total) by mouth daily. 09/18/15  Yes Hedges, Dellis Filbert, PA-C  metoCLOPramide (REGLAN) 10 MG tablet Take 2 tablets (20 mg total) by mouth 4 (four) times daily -  before meals and at bedtime. Patient not taking: Reported on 03/13/2018 02/28/18   Volanda Napoleon, MD    Current Facility-Administered Medications  Medication Dose Route Frequency Provider Last Rate Last Dose  . 0.9 %  sodium chloride infusion   Intravenous Continuous Howerter, Justin B, DO 100 mL/hr at 03/14/18 0950    . heparin injection 5,000 Units  5,000 Units Subcutaneous Q8H Volanda Napoleon, MD      . LORazepam (ATIVAN) injection 0.5 mg  0.5 mg Intravenous Q6H PRN Howerter, Justin B, DO   0.5  mg at 03/14/18 0645  . morphine 2 MG/ML injection 2-3 mg  2-3 mg Intravenous Q3H PRN Wendee Beavers T, MD   3 mg at 03/14/18 1242  . ondansetron (ZOFRAN) 8 mg in sodium chloride 0.9 % 50 mL IVPB  8 mg Intravenous Q6H PRN Howerter, Justin B, DO       Or  . ondansetron (ZOFRAN) injection 4 mg  4 mg Intravenous Q6H PRN Howerter, Justin B, DO      . pantoprazole (PROTONIX) injection 40 mg  40 mg Intravenous Q12H Howerter, Justin B, DO   40 mg at 03/14/18 1242    Allergies as of 03/13/2018  . (No Known Allergies)    Family History  Problem Relation Age of Onset  . Heart attack Brother        x 2  . Heart attack Father   . Lung cancer Father   . Colon cancer Neg Hx  Social History   Socioeconomic History  . Marital status: Married    Spouse name: Not on file  . Number of children: 2  . Years of education: Not on file  . Highest education level: Not on file  Occupational History  . Occupation: retired  Scientific laboratory technician  . Financial resource strain: Not on file  . Food insecurity:    Worry: Not on file    Inability: Not on file  . Transportation needs:    Medical: Not on file    Non-medical: Not on file  Tobacco Use  . Smoking status: Former Smoker    Types: Cigarettes, Pipe    Last attempt to quit: 03/05/1998    Years since quitting: 20.0  . Smokeless tobacco: Former Systems developer    Types: [place] date: 03/05/1993  Substance and Sexual Activity  . Alcohol use: Not Currently  . Drug use: No  . Sexual activity: Not on file  Lifestyle  . Physical activity:    Days per week: Not on file    Minutes per session: Not on file  . Stress: Not on file  Relationships  . Social connections:    Talks on phone: Not on file    Gets together: Not on file    Attends religious service: Not on file    Active member of club or organization: Not on file    Attends meetings of clubs or organizations: Not on file    Relationship status: Not on file  . Intimate partner violence:    Fear of  current or ex partner: Not on file    Emotionally abused: Not on file    Physically abused: Not on file    Forced sexual activity: Not on file  Other Topics Concern  . Not on file  Social History Narrative  . Not on file    Review of Systems: ROS is O/W negative except as mentioned in HPI.  Physical Exam: Vital signs in last 24 hours: Temp:  [98.3 F (36.8 C)-98.7 F (37.1 C)] 98.3 F (36.8 C) (01/10 0544) Pulse Rate:  [66-104] 66 (01/10 0544) Resp:  [16-18] 16 (01/10 0544) BP: (119-139)/(71-85) 124/71 (01/10 0544) SpO2:  [94 %-100 %] 99 % (01/10 0544) Weight:  [90.7 kg-97.4 kg] 97.4 kg (01/10 0544) Last BM Date: 03/13/18 General:  Alert, Well-developed, well-nourished, pleasant and cooperative in NAD; jaundice noted. Head:  Normocephalic and atraumatic. Eyes:  Scleral icterus noted. Ears:  Normal auditory acuity. Mouth:  No deformity or lesions.   Lungs:  Clear throughout to auscultation.  No wheezes, crackles, or rhonchi.  Heart:  Regular rate and rhythm; no murmurs, clicks, rubs, or gallops. Abdomen:  Soft, non-distended.  BS present.  Mild diffuse TTP. Msk:  Symmetrical without gross deformities. Pulses:  Normal pulses noted. Extremities:  Without clubbing or edema. Neurologic:  Alert and oriented x 4;  grossly normal neurologically. Skin:  Intact without significant lesions or rashes. Psych:  Alert and cooperative. Normal mood and affect.  Lab Results: Recent Labs    03/13/18 1400 03/14/18 0500 03/14/18 0945  WBC 5.1 5.2 4.8  HGB 9.5* 9.0* 8.7*  HCT 29.1* 28.9* 28.0*  PLT 366 360 332   BMET Recent Labs    03/13/18 1500 03/14/18 0500 03/14/18 0945  NA 133* 135  --   K 3.2* 3.5  --   CL 99 103  --   CO2 24 22  --   GLUCOSE 113* 106*  --   BUN  9 8  --   CREATININE 0.76 0.70 0.68  CALCIUM 8.5* 8.5*  --    LFT Recent Labs    03/14/18 0500  PROT 6.4*  ALBUMIN 2.5*  AST 85*  ALT 164*  ALKPHOS 269*  BILITOT 7.8*  BILIDIR 5.2*    Studies/Results: Dg Abd 2 Views  Result Date: 03/13/2018 CLINICAL DATA:  Right upper quadrant pain for 6 weeks EXAM: ABDOMEN - 2 VIEW COMPARISON:  None. FINDINGS: There is no free intraperitoneal gas. Nonspecific air-fluid levels within small and large bowel are noted. There are mildly distended loops of small bowel in the left upper quadrant. Spinal stabilization hardware is present from L1 through L5. IMPRESSION: There are dilated small bowel loops in the left hemiabdomen. Developing small bowel obstruction versus ileus could not be excluded. No free intraperitoneal gas. Electronically Signed   By: Marybelle Killings M.D.   On: 03/13/2018 18:29   US Abdomen Limited Ruq  Result Date: 03/13/2018 CLINICAL DATA:  Elevated LFTs. EXAM: ULTRASOUND ABDOMEN LIMITED RIGHT UPPER QUADRANT COMPARISON:  CT scan 02/03/2018 FINDINGS: Gallbladder: Gallbladder fundus not well seen due to overlying bowel gas. Gallbladder wall appears thickened at 4 mm and appeared prominent on prior CT is well. Tiny calcified stone seen on the previous CT scan not evident on ultrasound today. Common bile duct: Diameter: 7 mm Liver: Increased echogenicity of liver parenchyma with decreased acoustic through transmission suggests fatty deposition. Mild intrahepatic biliary duct dilatation evident. Small volume ascites evident. Portal vein is patent on color Doppler imaging with normal direction of blood flow towards the liver. IMPRESSION: Nondistended gallbladder with mild gallbladder wall thickening. Tiny calcified gallstones seen on previous CT scan not readily evident on today's ultrasound. Mild intra and extrahepatic biliary duct dilatation. Small volume ascites. Electronically Signed   By: Misty Stanley M.D.   On: 03/13/2018 19:01   IMPRESSION:  *63 year old male with newly diagnosed gastric cancer admitted for elevated LFT's, ? Biliary obstruction.  MRI abdomen/MRCP showed gastric cancer infiltrating into the adjacent soft tissues invading  the porta hepatis and exerting mass effect on the proximal CBD.  PLAN: -Will likely need ERCP with stent placement to relieve obstruction.  Timing per Dr. Carlean Purl. -Trend LFT's. -I have discontinued his SQ heparin for now in anticipation for ERCP in AM.  Janett Billow D. Zehr  03/14/2018, 1:39 PM     [facility] Attending   I have taken an interval history, reviewed the chart and examined the patient. I agree with the Advanced Practitioner's note, impression and recommendations.   Looks like progression of gastric cancer now causing biliary obstruction.  Will plan to try to accomplish ERCP with stenting this weekend if we can arrange that.  The risks and benefits as well as alternatives of endoscopic procedure(s) have been discussed and reviewed. All questions answered. The patient agrees to proceed.  Clears tonight - NPO after midnight - timing of ERCP not yet clear  Gatha Mayer, MD, [facility] 03/14/2018 5:39 PM Pager [phone]

## 2018-03-14 NOTE — Progress Notes (Signed)
Brief note regarding plan, with full H&P to follow:   Steven Ortega is a 63 y.o. male with medical history significant for recently diagnosed stage II stomach cancer, chronic iron deficiency anemia, hypothyroidism, who is admitted to [facility] long hospital on 03/13/2018 by way of transfer from [place] emergency department for further evaluation of acute hyperbilirubinemia.    #) Conjugated hyperbilirubinemia: Outpatient labs performed earlier today by patient's oncologist revealed acute, direct predominant hyperbilirubinemia with total bilirubin noted to be 7.1, relative to values of 0.9 and 0.4 on 03/06/2018 and 02/21/2018, respectively.  Direct bilirubin noted to be 5.2.  Ultrasound of the gallbladder performed this evening showed mild gallbladder wall thickening and mild common bile duct dilation, but no evidence of choledocholithiasis or pericholecystic fluid.  Given direct predominance of patient's hyperbilirubinemia along with elevation of alkaline phosphatase, presentation appears consistent with a cholestatic pattern, however, it is currently unclear if the suspected obstruction is on the basis of intrinsic versus extrinsic factors relative to the common bile duct, particularly given patient's recent diagnosis of stomach cancer as well as the absence of stone identified within the common bile duct per this evening's ultrasound of the gallbladder.  Consequently, additional evaluation for determination of presence of stone is warranted. Will order MRCP at this time, and anticipate GI consult in the morning. Patient does not appear septic at this time.  Plan: MRCP, with anticipation of GI consult in the morning.  We will keep patient n.p.o. in the meantime.  IV fluids.  PRN IV Dilaudid.  Will recheck CMP in the morning as well as repeat direct bilirubin at that time. SCD's.     Babs Bertin, DO Hospitalist

## 2018-03-15 ENCOUNTER — Inpatient Hospital Stay (HOSPITAL_COMMUNITY): Payer: Medicare Other | Admitting: Anesthesiology

## 2018-03-15 ENCOUNTER — Encounter (HOSPITAL_COMMUNITY): Payer: Self-pay | Admitting: *Deleted

## 2018-03-15 ENCOUNTER — Encounter (HOSPITAL_COMMUNITY)
Admission: EM | Disposition: A | Discharge status: Other Acute Inpatient Hospital | Payer: Self-pay | Source: Home / Self Care | Attending: Internal Medicine

## 2018-03-15 HISTORY — PX: ESOPHAGOGASTRODUODENOSCOPY (EGD) WITH PROPOFOL: SHX5813

## 2018-03-15 LAB — COMPREHENSIVE METABOLIC PANEL
ALT: 140 U/L — ABNORMAL HIGH (ref 0–44)
AST: 79 U/L — ABNORMAL HIGH (ref 15–41)
Albumin: 2.5 g/dL — ABNORMAL LOW (ref 3.5–5.0)
Alkaline Phosphatase: 252 U/L — ABNORMAL HIGH (ref 38–126)
Anion gap: 7 (ref 5–15)
BUN: 9 mg/dL (ref 8–23)
CO2: 23 mmol/L (ref 22–32)
Calcium: 8.5 mg/dL — ABNORMAL LOW (ref 8.9–10.3)
Chloride: 107 mmol/L (ref 98–111)
Creatinine, Ser: 0.69 mg/dL (ref 0.61–1.24)
GFR calc Af Amer: >60 mL/min (ref >60)
Glucose, Bld: 87 mg/dL (ref 70–99)
Potassium: 3.7 mmol/L (ref 3.5–5.1)
Sodium: 137 mmol/L (ref 135–145)
Total Bilirubin: 7.9 mg/dL — ABNORMAL HIGH (ref 0.3–1.2)
Total Protein: 5.9 g/dL — ABNORMAL LOW (ref 6.5–8.1)

## 2018-03-15 LAB — CBC
HCT: 28 % — ABNORMAL LOW (ref 39.0–52.0)
Hemoglobin: 8.7 g/dL — ABNORMAL LOW (ref 13.0–17.0)
MCH: 27.5 pg (ref 26.0–34.0)
MCHC: 31.1 g/dL (ref 30.0–36.0)
MCV: 88.6 fL (ref 80.0–100.0)
Platelets: 362 10*3/uL (ref 150–400)
RBC: 3.16 MIL/uL — ABNORMAL LOW (ref 4.22–5.81)
RDW: 14.7 % (ref 11.5–15.5)
WBC: 4.1 10*3/uL (ref 4.0–10.5)
nRBC: 0 % (ref 0.0–0.2)

## 2018-03-15 LAB — HAPTOGLOBIN: Haptoglobin: 378 mg/dL — ABNORMAL HIGH (ref 32–363)

## 2018-03-15 SURGERY — ESOPHAGOGASTRODUODENOSCOPY (EGD) WITH PROPOFOL
Anesthesia: General

## 2018-03-15 MED ORDER — HEPARIN SODIUM (PORCINE) 5000 UNIT/ML IJ SOLN
5000.0000 [IU] | Freq: Three times a day (TID) | INTRAMUSCULAR | Status: DC
  Administered 2018-03-15 – 2018-03-24 (×24): 5000 [IU] via SUBCUTANEOUS
  Filled 2018-03-15 (×25): qty 1

## 2018-03-15 MED ORDER — LIDOCAINE 2% (20 MG/ML) 5 ML SYRINGE
INTRAMUSCULAR | Status: DC | PRN
  Administered 2018-03-15: 100 mg via INTRAVENOUS

## 2018-03-15 MED ORDER — ONDANSETRON HCL 4 MG/2ML IJ SOLN
INTRAMUSCULAR | Status: DC | PRN
  Administered 2018-03-15: 4 mg via INTRAVENOUS

## 2018-03-15 MED ORDER — FENTANYL CITRATE (PF) 100 MCG/2ML IJ SOLN
INTRAMUSCULAR | Status: AC
  Filled 2018-03-15: qty 2

## 2018-03-15 MED ORDER — FENTANYL CITRATE (PF) 100 MCG/2ML IJ SOLN
INTRAMUSCULAR | Status: DC | PRN
  Administered 2018-03-15: 50 ug via INTRAVENOUS

## 2018-03-15 MED ORDER — MIDAZOLAM HCL 2 MG/2ML IJ SOLN
INTRAMUSCULAR | Status: AC
  Filled 2018-03-15: qty 2

## 2018-03-15 MED ORDER — ROCURONIUM BROMIDE 10 MG/ML (PF) SYRINGE
PREFILLED_SYRINGE | INTRAVENOUS | Status: AC
  Filled 2018-03-15: qty 10

## 2018-03-15 MED ORDER — PHENYLEPHRINE 40 MCG/ML (10ML) SYRINGE FOR IV PUSH (FOR BLOOD PRESSURE SUPPORT)
PREFILLED_SYRINGE | INTRAVENOUS | Status: AC
  Filled 2018-03-15: qty 10

## 2018-03-15 MED ORDER — SUGAMMADEX SODIUM 200 MG/2ML IV SOLN
INTRAVENOUS | Status: DC | PRN
  Administered 2018-03-15: 200 mg via INTRAVENOUS

## 2018-03-15 MED ORDER — PROPOFOL 10 MG/ML IV BOLUS
INTRAVENOUS | Status: AC
  Filled 2018-03-15: qty 20

## 2018-03-15 MED ORDER — DEXAMETHASONE SODIUM PHOSPHATE 10 MG/ML IJ SOLN
INTRAMUSCULAR | Status: AC
  Filled 2018-03-15: qty 1

## 2018-03-15 MED ORDER — FENTANYL CITRATE (PF) 100 MCG/2ML IJ SOLN: 25.0000 ug | INTRAMUSCULAR | Status: DC | PRN

## 2018-03-15 MED ORDER — LIDOCAINE 2% (20 MG/ML) 5 ML SYRINGE
INTRAMUSCULAR | Status: AC
  Filled 2018-03-15: qty 5

## 2018-03-15 MED ORDER — ONDANSETRON HCL 4 MG/2ML IJ SOLN
INTRAMUSCULAR | Status: AC
  Filled 2018-03-15: qty 2

## 2018-03-15 MED ORDER — ROCURONIUM BROMIDE 10 MG/ML (PF) SYRINGE
PREFILLED_SYRINGE | INTRAVENOUS | Status: DC | PRN
  Administered 2018-03-15: 40 mg via INTRAVENOUS

## 2018-03-15 MED ORDER — DEXAMETHASONE SODIUM PHOSPHATE 10 MG/ML IJ SOLN
INTRAMUSCULAR | Status: DC | PRN
  Administered 2018-03-15: 10 mg via INTRAVENOUS

## 2018-03-15 MED ORDER — SUGAMMADEX SODIUM 200 MG/2ML IV SOLN
INTRAVENOUS | Status: AC
  Filled 2018-03-15: qty 2

## 2018-03-15 MED ORDER — PHENYLEPHRINE 40 MCG/ML (10ML) SYRINGE FOR IV PUSH (FOR BLOOD PRESSURE SUPPORT)
PREFILLED_SYRINGE | INTRAVENOUS | Status: DC | PRN
  Administered 2018-03-15: 120 ug via INTRAVENOUS
  Administered 2018-03-15 (×2): 80 ug via INTRAVENOUS

## 2018-03-15 MED ORDER — PROPOFOL 10 MG/ML IV BOLUS
INTRAVENOUS | Status: DC | PRN
  Administered 2018-03-15: 40 mg via INTRAVENOUS
  Administered 2018-03-15: 160 mg via INTRAVENOUS

## 2018-03-15 MED ORDER — MIDAZOLAM HCL 5 MG/5ML IJ SOLN
INTRAMUSCULAR | Status: DC | PRN
  Administered 2018-03-15: 1 mg via INTRAVENOUS

## 2018-03-15 MED ORDER — SUCCINYLCHOLINE CHLORIDE 200 MG/10ML IV SOSY
PREFILLED_SYRINGE | INTRAVENOUS | Status: AC
  Filled 2018-03-15: qty 10

## 2018-03-15 MED ORDER — ONDANSETRON HCL 4 MG/2ML IJ SOLN: 4.0000 mg | Freq: Once | INTRAMUSCULAR | Status: DC | PRN

## 2018-03-15 MED ORDER — SODIUM CHLORIDE 0.9 % IV SOLN
1.5000 g | INTRAVENOUS | Status: DC
  Filled 2018-03-15 (×2): qty 1.5

## 2018-03-15 NOTE — Anesthesia Preprocedure Evaluation (Addendum)
Anesthesia Evaluation  Patient identified by MRN, date of birth, ID band Patient awake    Reviewed: Allergy & Precautions, NPO status , Patient's Chart, lab work & pertinent test results  Airway Mallampati: III  TM Distance: >3 FB Neck ROM: Full    Dental  (+) Edentulous Upper, Edentulous Lower   Pulmonary former smoker,    Pulmonary exam normal breath sounds clear to auscultation       Cardiovascular hypertension, Normal cardiovascular exam Rhythm:Regular Rate:Normal  ECG: SR, rate 72   Neuro/Psych PSYCHIATRIC DISORDERS Anxiety Depression negative neurological ROS     GI/Hepatic Neg liver ROS, PUD, GERD  Medicated and Controlled,Stage IIA gastric cancer  Elevated LFT's   Endo/Other  Hypothyroidism   Renal/GU negative Renal ROS     Musculoskeletal negative musculoskeletal ROS (+)   Abdominal   Peds  Hematology  (+) anemia ,   Anesthesia Other Findings Biliary obstruction Jaundice  Reproductive/Obstetrics                            Anesthesia Physical Anesthesia Plan  ASA: III  Anesthesia Plan: General   Post-op Pain Management:    Induction: Intravenous  PONV Risk Score and Plan: 4 or greater and Dexamethasone, Ondansetron and Treatment may vary due to age or medical condition  Airway Management Planned: Oral ETT  Additional Equipment:   Intra-op Plan:   Post-operative Plan: Extubation in OR  Informed Consent: I have reviewed the patients History and Physical, chart, labs and discussed the procedure including the risks, benefits and alternatives for the proposed anesthesia with the patient or authorized representative who has indicated his/her understanding and acceptance.   Dental advisory given  Plan Discussed with: CRNA  Anesthesia Plan Comments:        Anesthesia Quick Evaluation

## 2018-03-15 NOTE — Progress Notes (Signed)
PROGRESS NOTE  Steven Ortega IOE:[id number] DOB: [birth year] DOA: 03/13/2018 PCP: Christain Sacramento, MD  HPI/Recap of past 41 hours: 63 year old male with history of stage II stoma cancer (recent diagnosis), chronic iron deficiency anemia and hypothyroidism admitted with hyperbilirubinemia to 7.1.  Followed by oncology for stomach cancer.  03/15/2018: Patient seen and examined with his wife at bedside.  No acute events overnight.  Reports significant abdominal pain prior to taking his pain medication.  ERCP planned today.  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)   Hypokalemia   Hypothyroidism   Obstructive jaundice  Acute conjugated hyperbilirubinemia:  Total bilirubin continues to be elevated at 7.8.  RUQ ultrasound with mild intra-and extrahepatic duct dilation.  Patient with known stage II stomach cancer.   Liver enzymes continue to downtrend  -Continue IV fluid hydration Possible ERCP today GI following  Jaundice in the setting of hyperbilirubinemia Management as stated above  Elevated ALT/AST/alk phos: Likely due to the above.    -Continues to downtrend  Stage II stomach cancer:  Followed by oncology Dr. Marin Olp.   Recently diagnosed.  On chemotherapy. -Follow oncology recommendations.  Normocytic anemia: Hemoglobin 9.0.  (12.9 on 12/20).  -Continue trending CBC -Anemia panel -Continue PPI   DVT prophylaxis: SCD Code Status: Full code Family Communication: Wife at bedside. Disposition Plan: Remains inpatient pending MRCP and GI input.  Still with significant hyperbilirubinemia.  Consultants:   Oncology and gastroenterology  Procedures:   MRCP  Antimicrobials:  None   Objective: Vitals:   03/14/18 0544 03/14/18 1436 03/14/18 2039 03/15/18 0400  BP: 124/71 131/81 133/79 124/71  Pulse: 66 76 72 72  Resp: '16 16 18 20  '$ Temp: 98.3 F (36.8 C) 99 F (37.2 C) 98.4 F  (36.9 C) 98.3 F (36.8 C)  TempSrc: Oral Oral Oral Oral  SpO2: 99% 97% 96% 97%  Weight: 97.4 kg   94.6 kg  Height:        Intake/Output Summary (Last 24 hours) at 03/15/2018 1303 Last data filed at 03/15/2018 0400 Gross per 24 hour  Intake 1558.25 ml  Output 400 ml  Net 1158.25 ml   Filed Weights   03/13/18 2209 03/14/18 0544 03/15/18 0400  Weight: 97.4 kg 97.4 kg 94.6 kg    Exam:  . General: 63 y.o. year-old male well developed well nourished in no acute distress.  Alert and oriented x3.  Jaundice with icteric sclera. . Cardiovascular: Regular rate and rhythm with no rubs or gallops.  No thyromegaly or JVD noted.   Marland Kitchen Respiratory: Clear to auscultation with no wheezes or rales. Good inspiratory effort. . Abdomen: Soft mild diffuse tenderness nondistended with hypoactive bowel sounds x4 quadrants. . Musculoskeletal: No lower extremity edema. 2/4 pulses in all 4 extremities. . Skin: No ulcerative lesions noted or rashes, jaundice. Marland Kitchen Psychiatry: Mood is appropriate for condition and setting   Data Reviewed: CBC: Recent Labs  Lab 03/13/18 1400 03/14/18 0500 03/14/18 0945 03/15/18 0558  WBC 5.1 5.2 4.8 4.1  NEUTROABS 3.7  --   --   --   HGB 9.5* 9.0* 8.7* 8.7*  HCT 29.1* 28.9* 28.0* 28.0*  MCV 85.6 87.3 88.1 88.6  PLT 366 360 332 182   Basic Metabolic Panel: Recent Labs  Lab 03/13/18 1500 03/13/18 2236 03/14/18 0500 03/14/18 0945 03/15/18 0558  NA 133*  --  135  --  137  K 3.2*  --  3.5  --  3.7  CL 99  --  103  --  107  CO2 24  --  22  --  23  GLUCOSE 113*  --  106*  --  87  BUN 9  --  8  --  9  CREATININE 0.76  --  0.70 0.68 0.69  CALCIUM 8.5*  --  8.5*  --  8.5*  MG  --  2.2 2.1  --   --    GFR: Estimated Creatinine Clearance: 111.4 mL/min (by C-G formula based on SCr of 0.69 mg/dL). Liver Function Tests: Recent Labs  Lab 03/13/18 1500 03/14/18 0500 03/15/18 0558  AST 76* 85* 79*  ALT 173* 164* 140*  ALKPHOS 326* 269* 252*  BILITOT 7.1* 7.8*  7.9*  PROT 6.5 6.4* 5.9*  ALBUMIN 3.4* 2.5* 2.5*   No results for input(s): LIPASE, AMYLASE in the last 168 hours. No results for input(s): AMMONIA in the last 168 hours. Coagulation Profile: No results for input(s): INR, PROTIME in the last 168 hours. Cardiac Enzymes: No results for input(s): CKTOTAL, CKMB, CKMBINDEX, TROPONINI in the last 168 hours. BNP (last 3 results) No results for input(s): PROBNP in the last 8760 hours. HbA1C: No results for input(s): HGBA1C in the last 72 hours. CBG: No results for input(s): GLUCAP in the last 168 hours. Lipid Profile: No results for input(s): CHOL, HDL, LDLCALC, TRIG, CHOLHDL, LDLDIRECT in the last 72 hours. Thyroid Function Tests: No results for input(s): TSH, T4TOTAL, FREET4, T3FREE, THYROIDAB in the last 72 hours. Anemia Panel: Recent Labs    03/13/18 1500 03/14/18 0945 03/14/18 0951  VITAMINB12  --   --  956*  FOLATE  --   --  22.4  FERRITIN 1,382*  --  969*  TIBC 173*  --  174*  IRON 22*  --  20*  RETICCTPCT  --  0.9  --    Urine analysis:    Component Value Date/Time   COLORURINE YELLOW 09/18/2015 0941   APPEARANCEUR CLEAR 09/18/2015 0941   LABSPEC 1.024 09/18/2015 0941   PHURINE 6.0 09/18/2015 0941   GLUCOSEU NEGATIVE 09/18/2015 0941   HGBUR NEGATIVE 09/18/2015 0941   BILIRUBINUR NEGATIVE 09/18/2015 0941   KETONESUR NEGATIVE 09/18/2015 0941   PROTEINUR NEGATIVE 09/18/2015 0941   NITRITE NEGATIVE 09/18/2015 0941   LEUKOCYTESUR NEGATIVE 09/18/2015 0941   Sepsis Labs: '@LABRCNTIP'$ (procalcitonin:4,lacticidven:4)  )No results found for this or any previous visit (from the past 240 hour(s)).    Studies: No results found.  Scheduled Meds: . indomethacin  100 mg Rectal On Call  . pantoprazole (PROTONIX) IV  40 mg Intravenous Q12H    Continuous Infusions: . sodium chloride 100 mL/hr at 03/15/18 0755  . sodium chloride    . ondansetron (ZOFRAN) IV       LOS: 2 days     Kayleen Memos, MD [facility] Pager [phone]  If 7PM-7AM, please contact night-coverage [email or web address] Password [email or web address] 03/15/2018, 1:03 PM

## 2018-03-15 NOTE — Anesthesia Procedure Notes (Signed)
Procedure Name: Intubation Date/Time: 03/15/2018 2:26 PM Performed by: Lind Covert, CRNA Pre-anesthesia Checklist: Patient identified, Emergency Drugs available, Suction available, Patient being monitored and Timeout performed Oxygen Delivery Method: Circle system utilized Preoxygenation: Pre-oxygenation with 100% oxygen Induction Type: IV induction Ventilation: Mask ventilation without difficulty Laryngoscope Size: Mac and 4 Grade View: Grade I Tube type: Oral Tube size: 7.5 mm Number of attempts: 1 Airway Equipment and Method: Stylet Placement Confirmation: ETT inserted through vocal cords under direct vision,  positive ETCO2 and breath sounds checked- equal and bilateral Secured at: 22 cm Tube secured with: Tape Dental Injury: Teeth and Oropharynx as per pre-operative assessment

## 2018-03-15 NOTE — Anesthesia Postprocedure Evaluation (Signed)
Anesthesia Post Note  Patient: Steven Ortega  Procedure(s) Performed: ESOPHAGOGASTRODUODENOSCOPY (EGD) WITH PROPOFOL (N/A ) Aborted ERCP     Patient location during evaluation: PACU Anesthesia Type: General Level of consciousness: awake and alert Pain management: pain level controlled Vital Signs Assessment: post-procedure vital signs reviewed and stable Respiratory status: spontaneous breathing, nonlabored ventilation, respiratory function stable and patient connected to nasal cannula oxygen Cardiovascular status: blood pressure returned to baseline and stable Postop Assessment: no apparent nausea or vomiting Anesthetic complications: no    Last Vitals:  Vitals:   03/15/18 1540 03/15/18 1545  BP: 125/70 128/70  Pulse: 77 73  Resp: 17 17  Temp:    SpO2: 96% 96%    Last Pain:  Vitals:   03/15/18 1545  TempSrc:   PainSc: 0-No pain                 Ryan P Ellender

## 2018-03-15 NOTE — Progress Notes (Signed)
Assumed care of patient from Elna Breslow, RN

## 2018-03-15 NOTE — Transfer of Care (Signed)
Immediate Anesthesia Transfer of Care Note  Patient: Steven Ortega  Procedure(s) Performed: ESOPHAGOGASTRODUODENOSCOPY (EGD) WITH PROPOFOL (N/A ) Aborted ERCP  Patient Location: PACU  Anesthesia Type:General  Level of Consciousness: sedated  Airway & Oxygen Therapy: Patient Spontanous Breathing and Patient connected to face mask oxygen  Post-op Assessment: Report given to RN and Post -op Vital signs reviewed and stable  Post vital signs: Reviewed and stable  Last Vitals:  Vitals Value Taken Time  BP    Temp    Pulse    Resp    SpO2      Last Pain:  Vitals:   03/15/18 1350  TempSrc: Oral  PainSc: 5       Patients Stated Pain Goal: 2 (30/17/20 9106)  Complications: No apparent anesthesia complications

## 2018-03-15 NOTE — Op Note (Addendum)
[facility] Patient Name: Steven Ortega Procedure Date: 03/15/2018 MRN: [id number] Attending MD: Gatha Mayer , MD Date of Birth: May 22, 1955 CSN: [id number] Age: 63 Admit Type: Inpatient Procedure:                ERCP Indications:              Abnormal MRCP, Jaundice Providers:                Gatha Mayer, MD, Elna Breslow, RN, William Dalton, Technician Referring MD:              Medicines:                General Anesthesia Complications:            No immediate complications. Estimated Blood Loss:     Estimated blood loss was minimal. Procedure:                Pre-Anesthesia Assessment:                           - Prior to the procedure, a History and Physical                            was performed, and patient medications and                            allergies were reviewed. The patient's tolerance of                            previous anesthesia was also reviewed. The risks                            and benefits of the procedure and the sedation                            options and risks were discussed with the patient.                            All questions were answered, and informed consent                            was obtained. ASA Grade Assessment: III - A patient                            with severe systemic disease. After reviewing the                            risks and benefits, the patient was deemed in                            satisfactory condition to undergo the procedure.  After obtaining informed consent, the scope was                            passed under direct vision. Throughout the                            procedure, the patient's blood pressure, pulse, and                            oxygen saturations were monitored continuously. The                            TJF-Q180V ([id number] Olympus ERCP was introduced                            through the mouth, and used to  inject contrast into                            without successful cannulation. The ERCP was                            performed with difficulty due to poor endoscopic                            visualization. The patient tolerated the procedure                            well. Scope In: Scope Out: Findings:      Esophagus not seen well. Stomach full of fluid and fibrous food. Could       see lesser curve cancer. I could not empty the stomach of food - used       Edlich tube and reduced the amount after scope suctioning but was never       able to clearly see the antrum and pylorus. We moved patient to left       lateral also and to no avail. Procedure aborted. some mucosal trauma -       mild, slight heme from scope/OG tube      No biopsies or other specimens were collected for this exam. Impression:               - Gastric cancer, food retention. Unable to exit                            stomach to attempt biliary cannulation. Moderate Sedation:      Not Applicable - Patient had care per Anesthesia. Recommendation:           - Consult IR to relieve biliary obstruction.                            explained plans to wife. Consult placed                           Avoid fiber - I started full liquids - if/when he  gets back to solid food then limited/no fiber Procedure Code(s):        --- Professional ---                           [phone], Esophagogastroduodenoscopy, flexible,                            transoral; diagnostic, including collection of                            specimen(s) by brushing or washing, when performed                            (separate procedure) Diagnosis Code(s):        --- Professional ---                           R17, Unspecified jaundice                           R93.2, Abnormal findings on diagnostic imaging of                            liver and biliary tract CPT copyright 2018 [employer]. All rights  reserved. The codes documented in this report are preliminary and upon coder review may  be revised to meet current compliance requirements. Gatha Mayer, MD 03/15/2018 3:40:05 PM This report has been signed electronically. Number of Addenda: 0

## 2018-03-15 NOTE — Interval H&P Note (Signed)
History and Physical Interval Note:  03/15/2018 2:15 PM  Steven Ortega  has presented today for surgery, with the diagnosis of Biliary obstruction  The various methods of treatment have been discussed with the patient and family. After consideration of risks, benefits and other options for treatment, the patient has consented to  Procedure(s): ENDOSCOPIC RETROGRADE CHOLANGIOPANCREATOGRAPHY (ERCP) (N/A) as a surgical intervention .  The patient's history has been reviewed, patient examined, no change in status, stable for surgery.  I have reviewed the patient's chart and labs.  Questions were answered to the patient's satisfaction.     Silvano Rusk

## 2018-03-16 LAB — COMPREHENSIVE METABOLIC PANEL
ALT: 133 U/L — AB (ref 0–44)
AST: 89 U/L — ABNORMAL HIGH (ref 15–41)
Albumin: 2.4 g/dL — ABNORMAL LOW (ref 3.5–5.0)
Alkaline Phosphatase: 267 U/L — ABNORMAL HIGH (ref 38–126)
Anion gap: 9 (ref 5–15)
BUN: 10 mg/dL (ref 8–23)
CO2: 20 mmol/L — ABNORMAL LOW (ref 22–32)
Calcium: 8.4 mg/dL — ABNORMAL LOW (ref 8.9–10.3)
Chloride: 109 mmol/L (ref 98–111)
Creatinine, Ser: 0.71 mg/dL (ref 0.61–1.24)
GFR calc non Af Amer: >60 mL/min (ref >60)
Glucose, Bld: 107 mg/dL — ABNORMAL HIGH (ref 70–99)
Potassium: 3.6 mmol/L (ref 3.5–5.1)
Sodium: 138 mmol/L (ref 135–145)
Total Bilirubin: 8.5 mg/dL — ABNORMAL HIGH (ref 0.3–1.2)
Total Protein: 5.8 g/dL — ABNORMAL LOW (ref 6.5–8.1)

## 2018-03-16 LAB — CBC
HCT: 28.7 % — ABNORMAL LOW (ref 39.0–52.0)
HEMOGLOBIN: 8.8 g/dL — AB (ref 13.0–17.0)
MCH: 26.4 pg (ref 26.0–34.0)
MCHC: 30.7 g/dL (ref 30.0–36.0)
MCV: 86.2 fL (ref 80.0–100.0)
Platelets: 432 10*3/uL — ABNORMAL HIGH (ref 150–400)
RBC: 3.33 MIL/uL — ABNORMAL LOW (ref 4.22–5.81)
RDW: 15 % (ref 11.5–15.5)
WBC: 5.2 10*3/uL (ref 4.0–10.5)
nRBC: 0 % (ref 0.0–0.2)

## 2018-03-16 LAB — PROTIME-INR
INR: 1.31
Prothrombin Time: 16.1 seconds — ABNORMAL HIGH (ref 11.4–15.2)

## 2018-03-16 MED ORDER — SENNOSIDES-DOCUSATE SODIUM 8.6-50 MG PO TABS
2.0000 | ORAL_TABLET | Freq: Two times a day (BID) | ORAL | Status: DC
  Administered 2018-03-16 – 2018-03-24 (×15): 2 via ORAL
  Filled 2018-03-16 (×17): qty 2

## 2018-03-16 MED ORDER — MORPHINE SULFATE (PF) 2 MG/ML IV SOLN
2.0000 mg | INTRAVENOUS | Status: DC | PRN
  Administered 2018-03-16 – 2018-03-17 (×10): 2 mg via INTRAVENOUS
  Filled 2018-03-16 (×10): qty 1

## 2018-03-16 NOTE — Progress Notes (Addendum)
          Daily Rounding Note  03/16/2018, 8:25 AM  LOS: 3 days   SUBJECTIVE:   Chief complaint: abdominal pain.  Gastric cancer, elevated LFTs, obstructive jaundice.      No n/v.  + early satiety and anorexia.   Anxious to get IR intervention behind him and proceed back to chemo  OBJECTIVE:         Vital signs in last 24 hours:    Temp:  [98.1 F (36.7 C)-98.5 F (36.9 C)] 98.1 F (36.7 C) (01/12 0529) Pulse Rate:  [63-81] 63 (01/12 0529) Resp:  [14-25] 17 (01/12 0529) BP: (118-148)/(70-86) 128/71 (01/12 0529) SpO2:  [94 %-100 %] 98 % (01/12 0529) Last BM Date: 03/14/18 Filed Weights   03/13/18 2209 03/14/18 0544 03/15/18 0400  Weight: 97.4 kg 97.4 kg 94.6 kg   General: jaundiced.  O/w not ill looking   Heart: RRR Chest: clear bil.  No dyspnea or cough Abdomen: soft, tender in upper abdomen without guard or rebound.  ND  Extremities: no CCE Neuro/Psych:  Oriented x 3.  No deficits, no tremor.    Hepatic Function Latest Ref Rng & Units 03/16/2018 03/15/2018 03/14/2018  Total Protein 6.5 - 8.1 g/dL 5.8(L) 5.9(L) 6.4(L)  Albumin 3.5 - 5.0 g/dL 2.4(L) 2.5(L) 2.5(L)  AST 15 - 41 U/L 89(H) 79(H) 85(H)  ALT 0 - 44 U/L 133(H) 140(H) 164(H)  Alk Phosphatase 38 - 126 U/L 267(H) 252(H) 269(H)  Total Bilirubin 0.3 - 1.2 mg/dL 8.5(H) 7.9(H) 7.8(H)  Bilirubin, Direct 0.0 - 0.2 mg/dL - - 5.2(H)     ASSESMENT:   *  Jaundice.  Dilated bile ducts.   Attempted ERCP 1/11 unsuccesful due to retained gastric contents.  Unable to clear gastric debris, so ERCP not attempted.    *    Gastric cancer.  Dx 02/2018.  On 5-FU/oxaliplatin/Taxotere starting 03/06/2018  *   Normocytic anemia.      PLAN   *   Awaiting IR note.  Pt had breakfast today so likely will purse intervention tomorrow.      Azucena Freed  03/16/2018, 8:25 AM Phone [place] Attending   I have taken an interval history, reviewed the chart and  examined the patient. I agree with the Advanced Practitioner's note, impression and recommendations.   Gatha Mayer, MD, [facility] 03/16/2018 11:04 AM Pager [phone]

## 2018-03-16 NOTE — H&P (Signed)
Chief Complaint: Obstructive jaundice  Referring Physician(s): Gatha Mayer  Supervising Physician: Jacqulynn Cadet  Patient Status: [facility] - In-pt  History of Present Illness: Steven Ortega is a 63 y.o. male with known gastric cancer who has  Started chemotherapy.  He is known to our service. Dr. Vernard Gambles placed his Port A Cath on 03/04/18.  Over the past week has has developed progressive jaundice appearance associated with intermittent right upper quadrant pain.  TBili is 8.5.  MR showed =  Continued mural thickening throughout the distal stomach, which now appears infiltrative into the adjacent soft tissues invading the porta hepatis where it exerts mass effect on the proximal common bile duct. At this time, this is associated with mild intrahepatic biliary ductal dilatation.  We are asked to perform image guided placement of a biliary drain.   Past Medical History:  Diagnosis Date  . Adenomatous colon polyp   . Anal fissure   . Anemia   . Anxiety   . Arthritis   . Cancer of lesser curvature of stomach ([place]) 02/21/2018  . Constipation   . Depression   . Diverticulosis   . GERD (gastroesophageal reflux disease)   . Goals of care, counseling/discussion 02/21/2018  . Hearing aid worn    b/l  . HOH (hard of hearing)   . Hyperlipidemia   . Hypertension   . Hypothyroidism   . Iron deficiency anemia due to chronic blood loss 02/28/2018  . Kidney stones   . Kidney stones   . Pyloric erosion   . UNSPECIFIED ANEMIA 03/11/2008   Qualifier: Diagnosis of  By: Theda Sers CMA, Anderson Malta    . Wears dentures   . Wears glasses     Past Surgical History:  Procedure Laterality Date  . ANAL FISSURE REPAIR  2010  . APPENDECTOMY  1984  . COLONOSCOPY W/ BIOPSIES AND POLYPECTOMY    . HERNIA REPAIR    . IR IMAGING GUIDED PORT INSERTION  03/04/2018  . LUMBAR FUSION  2013  . SHOULDER ARTHROSCOPY WITH SUBACROMIAL DECOMPRESSION, ROTATOR CUFF REPAIR AND BICEP TENDON REPAIR  Left 12/09/2017   Procedure: LEFT SHOULDER ARTHROSCOPY SUBACROMIAL DECOMPRESSION, ROTATOR CUFF TEAR REPAIR;  Surgeon: Meredith Pel, MD;  Location: [place];  Service: Orthopedics;  Laterality: Left;  . THUMB FUSION Left 2008  . THUMB FUSION Right 2008   x 2  . TRIGGER FINGER RELEASE Left 2008   2nd finger    Allergies: Patient has no known allergies.  Medications: Prior to Admission medications   Medication Sig Start Date End Date Taking? Authorizing Provider  ALPRAZolam Duanne Moron) 1 MG tablet Take 1 mg by mouth at bedtime.    Yes [provider]  buPROPion (ZYBAN) 150 MG 12 hr tablet Take 150 mg by mouth 2 (two) times daily.   Yes [provider]  citalopram (CELEXA) 40 MG tablet Take 40 mg by mouth daily.    Yes [provider]  cyclobenzaprine (FLEXERIL) 10 MG tablet Take 10 mg by mouth at bedtime.   Yes [provider]  docusate sodium (STOOL SOFTENER) 100 MG capsule Take 100 mg by mouth daily.    Yes [provider]  HYDROmorphone (DILAUDID) 2 MG tablet Take 1 tablet (2 mg total) by mouth every 4 (four) hours as needed for severe pain. 03/10/18  Yes Ennever, Rudell Cobb, MD  levothyroxine (SYNTHROID, LEVOTHROID) 25 MCG tablet Take 25 mcg by mouth daily before breakfast.   Yes [provider]  lidocaine-prilocaine (EMLA) cream  Apply to affected area once 02/28/18  Yes Ennever, Rudell Cobb, MD  lipase/protease/amylase (CREON) 36000 UNITS CPEP capsule Take 2 capsules (72,000 Units total) by mouth 3 (three) times daily before meals. 02/28/18  Yes Ennever, Rudell Cobb, MD  ondansetron (ZOFRAN) 8 MG tablet Take 1 tablet (8 mg total) by mouth 2 (two) times daily as needed for refractory nausea / vomiting. Start on day 3 after chemotherapy. 02/28/18  Yes Volanda Napoleon, MD  pantoprazole (PROTONIX) 40 MG tablet Take 1 tablet (40 mg total) by mouth 2 (two) times daily before a meal. 02/06/18  Yes Esterwood, Amy S, PA-C  polyethylene glycol (MIRALAX /  GLYCOLAX) packet Take 17 g by mouth daily.   Yes [provider]  prochlorperazine (COMPAZINE) 10 MG tablet Take 1 tablet (10 mg total) by mouth every 6 (six) hours as needed (Nausea or vomiting). 02/28/18  Yes Volanda Napoleon, MD  tamsulosin (FLOMAX) 0.4 MG CAPS capsule Take 1 capsule (0.4 mg total) by mouth daily. 09/18/15  Yes Hedges, Dellis Filbert, PA-C  metoCLOPramide (REGLAN) 10 MG tablet Take 2 tablets (20 mg total) by mouth 4 (four) times daily -  before meals and at bedtime. Patient not taking: Reported on 03/13/2018 02/28/18   Volanda Napoleon, MD     Family History  Problem Relation Age of Onset  . Heart attack Brother        x 2  . Heart attack Father   . Lung cancer Father   . Colon cancer Neg Hx     Social History   Socioeconomic History  . Marital status: Married    Spouse name: Not on file  . Number of children: 2  . Years of education: Not on file  . Highest education level: Not on file  Occupational History  . Occupation: retired  Scientific laboratory technician  . Financial resource strain: Not on file  . Food insecurity:    Worry: Not on file    Inability: Not on file  . Transportation needs:    Medical: Not on file    Non-medical: Not on file  Tobacco Use  . Smoking status: Former Smoker    Types: Cigarettes, Pipe    Last attempt to quit: 03/05/1998    Years since quitting: 20.0  . Smokeless tobacco: Former Systems developer    Types: [place] date: 03/05/1993  Substance and Sexual Activity  . Alcohol use: Not Currently  . Drug use: No  . Sexual activity: Not on file  Lifestyle  . Physical activity:    Days per week: Not on file    Minutes per session: Not on file  . Stress: Not on file  Relationships  . Social connections:    Talks on phone: Not on file    Gets together: Not on file    Attends religious service: Not on file    Active member of club or organization: Not on file    Attends meetings of clubs or organizations: Not on file    Relationship status: Not on  file  Other Topics Concern  . Not on file  Social History Narrative  . Not on file     Review of Systems: A 12 point ROS discussed and pertinent positives are indicated in the HPI above.  All other systems are negative.  Review of Systems  Vital Signs: BP 128/71 (BP Location: Right Arm)   Pulse 63   Temp 98.1 F (36.7 C) (Oral)   Resp 17  Ht 6' (1.829 m)   Wt 94.6 kg   SpO2 98%   BMI 28.29 kg/m   Physical Exam Vitals signs reviewed.  Constitutional:      Appearance: Normal appearance.  HENT:     Head: Normocephalic and atraumatic.  Eyes:     Extraocular Movements: Extraocular movements intact.  Neck:     Musculoskeletal: Normal range of motion.  Cardiovascular:     Rate and Rhythm: Normal rate and regular rhythm.  Pulmonary:     Effort: Pulmonary effort is normal.     Breath sounds: Normal breath sounds.  Abdominal:     Palpations: Abdomen is soft.     Comments: Tender over RUQ  Musculoskeletal: Normal range of motion.  Skin:    General: Skin is warm and dry.  Neurological:     General: No focal deficit present.     Mental Status: He is alert and oriented to person, place, and time.  Psychiatric:        Mood and Affect: Mood normal.        Behavior: Behavior normal.        Thought Content: Thought content normal.        Judgment: Judgment normal.     Imaging: Mr 3d Recon At Scanner  Result Date: 03/14/2018 CLINICAL DATA:  63 year old male with history of jaundice, abdominal pain and fever. EXAM: MRI ABDOMEN WITHOUT AND WITH CONTRAST (INCLUDING MRCP) TECHNIQUE: Multiplanar multisequence MR imaging of the abdomen was performed both before and after the administration of intravenous contrast. Heavily T2-weighted images of the biliary and pancreatic ducts were obtained, and three-dimensional MRCP images were rendered by post processing. CONTRAST:  10 mL of Gadavist. COMPARISON:  None. FINDINGS: Lower chest: Trace bilateral pleural effusions.  Mild cardiomegaly.  Hepatobiliary: Diffuse loss of signal intensity throughout the hepatic parenchyma on in phase dual echo images, and diffuse low signal intensity on T2 weighted sequences, indicative of significant hepatic iron deposition. Liver has a slightly shrunken appearance and nodular contour, suggesting underlying cirrhosis. Mild intrahepatic biliary ductal dilatation noted on MRCP images. Proximal common bile duct is not visualized on MRCP images (MRCP image 99 of series 5), with an appearance suggestive of extrinsic compression. Distal common bile duct is normal in caliber. No filling defect in the common bile duct to suggest choledocholithiasis. Gallbladder is unremarkable in appearance. Pancreas: Although the head and proximal body of the pancreas appears slightly deformed by mass effect from the adjacent gastric mass, there is no intrinsic pancreatic mass identified. No pancreatic ductal dilatation noted on MRCP images. Infiltration of the soft tissues adjacent to the pancreas, favored to be related to infiltrative gastric neoplasm, however, inflammation from pancreatitis would be difficult to entirely exclude. Spleen: Diffuse loss of signal intensity throughout the spleen on in phase dual echo images, and diffuse low signal intensity on T2 weighted sequences, indicative of iron deposition in the spleen. Adrenals/Urinary Tract: Bilateral kidneys and adrenal glands are normal in appearance. No hydroureteronephrosis in the visualized portions of the abdomen. Stomach/Bowel: Again noted is profound mural thickening and luminal narrowing in the distal body, antrum and pyloric region of the stomach. This area demonstrates diffuse enhancement on post gadolinium images, and appears infiltrative into the surrounding soft tissues, particularly posteriorly adjacent to the pancreas and extending up into the porta hepatis. This likely exerts mass effect upon the common bile duct accounting for the nonvisualized segment proximally.  Vascular/Lymphatic: Aortic atherosclerosis, without evidence of aneurysm in the abdominal vasculature. Splenic vein, superior mesenteric  vein, splenoportal confluence and portal vein all appear patent at this time. No lymphadenopathy is confidently identified in the abdomen (today's study is limited by some patient motion and imaging noise). Other:  Moderate volume of ascites. Musculoskeletal: Susceptibility artifact from the patient's indwelling spinal hardware. No aggressive appearing osseous lesions are noted in the visualized portions of the skeleton. IMPRESSION: 1. Continued mural thickening throughout the distal stomach, which now appears infiltrative into the adjacent soft tissues invading the porta hepatis where it exerts mass effect on the proximal common bile duct. At this time, this is associated with mild intrahepatic biliary ductal dilatation. 2. Iron deposition in the liver and spleen. 3. Moderate volume of ascites. 4. Trace bilateral pleural effusions lying dependently. 5. Aortic atherosclerosis. 6. Mild cardiomegaly. Electronically Signed   By: Vinnie Langton M.D.   On: 03/14/2018 14:00   Nm Pet Image Initial (pi) Skull Base To Thigh  Result Date: 02/27/2018 CLINICAL DATA:  Initial treatment strategy for gastric cancer. EXAM: NUCLEAR MEDICINE PET SKULL BASE TO THIGH TECHNIQUE: 11.35 mCi F-18 FDG was injected intravenously. Full-ring PET imaging was performed from the skull base to thigh after the radiotracer. CT data was obtained and used for attenuation correction and anatomic localization. Fasting blood glucose: 114 mg/dl COMPARISON:  CT abdomen/pelvis dated 02/03/2018 FINDINGS: Mediastinal blood pool activity: SUV max 3.1 NECK: No hypermetabolic cervical lymphadenopathy. Incidental CT findings: none CHEST: No suspicious pulmonary nodules. No hypermetabolic thoracic lymphadenopathy. Incidental CT findings: Mild atherosclerotic calcifications of the aortic arch. Three vessel coronary  atherosclerosis. ABDOMEN/PELVIS: Focal hypermetabolism along the distal lesser curvature and gastric antrum of the stomach, corresponding to the patient's known infiltrating gastric neoplasm, max SUV 6.2. Adjacent mild mesenteric stranding inferiorly and along the proximal duodenum. 11 mm short axis node along hepatoduodenal ligament (series 3/image 141), max SUV 5.3. Additional mild hypermetabolism anterior to the second portion of the duodenum, max SUV 5.6, without corresponding lymph node. 8 mm short axis portacaval node (series 3/image 144), max SUV 3.9. Trace ascites along the left pericolic gutter. Small volume pelvic ascites. No frank peritoneal nodularity or omental caking. Incidental CT findings: Atherosclerotic calcifications the abdominal aorta and branch vessels. Sigmoid diverticulosis, without evidence of diverticulitis. SKELETON: No focal hypermetabolic activity to suggest skeletal metastasis. Incidental CT findings: none IMPRESSION: Hypermetabolism along the distal lesser curvature and gastric antrum of the stomach, corresponding to the patient's known infiltrating gastric neoplasm. Small upper abdominal lymph nodes with surrounding mesenteric stranding, as above. Small volume abdominopelvic ascites. No frank peritoneal nodularity or omental caking. Electronically Signed   By: Julian Hy M.D.   On: 02/27/2018 12:33   Dg Abd 2 Views  Result Date: 03/13/2018 CLINICAL DATA:  Right upper quadrant pain for 6 weeks EXAM: ABDOMEN - 2 VIEW COMPARISON:  None. FINDINGS: There is no free intraperitoneal gas. Nonspecific air-fluid levels within small and large bowel are noted. There are mildly distended loops of small bowel in the left upper quadrant. Spinal stabilization hardware is present from L1 through L5. IMPRESSION: There are dilated small bowel loops in the left hemiabdomen. Developing small bowel obstruction versus ileus could not be excluded. No free intraperitoneal gas. Electronically Signed    By: Marybelle Killings M.D.   On: 03/13/2018 18:29   Mr Abdomen Mrcp Moise Boring Contast  Result Date: 03/14/2018 CLINICAL DATA:  63 year old male with history of jaundice, abdominal pain and fever. EXAM: MRI ABDOMEN WITHOUT AND WITH CONTRAST (INCLUDING MRCP) TECHNIQUE: Multiplanar multisequence MR imaging of the abdomen was performed both before  and after the administration of intravenous contrast. Heavily T2-weighted images of the biliary and pancreatic ducts were obtained, and three-dimensional MRCP images were rendered by post processing. CONTRAST:  10 mL of Gadavist. COMPARISON:  None. FINDINGS: Lower chest: Trace bilateral pleural effusions.  Mild cardiomegaly. Hepatobiliary: Diffuse loss of signal intensity throughout the hepatic parenchyma on in phase dual echo images, and diffuse low signal intensity on T2 weighted sequences, indicative of significant hepatic iron deposition. Liver has a slightly shrunken appearance and nodular contour, suggesting underlying cirrhosis. Mild intrahepatic biliary ductal dilatation noted on MRCP images. Proximal common bile duct is not visualized on MRCP images (MRCP image 99 of series 5), with an appearance suggestive of extrinsic compression. Distal common bile duct is normal in caliber. No filling defect in the common bile duct to suggest choledocholithiasis. Gallbladder is unremarkable in appearance. Pancreas: Although the head and proximal body of the pancreas appears slightly deformed by mass effect from the adjacent gastric mass, there is no intrinsic pancreatic mass identified. No pancreatic ductal dilatation noted on MRCP images. Infiltration of the soft tissues adjacent to the pancreas, favored to be related to infiltrative gastric neoplasm, however, inflammation from pancreatitis would be difficult to entirely exclude. Spleen: Diffuse loss of signal intensity throughout the spleen on in phase dual echo images, and diffuse low signal intensity on T2 weighted sequences,  indicative of iron deposition in the spleen. Adrenals/Urinary Tract: Bilateral kidneys and adrenal glands are normal in appearance. No hydroureteronephrosis in the visualized portions of the abdomen. Stomach/Bowel: Again noted is profound mural thickening and luminal narrowing in the distal body, antrum and pyloric region of the stomach. This area demonstrates diffuse enhancement on post gadolinium images, and appears infiltrative into the surrounding soft tissues, particularly posteriorly adjacent to the pancreas and extending up into the porta hepatis. This likely exerts mass effect upon the common bile duct accounting for the nonvisualized segment proximally. Vascular/Lymphatic: Aortic atherosclerosis, without evidence of aneurysm in the abdominal vasculature. Splenic vein, superior mesenteric vein, splenoportal confluence and portal vein all appear patent at this time. No lymphadenopathy is confidently identified in the abdomen (today's study is limited by some patient motion and imaging noise). Other:  Moderate volume of ascites. Musculoskeletal: Susceptibility artifact from the patient's indwelling spinal hardware. No aggressive appearing osseous lesions are noted in the visualized portions of the skeleton. IMPRESSION: 1. Continued mural thickening throughout the distal stomach, which now appears infiltrative into the adjacent soft tissues invading the porta hepatis where it exerts mass effect on the proximal common bile duct. At this time, this is associated with mild intrahepatic biliary ductal dilatation. 2. Iron deposition in the liver and spleen. 3. Moderate volume of ascites. 4. Trace bilateral pleural effusions lying dependently. 5. Aortic atherosclerosis. 6. Mild cardiomegaly. Electronically Signed   By: Vinnie Langton M.D.   On: 03/14/2018 14:00   Ir Imaging Guided Port Insertion  Result Date: 03/04/2018 CLINICAL DATA:  Gastric carcinoma, needs durable venous access for planned chemotherapy  regimen. EXAM: TUNNELED PORT CATHETER PLACEMENT WITH ULTRASOUND AND FLUOROSCOPIC GUIDANCE FLUOROSCOPY TIME:  0.1 minute; 33  uGym2 DAP ANESTHESIA/SEDATION: Intravenous Fentanyl and Versed were administered as conscious sedation during continuous monitoring of the patient's level of consciousness and physiological / cardiorespiratory status by the radiology RN, with a total moderate sedation time of 17 minutes. TECHNIQUE: The procedure, risks, benefits, and alternatives were explained to the patient. Questions regarding the procedure were encouraged and answered. The patient understands and consents to the procedure. As antibiotic prophylaxis, cefazolin  2 g was ordered pre-procedure and administered intravenously within one hour of incision. Patency of the right IJ vein was confirmed with ultrasound with image documentation. An appropriate skin site was determined. Skin site was marked. Region was prepped using maximum barrier technique including cap and mask, sterile gown, sterile gloves, large sterile sheet, and Chlorhexidine as cutaneous antisepsis. The region was infiltrated locally with 1% lidocaine. Under real-time ultrasound guidance, the right IJ vein was accessed with a 21 gauge micropuncture needle; the needle tip within the vein was confirmed with ultrasound image documentation. Needle was exchanged over a 018 guidewire for transitional dilator which allowed passage of the [facility] wire into the IVC. Over this, the transitional dilator was exchanged for a 5 [place] MPA catheter. A small incision was made on the right anterior chest wall and a subcutaneous pocket fashioned. The power-injectable port was positioned and its catheter tunneled to the right IJ dermatotomy site. The MPA catheter was exchanged over an Amplatz wire for a peel-away sheath, through which the port catheter, which had been trimmed to the appropriate length, was advanced and positioned under fluoroscopy with its tip at the cavoatrial  junction. Spot chest radiograph confirms good catheter position and no pneumothorax. The pocket was closed with deep interrupted and subcuticular continuous 3-0 Monocryl sutures. The port was flushed per protocol. The incisions were covered with Dermabond then covered with a sterile dressing. COMPLICATIONS: COMPLICATIONS None immediate IMPRESSION: Technically successful right IJ power-injectable port catheter placement. Ready for routine use. Electronically Signed   By: Lucrezia Europe M.D.   On: 03/04/2018 17:12   US Abdomen Limited Ruq  Result Date: 03/13/2018 CLINICAL DATA:  Elevated LFTs. EXAM: ULTRASOUND ABDOMEN LIMITED RIGHT UPPER QUADRANT COMPARISON:  CT scan 02/03/2018 FINDINGS: Gallbladder: Gallbladder fundus not well seen due to overlying bowel gas. Gallbladder wall appears thickened at 4 mm and appeared prominent on prior CT is well. Tiny calcified stone seen on the previous CT scan not evident on ultrasound today. Common bile duct: Diameter: 7 mm Liver: Increased echogenicity of liver parenchyma with decreased acoustic through transmission suggests fatty deposition. Mild intrahepatic biliary duct dilatation evident. Small volume ascites evident. Portal vein is patent on color Doppler imaging with normal direction of blood flow towards the liver. IMPRESSION: Nondistended gallbladder with mild gallbladder wall thickening. Tiny calcified gallstones seen on previous CT scan not readily evident on today's ultrasound. Mild intra and extrahepatic biliary duct dilatation. Small volume ascites. Electronically Signed   By: Misty Stanley M.D.   On: 03/13/2018 19:01    Labs:  CBC: Recent Labs    03/14/18 0500 03/14/18 0945 03/15/18 0558 03/16/18 0807  WBC 5.2 4.8 4.1 5.2  HGB 9.0* 8.7* 8.7* 8.8*  HCT 28.9* 28.0* 28.0* 28.7*  PLT 360 332 362 432*    COAGS: Recent Labs    03/04/18 1220 03/16/18 0807  INR 1.00 1.31    BMP: Recent Labs    03/13/18 1500 03/14/18 0500 03/14/18 0945  03/15/18 0558 03/16/18 0807  NA 133* 135  --  137 138  K 3.2* 3.5  --  3.7 3.6  CL 99 103  --  107 109  CO2 24 22  --  23 20*  GLUCOSE 113* 106*  --  87 107*  BUN 9 8  --  9 10  CALCIUM 8.5* 8.5*  --  8.5* 8.4*  CREATININE 0.76 0.70 0.68 0.69 0.71  GFRNONAA >60 >60 >60 >60 >60  GFRAA >60 >60 >60 >60 >60    LIVER  FUNCTION TESTS: Recent Labs    03/13/18 1500 03/14/18 0500 03/15/18 0558 03/16/18 0807  BILITOT 7.1* 7.8* 7.9* 8.5*  AST 76* 85* 79* 89*  ALT 173* 164* 140* 133*  ALKPHOS 326* 269* 252* 267*  PROT 6.5 6.4* 5.9* 5.8*  ALBUMIN 3.4* 2.5* 2.5* 2.4*    TUMOR MARKERS: No results for input(s): AFPTM, CEA, CA199, CHROMGRNA in the last 8760 hours.  Assessment and Plan:  Biliary obstruction secondary to gastric tumor.  Will proceed with image guided percutaneous biliary drain tomorrow.  *Note: The patient has some gastric outlet obstruction with food contents in the stomach. Will need to only do mild sedation/use only one medication.  Risks and benefits of biliary drain were discussed with the patient including, but not limited to bleeding, infection which may lead to sepsis or even death and damage to adjacent structures.  This interventional procedure involves the use of X-rays and because of the nature of the planned procedure, it is possible that we will have prolonged use of X-ray fluoroscopy.  Potential radiation risks to you include (but are not limited to) the following: - A slightly elevated risk for cancer  several years later in life. This risk is typically less than 0.5% percent. This risk is low in comparison to the normal incidence of human cancer, which is 33% for women and 50% for men according to the [place]. - Radiation induced injury can include skin redness, resembling a rash, tissue breakdown / ulcers and hair loss (which can be temporary or permanent).   The likelihood of either of these occurring depends on the difficulty of the  procedure and whether you are sensitive to radiation due to previous procedures, disease, or genetic conditions.   IF your procedure requires a prolonged use of radiation, you will be notified and given written instructions for further action.  It is your responsibility to monitor the irradiated area for the 2 weeks following the procedure and to notify your physician if you are concerned that you have suffered a radiation induced injury.    All of the patient's questions were answered, patient is agreeable to proceed.  Consent signed and in chart.  Thank you for this interesting consult.  I greatly enjoyed meeting MITSUGI SCHRADER and look forward to participating in their care.  A copy of this report was sent to the requesting provider on this date.  Electronically Signed: Murrell Redden, PA-C   03/16/2018, 11:55 AM      I spent a total of  40 Minutes in face to face in clinical consultation, greater than 50% of which was counseling/coordinating care for biliary drain.

## 2018-03-16 NOTE — Progress Notes (Signed)
PROGRESS NOTE  Steven Ortega VEH:[id number] DOB: 07/08/1955 DOA: 03/13/2018 PCP: Christain Sacramento, MD  HPI/Recap of past 75 hours: 63 year old male with history of stage II stoma cancer (recent diagnosis), chronic iron deficiency anemia and hypothyroidism admitted with hyperbilirubinemia to 7.1.  Followed by oncology for stomach cancer.  03/15/2018: Patient seen and examined with his wife at bedside.  No acute events overnight.  Reports significant abdominal pain prior to taking his pain medication.  ERCP planned today.  03/16/2018: Seen and examined at his bedside, wife is present.  Significant abdominal pain which is improved with IV morphine.  Plan for image guided percutaneous biliary drain tomorrow by interventional radiology.  ERCP 111 unsuccessful due to retained gastric contents.  Unable to clear gastric debris so ERCP not attempted.  Avoid fiber per GI.  N.p.o. after midnight.  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  RUQ ultrasound with mild intra-and extrahepatic duct dilation.  Patient with known stage II stomach cancer.   Liver enzymes continue to downtrend  -Continue IV fluid hydration Possible ERCP today GI following  Jaundice in the setting of hyperbilirubinemia Management as stated above  Elevated ALT/AST/alk phos: Likely due to the above.    -Continue to trend  Stage II stomach cancer:  Followed by oncology Dr. Marin Olp.   Recently diagnosed.  On chemotherapy. -Follow oncology recommendations.  Normocytic anemia: Hemoglobin 9.0.  (12.9 on 12/20).  -Continue trending CBC -Anemia panel -Continue PPI   DVT prophylaxis: SCD Code Status: Full code Family Communication: Wife at  bedside. Disposition Plan: Remains inpatient pending MRCP and GI input.  Still with significant hyperbilirubinemia.  Consultants:   Oncology  Gastroenterology  Interventional radiology  Procedures:   MRCP  ERCP  Antimicrobials:  None   Objective: Vitals:   03/15/18 1545 03/15/18 2038 03/16/18 0529 03/16/18 1444  BP: 128/70 130/72 128/71 129/80  Pulse: 73 74 63 72  Resp: _0 Temp:  98.3 F (36.8 C) 98.1 F (36.7 C) 98.5 F (36.9 C)  TempSrc:  Oral Oral Oral  SpO2: 96% 94% 98% 94%  Weight:      Height:        Intake/Output Summary (Last 24 hours) at 03/16/2018 1532 Last data filed at 03/16/2018 1433 Gross per 24 hour  Intake 2288.75 ml  Output -  Net 2288.75 ml   Filed Weights   03/13/18 2209 03/14/18 0544 03/15/18 0400  Weight: 97.4 kg 97.4 kg 94.6 kg    Exam:  . General: 63 y.o. year-old male well-developed well-nourished in no acute distress.  Alert oriented x3.  Jaundice with icteric sclera. . Cardiovascular: Regular rate and rhythm with no rubs or gallops.  No JVD or thyromegaly noted.   Marland Kitchen Respiratory: Clear to auscultation with no wheezes or rales.  Good inspiratory effort.. . Abdomen: Soft mild diffuse tenderness nondistended with hypoactive bowel sounds x4 quadrants. . Musculoskeletal: No lower extremity edema. 2/4 pulses in all 4 extremities. . Skin: No ulcerative lesions noted or rashes, jaundice. Marland Kitchen Psychiatry: Mood is appropriate for condition and setting   Data Reviewed: CBC: Recent Labs  Lab 03/13/18 1400 03/14/18 0500 03/14/18 0945 03/15/18 0558 03/16/18 0807  WBC 5.1 5.2 4.8 4.1 5.2  NEUTROABS 3.7  --   --   --   --  HGB 9.5* 9.0* 8.7* 8.7* 8.8*  HCT 29.1* 28.9* 28.0* 28.0* 28.7*  MCV 85.6 87.3 88.1 88.6 86.2  PLT 366 360 332 362 415*   Basic Metabolic Panel: Recent Labs  Lab 03/13/18 1500 03/13/18 2236 03/14/18 0500 03/14/18 0945 03/15/18 0558 03/16/18 0807  NA 133*  --  135  --  137 138  K 3.2*  --  3.5   --  3.7 3.6  CL 99  --  103  --  107 109  CO2 24  --  22  --  23 20*  GLUCOSE 113*  --  106*  --  87 107*  BUN 9  --  8  --  9 10  CREATININE 0.76  --  0.70 0.68 0.69 0.71  CALCIUM 8.5*  --  8.5*  --  8.5* 8.4*  MG  --  2.2 2.1  --   --   --    GFR: Estimated Creatinine Clearance: 111.4 mL/min (by C-G formula based on SCr of 0.71 mg/dL). Liver Function Tests: Recent Labs  Lab 03/13/18 1500 03/14/18 0500 03/15/18 0558 03/16/18 0807  AST 76* 85* 79* 89*  ALT 173* 164* 140* 133*  ALKPHOS 326* 269* 252* 267*  BILITOT 7.1* 7.8* 7.9* 8.5*  PROT 6.5 6.4* 5.9* 5.8*  ALBUMIN 3.4* 2.5* 2.5* 2.4*   No results for input(s): LIPASE, AMYLASE in the last 168 hours. No results for input(s): AMMONIA in the last 168 hours. Coagulation Profile: Recent Labs  Lab 03/16/18 0807  INR 1.31   Cardiac Enzymes: No results for input(s): CKTOTAL, CKMB, CKMBINDEX, TROPONINI in the last 168 hours. BNP (last 3 results) No results for input(s): PROBNP in the last 8760 hours. HbA1C: No results for input(s): HGBA1C in the last 72 hours. CBG: No results for input(s): GLUCAP in the last 168 hours. Lipid Profile: No results for input(s): CHOL, HDL, LDLCALC, TRIG, CHOLHDL, LDLDIRECT in the last 72 hours. Thyroid Function Tests: No results for input(s): TSH, T4TOTAL, FREET4, T3FREE, THYROIDAB in the last 72 hours. Anemia Panel: Recent Labs    03/14/18 0945 03/14/18 0951  VITAMINB12  --  956*  FOLATE  --  22.4  FERRITIN  --  969*  TIBC  --  174*  IRON  --  20*  RETICCTPCT 0.9  --    Urine analysis:    Component Value Date/Time   COLORURINE YELLOW 09/18/2015 0941   APPEARANCEUR CLEAR 09/18/2015 0941   LABSPEC 1.024 09/18/2015 0941   PHURINE 6.0 09/18/2015 0941   GLUCOSEU NEGATIVE 09/18/2015 0941   HGBUR NEGATIVE 09/18/2015 0941   BILIRUBINUR NEGATIVE 09/18/2015 0941   KETONESUR NEGATIVE 09/18/2015 0941   PROTEINUR NEGATIVE 09/18/2015 0941   NITRITE NEGATIVE 09/18/2015 0941   LEUKOCYTESUR  NEGATIVE 09/18/2015 0941   Sepsis Labs: _0 (procalcitonin:4,lacticidven:4)  )No results found for this or any previous visit (from the past 240 hour(s)).    Studies: No results found.  Scheduled Meds: . heparin injection (subcutaneous)  5,000 Units Subcutaneous Q8H  . pantoprazole (PROTONIX) IV  40 mg Intravenous Q12H  . senna-docusate  2 tablet Oral BID    Continuous Infusions: . sodium chloride 100 mL/hr at 03/16/18 1433  . ondansetron (ZOFRAN) IV       LOS: 3 days     Kayleen Memos, MD [facility] Pager [phone]  If 7PM-7AM, please contact night-coverage [email or web address] Password [facility] 03/16/2018, 3:32 PM

## 2018-03-17 ENCOUNTER — Encounter (HOSPITAL_COMMUNITY): Payer: Self-pay | Admitting: Interventional Radiology

## 2018-03-17 ENCOUNTER — Inpatient Hospital Stay (HOSPITAL_COMMUNITY): Payer: Medicare Other

## 2018-03-17 DIAGNOSIS — C165 Malignant neoplasm of lesser curvature of stomach, unspecified: Secondary | ICD-10-CM

## 2018-03-17 HISTORY — PX: IR BILIARY DRAIN PLACEMENT WITH CHOLANGIOGRAM: IMG6043

## 2018-03-17 LAB — COMPREHENSIVE METABOLIC PANEL
ALT: 138 U/L — ABNORMAL HIGH (ref 0–44)
AST: 97 U/L — ABNORMAL HIGH (ref 15–41)
Albumin: 2.5 g/dL — ABNORMAL LOW (ref 3.5–5.0)
Alkaline Phosphatase: 276 U/L — ABNORMAL HIGH (ref 38–126)
Anion gap: 10 (ref 5–15)
BUN: 7 mg/dL — ABNORMAL LOW (ref 8–23)
CO2: 22 mmol/L (ref 22–32)
Calcium: 8.3 mg/dL — ABNORMAL LOW (ref 8.9–10.3)
Chloride: 106 mmol/L (ref 98–111)
Creatinine, Ser: 0.64 mg/dL (ref 0.61–1.24)
Glucose, Bld: 106 mg/dL — ABNORMAL HIGH (ref 70–99)
Potassium: 3.4 mmol/L — ABNORMAL LOW (ref 3.5–5.1)
Sodium: 138 mmol/L (ref 135–145)
Total Bilirubin: 8.9 mg/dL — ABNORMAL HIGH (ref 0.3–1.2)
Total Protein: 5.8 g/dL — ABNORMAL LOW (ref 6.5–8.1)

## 2018-03-17 LAB — CBC
HCT: 28.8 % — ABNORMAL LOW (ref 39.0–52.0)
Hemoglobin: 8.9 g/dL — ABNORMAL LOW (ref 13.0–17.0)
MCH: 27.1 pg (ref 26.0–34.0)
MCHC: 30.9 g/dL (ref 30.0–36.0)
MCV: 87.5 fL (ref 80.0–100.0)
Platelets: 382 10*3/uL (ref 150–400)
RBC: 3.29 MIL/uL — ABNORMAL LOW (ref 4.22–5.81)
RDW: 15.3 % (ref 11.5–15.5)
WBC: 4.6 10*3/uL (ref 4.0–10.5)
nRBC: 0 % (ref 0.0–0.2)

## 2018-03-17 MED ORDER — ALUM & MAG HYDROXIDE-SIMETH 200-200-20 MG/5ML PO SUSP
30.0000 mL | Freq: Three times a day (TID) | ORAL | Status: DC
  Administered 2018-03-17 – 2018-03-24 (×19): 30 mL via ORAL
  Filled 2018-03-17 (×18): qty 30

## 2018-03-17 MED ORDER — IOPAMIDOL (ISOVUE-300) INJECTION 61%
100.0000 mL | Freq: Once | INTRAVENOUS | Status: AC | PRN
  Administered 2018-03-20: 100 mL

## 2018-03-17 MED ORDER — PANCRELIPASE (LIP-PROT-AMYL) 12000-38000 UNITS PO CPEP
36000.0000 [IU] | ORAL_CAPSULE | Freq: Three times a day (TID) | ORAL | Status: DC
  Administered 2018-03-17 – 2018-03-24 (×21): 36000 [IU] via ORAL
  Filled 2018-03-17 (×22): qty 3

## 2018-03-17 MED ORDER — MIDAZOLAM HCL 2 MG/2ML IJ SOLN
INTRAMUSCULAR | Status: AC
  Filled 2018-03-17: qty 4

## 2018-03-17 MED ORDER — MIDAZOLAM HCL 2 MG/2ML IJ SOLN
INTRAMUSCULAR | Status: AC | PRN
  Administered 2018-03-17: 1 mg via INTRAVENOUS
  Administered 2018-03-17: 0.5 mg via INTRAVENOUS
  Administered 2018-03-17: 1 mg via INTRAVENOUS
  Administered 2018-03-17: 0.5 mg via INTRAVENOUS
  Administered 2018-03-17: 1 mg via INTRAVENOUS
  Administered 2018-03-17 (×2): 0.5 mg via INTRAVENOUS

## 2018-03-17 MED ORDER — LIDOCAINE HCL 1 % IJ SOLN
INTRAMUSCULAR | Status: AC
  Filled 2018-03-17: qty 20

## 2018-03-17 MED ORDER — MIDAZOLAM HCL 2 MG/2ML IJ SOLN
INTRAMUSCULAR | Status: AC
  Filled 2018-03-17: qty 2

## 2018-03-17 MED ORDER — SODIUM CHLORIDE 0.9% FLUSH
5.0000 mL | Freq: Three times a day (TID) | INTRAVENOUS | Status: DC
  Administered 2018-03-17 – 2018-03-23 (×14): 5 mL

## 2018-03-17 MED ORDER — FENTANYL CITRATE (PF) 100 MCG/2ML IJ SOLN
INTRAMUSCULAR | Status: AC | PRN
  Administered 2018-03-17 (×2): 25 ug via INTRAVENOUS
  Administered 2018-03-17: 50 ug via INTRAVENOUS
  Administered 2018-03-17: 25 ug via INTRAVENOUS
  Administered 2018-03-17: 50 ug via INTRAVENOUS
  Administered 2018-03-17: 25 ug via INTRAVENOUS
  Administered 2018-03-17: 50 ug via INTRAVENOUS

## 2018-03-17 MED ORDER — FENTANYL CITRATE (PF) 100 MCG/2ML IJ SOLN
INTRAMUSCULAR | Status: AC
  Filled 2018-03-17: qty 2

## 2018-03-17 MED ORDER — POLYETHYLENE GLYCOL 3350 17 G PO PACK
17.0000 g | PACK | Freq: Two times a day (BID) | ORAL | Status: DC
  Administered 2018-03-17 – 2018-03-24 (×12): 17 g via ORAL
  Filled 2018-03-17 (×13): qty 1

## 2018-03-17 MED ORDER — FENTANYL CITRATE (PF) 100 MCG/2ML IJ SOLN
INTRAMUSCULAR | Status: AC
  Filled 2018-03-17: qty 4

## 2018-03-17 MED ORDER — FENTANYL CITRATE (PF) 100 MCG/2ML IJ SOLN
12.5000 ug | INTRAMUSCULAR | Status: DC | PRN
  Administered 2018-03-17 – 2018-03-18 (×7): 12.5 ug via INTRAVENOUS
  Filled 2018-03-17 (×7): qty 2

## 2018-03-17 MED ORDER — POTASSIUM CHLORIDE CRYS ER 20 MEQ PO TBCR
40.0000 meq | EXTENDED_RELEASE_TABLET | Freq: Once | ORAL | Status: AC
  Administered 2018-03-17: 40 meq via ORAL
  Filled 2018-03-17: qty 2

## 2018-03-17 MED ORDER — PIPERACILLIN-TAZOBACTAM 3.375 G IVPB 30 MIN
INTRAVENOUS | Status: AC | PRN
  Administered 2018-03-17: 3.375 g via INTRAVENOUS

## 2018-03-17 MED ORDER — IOHEXOL 300 MG/ML  SOLN
100.0000 mL | Freq: Once | INTRAMUSCULAR | Status: AC | PRN
  Administered 2018-03-17: 40 mL

## 2018-03-17 MED ORDER — PIPERACILLIN-TAZOBACTAM 3.375 G IVPB
INTRAVENOUS | Status: AC
  Filled 2018-03-17: qty 50

## 2018-03-17 MED ORDER — SIMETHICONE 80 MG PO CHEW
80.0000 mg | CHEWABLE_TABLET | Freq: Four times a day (QID) | ORAL | Status: DC | PRN
  Administered 2018-03-17 – 2018-03-19 (×4): 80 mg via ORAL
  Filled 2018-03-17 (×4): qty 1

## 2018-03-17 NOTE — Telephone Encounter (Signed)
Patient is doing well from his shoulder surgery.  He is released from care at this time.

## 2018-03-17 NOTE — Procedures (Signed)
Obstructive jaundice  S/p Rt int/ext biliary drain  No comp Stable EBL min Keep to gravity drain bag Full report in pacs

## 2018-03-17 NOTE — Telephone Encounter (Signed)
Letter done and in chart.   Joycelyn Schmid will you call patient's wife to see if she wants to pick up a copy or have it mailed?  Let me know, I can print it and do which ever, thanks so much.

## 2018-03-17 NOTE — Telephone Encounter (Signed)
Letter done for patient to send to [place]. See messages.

## 2018-03-17 NOTE — Progress Notes (Signed)
Steven Ortega is about the same.  It looks like he has extrinsic compression on the biliary duct.  Gastroenterology tried to place a stent over the weekend.  This cannot be done because of retained gastric contents.  Radiology will attempt to do a percutaneous drain today.  I think this is very reasonable.  We have to get his bilirubin down so that we can do his next treatment.  Treatment will be due on Thursday.  He is having a lot of "gas".  We had the MRI done, there was "moderate ascites."  I think we have to check this to make sure that there is no obvious metastatic disease.  I will try him on some Creon.  Maybe this might help with some of the gas that he has.  His labs today show a white cell count of 4.6.  Hemoglobin 8.9.  Platelet count 382,000.  His bilirubin is 8.9.  His SGPT is 138 SGOT 97.  His creatinine is 0.64.  He did get a dose of IV iron.  I am sure he will probably need another dose.  On his physical exam, his vital signs show temperature of 98.3.  Pulse 57.  Blood pressure 138/78.  Head neck exam shows no ocular or oral lesions.  There are no palpable cervical or supra clavicular lymph nodes.  Lungs are clear bilaterally.  Cardiac exam regular rate and rhythm with no murmurs, rubs or bruits.  Abdomen is soft.  There is some slight abdominal distention.  There is no guarding or rebound tenderness.  He has no obvious hepatomegaly.  Extremities shows no clubbing, cyanosis or edema.  Steven Ortega is a 63 year old white male with locally advanced gastric cancer.  He has biliary obstruction by tumor.  Hopefully this can be improved by a percutaneous stent.  We will find out soon.  I appreciate everybody's help with this.  We will have to watch his anemia.  I may also want to think about transfusing him if we cannot get his hemoglobin up.  Again, his next cycle is due on Thursday.  I really want to try to keep him on schedule if possible.  I really want to be able to treat him so that  we can keep working toward getting his cancer resected.  Lattie Haw, MD  [facility]alm [phone]

## 2018-03-17 NOTE — Progress Notes (Signed)
PROGRESS NOTE  Steven Ortega EYC:[id number] DOB: 1955/05/28 DOA: 03/13/2018 PCP: Christain Sacramento, MD  HPI/Recap of past 42 hours: 63 year old male with history of stage II stoma cancer (recent diagnosis), chronic iron deficiency anemia and hypothyroidism admitted with hyperbilirubinemia to 7.1.  Followed by oncology for stomach cancer.  03/15/2018: Patient seen and examined with his wife at bedside.  No acute events overnight.  Reports significant abdominal pain prior to taking his pain medication.  ERCP planned today.  03/16/2018: Seen and examined at his bedside, wife is present.  Significant abdominal pain which is improved with IV morphine.  Plan for image guided percutaneous biliary drain tomorrow by interventional radiology.  ERCP 111 unsuccessful due to retained gastric contents.  Unable to clear gastric debris so ERCP not attempted.  Avoid fiber per GI.  N.p.o. after midnight.  03/17/2018: Patient seen and examined with his wife at bedside.  No new complaints.  No acute events overnight.  Plan for right biliary drain by interventional radiology.  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    -Continue to trend  Stage II stomach cancer:  Followed by oncology Dr. Marin Olp.   Recently diagnosed.  On chemotherapy. -Follow oncology recommendations.  Normocytic anemia: Hemoglobin 9.0.  (12.9 on 12/20).  -Continue trending CBC -Anemia panel -Continue PPI  Hypokalemia Potassium  3.4 Repleted with p.o. potassium 40 mEq x 1 dose Repeat BMP in the morning   DVT prophylaxis: SCD Code Status: Full code Family Communication: Wife at bedside. Disposition Plan: Home with home health RN possibly tomorrow or when GI, interventional radiology and oncology signed off.   Consultants:   Oncology  Gastroenterology  Interventional radiology  Procedures:   MRCP  ERCP  Antimicrobials:  None   Objective: Vitals:   03/17/18 1135 03/17/18 1140 03/17/18 1209 03/17/18 1342  BP: (!) 169/92 (!) 161/96 (!) 149/74 (!) 161/87  Pulse: 69 70 68 61  Resp: _0 Temp:   98.9 F (37.2 C) 98.3 F (36.8 C)  TempSrc:   Oral Oral  SpO2: 96% 97% 95% 96%  Weight:      Height:       No intake or output data in the 24 hours ending 03/17/18 1455 Filed Weights   03/14/18 0544 03/15/18 0400 03/17/18 0550  Weight: 97.4 kg 94.6 kg 95 kg    Exam:  . General: 63 y.o. year-old male well-developed well-nourished in no acute distress.  Alert oriented x3.  Jaundiced with icteric sclera . Cardiovascular: Regular rate and rhythm with no rubs or gallops.  No JVD or thyromegaly noted . Respiratory: Clear to auscultation with no wheezes or rales.  Good inspiratory effort. . Abdomen: Soft mild diffuse tenderness nondistended with hypoactive bowel sounds x4 quadrants. . Musculoskeletal: No lower extremity edema. 2/4 pulses in all 4 extremities. . Skin: No ulcerative lesions noted or rashes, jaundice. Marland Kitchen Psychiatry: Mood is appropriate for condition and setting   Data Reviewed: CBC: Recent Labs  Lab 03/13/18 1400 03/14/18 0500 03/14/18 0945 03/15/18 0558 03/16/18 0807 03/17/18 0556  WBC 5.1  5.2 4.8 4.1 5.2 4.6  NEUTROABS 3.7  --   --   --   --   --   HGB 9.5* 9.0* 8.7* 8.7* 8.8* 8.9*  HCT 29.1* 28.9* 28.0* 28.0* 28.7* 28.8*  MCV 85.6 87.3 88.1 88.6 86.2 87.5  PLT 366 360 332 362 432* 161   Basic Metabolic Panel: Recent Labs  Lab 03/13/18 1500 03/13/18 2236  03/14/18 0500 03/14/18 0945 03/15/18 0558 03/16/18 0807 03/17/18 0556  NA 133*  --  135  --  137 138 138  K 3.2*  --  3.5  --  3.7 3.6 3.4*  CL 99  --  103  --  107 109 106  CO2 24  --  22  --  23 20* 22  GLUCOSE 113*  --  106*  --  87 107* 106*  BUN 9  --  8  --  9 10 7*  CREATININE 0.76  --  0.70 0.68 0.69 0.71 0.64  CALCIUM 8.5*  --  8.5*  --  8.5* 8.4* 8.3*  MG  --  2.2 2.1  --   --   --   --    GFR: Estimated Creatinine Clearance: 111.6 mL/min (by C-G formula based on SCr of 0.64 mg/dL). Liver Function Tests: Recent Labs  Lab 03/13/18 1500 03/14/18 0500 03/15/18 0558 03/16/18 0807 03/17/18 0556  AST 76* 85* 79* 89* 97*  ALT 173* 164* 140* 133* 138*  ALKPHOS 326* 269* 252* 267* 276*  BILITOT 7.1* 7.8* 7.9* 8.5* 8.9*  PROT 6.5 6.4* 5.9* 5.8* 5.8*  ALBUMIN 3.4* 2.5* 2.5* 2.4* 2.5*   No results for input(s): LIPASE, AMYLASE in the last 168 hours. No results for input(s): AMMONIA in the last 168 hours. Coagulation Profile: Recent Labs  Lab 03/16/18 0807  INR 1.31   Cardiac Enzymes: No results for input(s): CKTOTAL, CKMB, CKMBINDEX, TROPONINI in the last 168 hours. BNP (last 3 results) No results for input(s): PROBNP in the last 8760 hours. HbA1C: No results for input(s): HGBA1C in the last 72 hours. CBG: No results for input(s): GLUCAP in the last 168 hours. Lipid Profile: No results for input(s): CHOL, HDL, LDLCALC, TRIG, CHOLHDL, LDLDIRECT in the last 72 hours. Thyroid Function Tests: No results for input(s): TSH, T4TOTAL, FREET4, T3FREE, THYROIDAB in the last 72 hours. Anemia Panel: No results for input(s): VITAMINB12, FOLATE, FERRITIN, TIBC, IRON, RETICCTPCT in the last 72 hours. Urine analysis:    Component Value Date/Time   COLORURINE YELLOW 09/18/2015 0941   APPEARANCEUR CLEAR 09/18/2015 0941   LABSPEC 1.024 09/18/2015 0941   PHURINE 6.0 09/18/2015 0941   GLUCOSEU NEGATIVE 09/18/2015 0941   HGBUR NEGATIVE 09/18/2015 0941   BILIRUBINUR NEGATIVE  09/18/2015 0941   KETONESUR NEGATIVE 09/18/2015 0941   PROTEINUR NEGATIVE 09/18/2015 0941   NITRITE NEGATIVE 09/18/2015 0941   LEUKOCYTESUR NEGATIVE 09/18/2015 0941   Sepsis Labs: _0 (procalcitonin:4,lacticidven:4)  )No results found for this or any previous visit (from the past 240 hour(s)).    Studies: Ir Biliary Drain Placement With Cholangiogram  Result Date: 03/17/2018 INDICATION: Gastric cancer, obstructive jaundice, failed ERCP EXAM: ULTRASOUND AND FLUOROSCOPIC RIGHT INTERNAL EXTERNAL 10 FRENCH BILIARY DRAIN MEDICATIONS: 3.375 G ZOSYN; The antibiotic was administered within an appropriate time frame prior to the initiation of the procedure. ANESTHESIA/SEDATION: Moderate (conscious) sedation was employed during this procedure. A total of Versed 6.0 mg and Fentanyl 300 mcg was administered intravenously. Moderate Sedation Time: 43 minutes. The patient's level of consciousness and vital signs were monitored continuously by  radiology nursing throughout the procedure under my direct supervision. FLUOROSCOPY TIME:  Fluoroscopy Time: 5 minutes 48 seconds (124 mGy). COMPLICATIONS: None immediate. PROCEDURE: Informed written consent was obtained from the patient after a thorough discussion of the procedural risks, benefits and alternatives. All questions were addressed. Maximal Sterile Barrier Technique was utilized including caps, mask, sterile gowns, sterile gloves, sterile drape, hand hygiene and skin antiseptic. A timeout was performed prior to the initiation of the procedure. Previous imaging reviewed. Preliminary ultrasound performed. The right hepatic lobe was localized in the mid axillary line through a lower intercostal space. Overlying skin marked. 21 gauge access needle was advanced into the liver under ultrasound. Approximately 5 passes made through the liver. Eventually a peripheral hepatic duct was opacified. Limited cholangiogram performed. Proximal CBD obstruction confirmed.  Images obtained for documentation. 21 gauge needle adjusted to access the peripheral right hepatic duct more securely. This was done under rotational fluoroscopy. 018 guidewire advanced into the bile duct system easily. Accustick dilator exchange performed. Amplatz guidewire advanced into the duodenum. Tract dilatation performed to insert a 10 [place] internal external biliary drain. Retention loop formed in the duodenum. Contrast injection confirms position and adequate biliary drainage through the proximal CBD obstruction. Catheter secured with Prolene suture and connected to external gravity drainage. Sterile dressing applied. No immediate complication. Patient tolerated the procedure well. IMPRESSION: Successful ultrasound and fluoroscopic right 10 French internal external biliary drain. Electronically Signed   By: Jerilynn Mages.  Shick M.D.   On: 03/17/2018 12:19    Scheduled Meds: . alum & mag hydroxide-simeth  30 mL Oral Q8H  . fentaNYL      . heparin injection (subcutaneous)  5,000 Units Subcutaneous Q8H  . lidocaine      . lipase/protease/amylase  36,000 Units Oral TID AC  . midazolam      . pantoprazole (PROTONIX) IV  40 mg Intravenous Q12H  . polyethylene glycol  17 g Oral BID  . senna-docusate  2 tablet Oral BID  . sodium chloride flush  5 mL Intracatheter Q8H    Continuous Infusions: . sodium chloride 100 mL/hr at 03/17/18 0010  . ondansetron (ZOFRAN) IV    . piperacillin-tazobactam       LOS: 4 days     Kayleen Memos, MD [facility] Pager [phone]  If 7PM-7AM, please contact night-coverage [email or web address] Password [facility] 03/17/2018, 2:55 PM

## 2018-03-17 NOTE — Care Management Important Message (Signed)
Important Message  Patient Details  Name: Steven Ortega MRN: [id number] Date of Birth: Jul 30, 1955   Medicare Important Message Given:  Yes    Kerin Salen 03/17/2018, 10:44 AMImportant Message  Patient Details  Name: Steven Ortega MRN: [id number] Date of Birth: [birth year]   Medicare Important Message Given:  Yes    Kerin Salen 03/17/2018, 10:44 AM

## 2018-03-17 NOTE — Progress Notes (Signed)
[facility] Progress Note    Since last GI note: For IR percutantenous biliary drain today.  He is eager to proceed so that he can continue chemotherapy  Objective: Vital signs in last 24 hours: Temp:  [98.3 F (36.8 C)-98.7 F (37.1 C)] 98.3 F (36.8 C) (01/13 0550) Pulse Rate:  [57-72] 57 (01/13 0550) Resp:  [17-18] 17 (01/13 0550) BP: (129-138)/(77-80) 138/78 (01/13 0550) SpO2:  [94 %-97 %] 97 % (01/13 0550) Weight:  [95 kg] 95 kg (01/13 0550) Last BM Date: 03/16/18 General: alert and oriented times 3 Heart: regular rate and rythm Abdomen: soft, non-tender, non-distended, normal bowel sounds   Lab Results: Recent Labs    03/15/18 0558 03/16/18 0807 03/17/18 0556  WBC 4.1 5.2 4.6  HGB 8.7* 8.8* 8.9*  PLT 362 432* 382  MCV 88.6 86.2 87.5   Recent Labs    03/15/18 0558 03/16/18 0807 03/17/18 0556  NA 137 138 138  K 3.7 3.6 3.4*  CL 107 109 106  CO2 23 20* 22  GLUCOSE 87 107* 106*  BUN 9 10 7*  CREATININE 0.69 0.71 0.64  CALCIUM 8.5* 8.4* 8.3*   Recent Labs    03/15/18 0558 03/16/18 0807 03/17/18 0556  PROT 5.9* 5.8* 5.8*  ALBUMIN 2.5* 2.4* 2.5*  AST 79* 89* 97*  ALT 140* 133* 138*  ALKPHOS 252* 267* 276*  BILITOT 7.9* 8.5* 8.9*   Recent Labs    03/16/18 0807  INR 1.31     Medications: Scheduled Meds: . alum & mag hydroxide-simeth  30 mL Oral Q8H  . heparin injection (subcutaneous)  5,000 Units Subcutaneous Q8H  . lipase/protease/amylase  36,000 Units Oral TID AC  . pantoprazole (PROTONIX) IV  40 mg Intravenous Q12H  . polyethylene glycol  17 g Oral BID  . senna-docusate  2 tablet Oral BID   Continuous Infusions: . sodium chloride 100 mL/hr at 03/17/18 0010  . ondansetron (ZOFRAN) IV     PRN Meds:.LORazepam, morphine injection, ondansetron (ZOFRAN) IV **OR** ondansetron (ZOFRAN) IV    Assessment/Plan: 63 y.o. male with gastric adenocarcinoma (Stage IIA) that is causing biliary obstruction  IR biliary drain today so  that he can complete neoadjuvant chemo.  Hopefully he'll have eventual surgical resection.  Biliary drain can be left in place until surgery if he ever gets to that point.  He is probably developing gastric outlet obstruction from the cancer.  It is not a complete obstruction and hopefully further chemo will shrink the tumor so that he can eat without pain, nausea.  Full liquid diet for now and he should NOT advance.  Please call, page with any further questions or concerns.   Milus Banister, MD  03/17/2018, 7:23 AM [facility] Pager [phone]

## 2018-03-17 NOTE — Discharge Instructions (Signed)
Biliary Drainage Catheter Home Guide A biliary drainage catheter is a thin, flexible tube that is inserted through your skin into the bile ducts in your liver. Bile is a thick yellow or green fluid that helps digest fat in foods. The purpose of a biliary drainage catheter is to keep bile from backing up into your liver. Backup of bile can occur when there is a blockage that prevents bile from moving from the bile ducts into the small intestine as it should. The blockage can be caused by gallstones, a tumor, or scar tissue. There are three types of biliary drainage:  External biliary drainage. With this type, bile is only drained into a collection bag outside your body (external collection bag).  Internal-external biliary drainage. With this type, bile is drained to an external collection bag as well as into your small intestine.  Internal biliary drainage. With this type, bile is only drained into your small intestine. General home care includes these daily actions:   Inspection of your drainage catheter.  Flushing your drainage catheter with saline.  Emptying drainage from the collection bag (if present).  Recording the amount of drainage.  Checking the catheter insertion site for signs of infection. Check for: ? Redness, swelling, or pain. ? Fluid or blood. ? Warmth. ? Pus or a bad smell. How do I inspect my drainage catheter?  Check the dressing to make sure that it is dry and clean.  Look at the skin around the drainage catheter when changing the dressing for any problems such as redness, rash, or skin breakdown.  Check the drainage bag to make sure that drainage fluid is flowing into the bag well. Note the color and amount compared to other days.  Check the drainage catheter and bag for any cracks or kinks in the tubing. How do I change my dressing? The dressing over the drainage catheter should be changed every other day, or more often if needed to keep the dressing dry. Your  health care provider will instruct you about how often to change your dressing. Supplies needed:  Mild soap and warm water.  Split gauze pads, 4 x 4 inches (10 x 10 cm) to use as a dressing sponge.  Gauze pads, 4 x 4 inches (10 x 10 cm) or adhesive dressing cover.  Paper tape. How to change the dressing: 1. Wash your hands with soap and water. 2. Gently remove the old dressing. Avoid using scissors to remove the dressing because they may damage the drainage catheter. 3. Wash the skin around the insertion site with mild soap and warm water, rinse well, then pat the area dry with a clean cloth. 4. Check the skin around the drainage catheter for redness or swelling, or for yellow or green discharge that has a bad smell. 5. If the drainage catheter was stitched (sutured) to the skin, inspect the suture to make sure it is still anchored in the skin. 6. Do not apply creams, ointments, or alcohol to the site. Allow the skin to air-dry completely before you apply a new dressing. 7. Place the drainage catheter through the slit in a dressing sponge. The dressing sponge should slide under the disk that holds the drainage catheter in place. 8. Cover the drainage catheter and the dressing sponge with a 4 x 4 inch (10 x 10 cm) gauze. The drainage catheter should rest on the gauze and not on the skin. 9. Tape the dressing to the skin. 10. You may be instructed to use an  adhesive dressing covering over the top of this in place of the gauze and tape. °11. Wash your hands with soap and water. °How do I flush my drainage catheter? °Biliary drainagecatheters should be flushed daily, or as often as told by your health care provider. The end of the drainage catheter is closed using an IV cap. A syringe can be directly connected to the IV cap. °Supplies needed: °· Alcohol swab. °· 10 mL prefilled normal saline syringe. °How to flush the drainage catheter: °1. Wash your hands with soap and water. °2. If your drainage  catheter has a stopcock attached to it, turn the stopcock toward the drainage bag. This will allow the saline to flow in the direction of your body. °3. Clean the IV cap with an alcohol swab. °4. Screw the tip of a 10 mL normal saline syringe onto the IV cap. °5. Inject the saline over 5-10 seconds. If you feel resistance while injecting, stop immediately. Avoid  pulling back on the plunger. Doing that could increase your risk of infection. °6. Remove the syringe from the cap. Turn the stopcock so that fluid flows from your body into the drainage bag. You may notice more fluid flowing into the bag after you have completed the flush. °How do I attach a bag to my drainage catheter? °If you are having trouble with your internal biliary drain, you may be directed by your health care provider to use bag drainage until you can be seen to fix the problem. For this reason, you should always have a collection bag and connecting tubing at home. If you do not have these supplies, remember to ask for them at your next appointment. °1. Remove the bag and the connecting tubing from their packaging. °2. Connect the funnel end of the tubing to the bag's cone-shaped stem. °3. Remove the IV cap from the biliary drain. To do this, unscrew it and replace it with the screw-on end of the tubing. °4. Save the IV cap in a plastic storage bag that can be sealed. °How do I empty my collection bag? °Empty the collection bag whenever it becomes 2/3 full. Also empty it before you go to sleep. Most collection bags have a drainage valve at the bottom so the bag can be that allows them to be emptied easily. °1. Wash your hands with soap and water. °2. Hold the collection bag over the toilet, basin, or collection container. Use a measuring container if your health care provider told you to measure the drainage. °3. Unscrew the valve to open it, and allow the bag to drain. °4. Close the valve securely to avoid leakage. °5. Use a tissue or disposable  napkin to wipe the valve clean. °6. Wash the measuring container with soap and water. °7. Record the amount of drainage as told by your health care provider. °Contact a health care provider if: °· Your pain gets worse after it had improved, and it is not relieved with pain medicines. °· You have any questions about caring for your drainage catheter or collection bag. °· You have any of these around your catheter insertion site or coming from it: °? Skin breakdown. °? Redness, swelling, or pain. °? Fluid or blood. °? Warmth to the touch. °? Pus or a bad smell. °Get help right away if: °· You have a fever or chills. °· Your redness, swelling, or pain at the catheter insertion site gets worse, even though you are cleaning it well. °· You have leakage of   bile around the drainage catheter.  Your drainage catheter becomes blocked or clogged.  Your drainage catheter comes out. This information is not intended to replace advice given to you by your health care provider. Make sure you discuss any questions you have with your health care provider. Document Released: 12/10/2012 Document Revised: 01/09/2016 Document Reviewed: 01/09/2016 Elsevier Interactive Patient Education  2019 [place] un catter de drenaje biliar, cuidados posteriores Biliary Drainage Catheter Placement, Care After Lea esta informacin sobre cmo cuidarse despus del procedimiento. Su mdico tambin podr darle indicaciones ms especficas. Comunquese con su mdico si tiene problemas o preguntas. Qu puedo esperar despus del procedimiento? Despus del procedimiento, es comn [employer] siguientes sntomas:  Dolor o sensibilidad en el lugar de insercin del catter.  Cansancio y somnolencia durante varias horas.  Algunos hematomas en el lugar de insercin del catter.  Secrecin en la bolsa de recoleccin fuera del cuerpo, si tiene un catter de drenaje externo. ? [place], tal vez observe una  secrecin sanguinolenta en la bolsa. ? Luego, la secrecin debera ser [employer]. Siga estas indicaciones en su casa: Medicamentos   Tome medicamentos de venta libre o recetados para Conservation officer, historic buildings, la fiebre o Health and safety inspector, solamente como se lo haya indicado el mdico.  No tome aspirinas ni anticoagulantes a menos que se lo indique el mdico. Esto puede hacer que el sangrado empeore.  No conduzca ni use maquinaria pesada mientras toma analgsicos recetados. Cuidados del Environmental consultant de insercin del catter  Limpie el lugar de insercin del catter tal como se lo haya indicado el mdico.  No tome baos de inmersin, no nade ni use el jacuzzi hasta que el mdico lo autorice.  Tome duchas solamente. Antes de tomar [place] el rea de insercin del catter con una envoltura impermeable para mantener la zona seca.  Mantenga seca la piel que rodea el lugar de insercin del catter. Si la zona se moja, squela completamente.  Psychiatric nurse de la insercin todos los das para descartar signos de infeccin. Est atento a los siguientes signos: ? Dolor, hinchazn o enrojecimiento. ? Lquido o sangre. ? Calor. ? Pus o mal olor. Instrucciones generales   Haga reposo por el resto del da.  No conduzca automviles, no opere maquinarias pesadas, ni tome decisiones legales durante las primeras 24horas despus del procedimiento.  Siga con su dieta habitual. Evite las bebidas alcohlicas en las 24 horas posteriores al procedimiento.  Concurra a todas las visitas de control como se lo haya indicado el mdico. Esto es importante.  Beba suficiente lquido para Consulting civil engineer orina clara o de color amarillo plido. Comunquese con un mdico si:  El dolor empeora despus de una mejora inicial y no se alivia con analgsicos.  Tiene preguntas [employer] cuidados del catter de drenaje o la bolsa de Theatre stage manager.  Tiene alguno de los siguientes sntomas en el lugar de la insercin del  catter: ? Lesiones cutneas. ? Dolor, hinchazn o enrojecimiento. ? Lquido o sangre. ? Calor al tacto. ? Pus o mal olor. Solicite ayuda de inmediato si:  Tiene fiebre o siente escalofros.  El enrojecimiento, la hinchazn o Biochemist, clinical de insercin del catter Kathleen, aunque lo limpie bien.  Tiene filtraciones de bilis alrededor del catter de drenaje.  El catter de drenaje se obstruye.  El catter de Economist. Esta informacin no tiene Marine scientist el consejo del mdico. Asegrese de hacerle al mdico cualquier  pregunta que tenga. Document Released: 02/08/2011 Document Revised: 09/10/2016 Document Reviewed: 09/10/2016 Elsevier Interactive Patient Education  2019 [employer].

## 2018-03-18 ENCOUNTER — Encounter (HOSPITAL_COMMUNITY): Payer: Self-pay | Admitting: Internal Medicine

## 2018-03-18 LAB — CBC
HCT: 28.8 % — ABNORMAL LOW (ref 39.0–52.0)
Hemoglobin: 9.2 g/dL — ABNORMAL LOW (ref 13.0–17.0)
MCH: 27.5 pg (ref 26.0–34.0)
MCHC: 31.9 g/dL (ref 30.0–36.0)
MCV: 86.2 fL (ref 80.0–100.0)
Platelets: 411 10*3/uL — ABNORMAL HIGH (ref 150–400)
RBC: 3.34 MIL/uL — ABNORMAL LOW (ref 4.22–5.81)
RDW: 15.5 % (ref 11.5–15.5)
WBC: 5.5 10*3/uL (ref 4.0–10.5)
nRBC: 0 % (ref 0.0–0.2)

## 2018-03-18 LAB — COMPREHENSIVE METABOLIC PANEL
ALT: 146 U/L — ABNORMAL HIGH (ref 0–44)
AST: 103 U/L — ABNORMAL HIGH (ref 15–41)
Albumin: 2.5 g/dL — ABNORMAL LOW (ref 3.5–5.0)
Alkaline Phosphatase: 290 U/L — ABNORMAL HIGH (ref 38–126)
Anion gap: 11 (ref 5–15)
BUN: 7 mg/dL — AB (ref 8–23)
CO2: 23 mmol/L (ref 22–32)
Calcium: 8.3 mg/dL — ABNORMAL LOW (ref 8.9–10.3)
Chloride: 102 mmol/L (ref 98–111)
Creatinine, Ser: 0.55 mg/dL — ABNORMAL LOW (ref 0.61–1.24)
GFR calc Af Amer: >60 mL/min (ref >60)
GFR calc non Af Amer: >60 mL/min (ref >60)
Glucose, Bld: 106 mg/dL — ABNORMAL HIGH (ref 70–99)
Potassium: 3.3 mmol/L — ABNORMAL LOW (ref 3.5–5.1)
Sodium: 136 mmol/L (ref 135–145)
Total Bilirubin: 10 mg/dL — ABNORMAL HIGH (ref 0.3–1.2)
Total Protein: 6 g/dL — ABNORMAL LOW (ref 6.5–8.1)

## 2018-03-18 MED ORDER — MAGNESIUM SULFATE 2 GM/50ML IV SOLN
2.0000 g | Freq: Once | INTRAVENOUS | Status: AC
  Administered 2018-03-18: 2 g via INTRAVENOUS
  Filled 2018-03-18: qty 50

## 2018-03-18 MED ORDER — MORPHINE SULFATE (PF) 2 MG/ML IV SOLN
2.0000 mg | INTRAVENOUS | Status: DC | PRN
  Administered 2018-03-18 – 2018-03-19 (×4): 2 mg via INTRAVENOUS
  Filled 2018-03-18 (×4): qty 1

## 2018-03-18 MED ORDER — PANTOPRAZOLE SODIUM 40 MG PO TBEC
40.0000 mg | DELAYED_RELEASE_TABLET | Freq: Two times a day (BID) | ORAL | Status: DC
  Administered 2018-03-18 – 2018-03-24 (×12): 40 mg via ORAL
  Filled 2018-03-18 (×12): qty 1

## 2018-03-18 MED ORDER — DEXAMETHASONE SODIUM PHOSPHATE 4 MG/ML IJ SOLN
20.0000 mg | Freq: Once | INTRAMUSCULAR | Status: AC
  Administered 2018-03-18: 20 mg via INTRAVENOUS
  Filled 2018-03-18 (×2): qty 5

## 2018-03-18 MED ORDER — MORPHINE SULFATE (PF) 4 MG/ML IV SOLN
4.0000 mg | INTRAVENOUS | Status: DC | PRN
  Administered 2018-03-18 (×3): 4 mg via INTRAVENOUS
  Filled 2018-03-18 (×3): qty 1

## 2018-03-18 MED ORDER — MORPHINE SULFATE (PF) 2 MG/ML IV SOLN: 2.0000 mg | INTRAVENOUS | Status: DC | PRN

## 2018-03-18 MED ORDER — POTASSIUM CHLORIDE CRYS ER 20 MEQ PO TBCR
40.0000 meq | EXTENDED_RELEASE_TABLET | Freq: Once | ORAL | Status: AC
  Administered 2018-03-18: 40 meq via ORAL
  Filled 2018-03-18 (×2): qty 2

## 2018-03-18 MED ORDER — POTASSIUM CHLORIDE CRYS ER 20 MEQ PO TBCR
20.0000 meq | EXTENDED_RELEASE_TABLET | Freq: Once | ORAL | Status: AC
  Administered 2018-03-18: 20 meq via ORAL
  Filled 2018-03-18: qty 1

## 2018-03-18 MED ORDER — HYDROMORPHONE HCL 2 MG PO TABS
2.0000 mg | ORAL_TABLET | ORAL | Status: DC | PRN
  Administered 2018-03-19 (×2): 2 mg via ORAL
  Filled 2018-03-18 (×3): qty 1

## 2018-03-18 MED ORDER — FENTANYL CITRATE (PF) 100 MCG/2ML IJ SOLN
50.0000 ug | INTRAMUSCULAR | Status: DC | PRN
  Administered 2018-03-18: 50 ug via INTRAVENOUS
  Filled 2018-03-18: qty 2

## 2018-03-18 MED ORDER — HYDROMORPHONE HCL 2 MG PO TABS: 2.0000 mg | ORAL_TABLET | ORAL | Status: DC | PRN

## 2018-03-18 MED ORDER — MORPHINE SULFATE (PF) 4 MG/ML IV SOLN
4.0000 mg | Freq: Once | INTRAVENOUS | Status: AC
  Administered 2018-03-18: 4 mg via INTRAVENOUS
  Filled 2018-03-18: qty 1

## 2018-03-18 NOTE — Progress Notes (Signed)
Pharmacy IV to PO conversion  The patient is receiving Pantoprazole by the intravenous route.  Based on criteria approved by the Pharmacy and [place], the medication is being converted to the equivalent oral dose form.   No active GI bleeding or impaired absorption  Not s/p esophagectomy  Documented ability to take oral medications for > 24 hr  Plan to continue treatment for at least 1 day  If you have any questions about this conversion, please contact the Pharmacy Department (ext [phone]).  Thank you.  Reuel Boom, PharmD, BCPS [phone] 03/18/2018, 1:27 PM

## 2018-03-18 NOTE — Progress Notes (Signed)
Steven Ortega had his biliary stent placed yesterday.  Radiology did this.  He has an external drainage catheter in.  He is having quite a bit of pain.  We will adjust his morphine dose a little bit.  I will give him a dose of Decadron to try to help with any inflammation.  His bilirubin is 10.  Hopefully, this will start to come down.  His liver function studies are elevated.  Again I think this goes along with his bilirubin being up.  He is not eating all that much.  His CBC showed a white count of 5.5.  Hemoglobin 9.2 and platelet count 411,000.  There is no real change on his physical exam.  His abdomen shows good bowel sounds.  He is a little tender throughout the abdomen.  He has some slight distention.  Hopefully, he will have the ultrasound paracentesis today.  We will continue to follow along.  We really cannot treat him with chemotherapy till his bilirubin improves.  Hopefully will start seeing improvement tomorrow.   Lattie Haw, MD  Psalm 91:1

## 2018-03-18 NOTE — Progress Notes (Signed)
PROGRESS NOTE  Steven Ortega OPF:[id number] DOB: 1955/05/02 DOA: 03/13/2018 PCP: Christain Sacramento, MD  HPI/Recap of past 37 hours: 63 year old male with history of stage II stoma cancer (recent diagnosis), chronic iron deficiency anemia and hypothyroidism admitted with hyperbilirubinemia to 7.1.  Followed by oncology for stomach cancer.  Hospital course complicated by unsuccessful ERCP due to retained gastric contents.  Post biliary stent placement on 03/17/2018 by interventional radiology.  03/18/2018: Patient seen and examined at bedside.  No acute events overnight.  Reports significant pain in his right upper quadrant at site of biliary stent.  Pain medications adjusted to improve his symptoms.   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)   Hypokalemia   Hypothyroidism   Obstructive jaundice  Acute conjugated hyperbilirubinemia secondary to biliary obstruction from known gastric tumor post biliary stent placement on 03/17/2018 by interventional radiology:  Continue IV fluid hydration Adjust pain medications Total bilirubin continues to trend up to 10.0 Worsening transaminitis Repeat levels in the morning  Severe right upper quadrant abdominal pain secondary to above Currently on IV morphine 2 mg every 3 hours as needed for severe pain and oral Dilaudid 2 mg every 4 hours as needed for moderate pain Also received Decadron 20 mg IV once by oncology Continue bowel regimen MiraLAX twice daily  Hyperbilirubinemia Total bilirubin trending up from 8.9-10 Repeat levels in the morning  Jaundice in the setting of hyperbilirubinemia Management as stated above  Worsening transaminitis Elevated ALT/AST/alk phos: Likely due to the above.    -Continue to trend up Repeat levels in the morning  Stage II stomach cancer:  Followed by oncology Dr. Marin Olp.   Recently diagnosed.  On chemotherapy. -Follow  oncology recommendations.  Normocytic anemia: Hemoglobin 9.0.  (12.9 on 12/20).  -Continue trending CBC -Anemia panel -Continue PPI  Hypokalemia Potassium 3.3 Repleted with KCl 60 mEq and 2 g of IV magnesium Repeat BMP in the morning   DVT prophylaxis: SCD Code Status: Full code Family Communication: Wife at bedside. Disposition Plan: Home with home health RN possibly tomorrow or when GI, interventional radiology and oncology signed off.   Consultants:   Oncology  Gastroenterology  Interventional radiology  Procedures:   MRCP  ERCP  Antimicrobials:  None   Objective: Vitals:   03/17/18 2023 03/18/18 0405 03/18/18 1038 03/18/18 1411  BP: (!) 152/86 126/66  (!) 146/81  Pulse: 60 63  67  Resp: _0 Temp: 98.2 F (36.8 C) 98.3 F (36.8 C) 99.8 F (37.7 C) 99.7 F (37.6 C)  TempSrc: Oral Oral  Oral  SpO2: 94% 98%  94%  Weight:  94.6 kg    Height:        Intake/Output Summary (Last 24 hours) at 03/18/2018 1542 Last data filed at 03/18/2018 1441 Gross per 24 hour  Intake 605 ml  Output 131 ml  Net 474 ml   Filed Weights   03/15/18 0400 03/17/18 0550 03/18/18 0405  Weight: 94.6 kg 95 kg 94.6 kg    Exam:  . General: 63 y.o. year-old male developed well-nourished appears uncomfortable due to right upper quadrant pain.  Alert oriented x3.   . Cardiovascular: Regular rate and rhythm with no rubs or gallops.  No JVD or thyromegaly . Respiratory: Clear to Auscultation with No Wheezes or Rales.  Good Inspiratory Effort. . Abdomen: Soft tenderness on palpation right upper quadrant.  Nondistended.  Positive bowel sounds.   . Musculoskeletal:  No lower extremity edema. 2/4 pulses in all 4 extremities. . Skin: No ulcerative lesions noted or rashes, jaundice. Marland Kitchen Psychiatry: Mood is appropriate for condition and setting   Data Reviewed: CBC: Recent Labs  Lab 03/13/18 1400  03/14/18 0945 03/15/18 0558 03/16/18 0807 03/17/18 0556 03/18/18 0513    WBC 5.1   < > 4.8 4.1 5.2 4.6 5.5  NEUTROABS 3.7  --   --   --   --   --   --   HGB 9.5*   < > 8.7* 8.7* 8.8* 8.9* 9.2*  HCT 29.1*   < > 28.0* 28.0* 28.7* 28.8* 28.8*  MCV 85.6   < > 88.1 88.6 86.2 87.5 86.2  PLT 366   < > 332 362 432* 382 411*   < > = values in this interval not displayed.   Basic Metabolic Panel: Recent Labs  Lab 03/13/18 2236  03/14/18 0500 03/14/18 0945 03/15/18 0558 03/16/18 0807 03/17/18 0556 03/18/18 0513  NA  --   --  135  --  137 138 138 136  K  --   --  3.5  --  3.7 3.6 3.4* 3.3*  CL  --   --  103  --  107 109 106 102  CO2  --   --  22  --  23 20* 22 23  GLUCOSE  --   --  106*  --  87 107* 106* 106*  BUN  --   --  8  --  9 10 7* 7*  CREATININE  --    < > 0.70 0.68 0.69 0.71 0.64 0.55*  CALCIUM  --   --  8.5*  --  8.5* 8.4* 8.3* 8.3*  MG 2.2  --  2.1  --   --   --   --   --    < > = values in this interval not displayed.   GFR: Estimated Creatinine Clearance: 111.4 mL/min (A) (by C-G formula based on SCr of 0.55 mg/dL (L)). Liver Function Tests: Recent Labs  Lab 03/14/18 0500 03/15/18 0558 03/16/18 0807 03/17/18 0556 03/18/18 0513  AST 85* 79* 89* 97* 103*  ALT 164* 140* 133* 138* 146*  ALKPHOS 269* 252* 267* 276* 290*  BILITOT 7.8* 7.9* 8.5* 8.9* 10.0*  PROT 6.4* 5.9* 5.8* 5.8* 6.0*  ALBUMIN 2.5* 2.5* 2.4* 2.5* 2.5*   No results for input(s): LIPASE, AMYLASE in the last 168 hours. No results for input(s): AMMONIA in the last 168 hours. Coagulation Profile: Recent Labs  Lab 03/16/18 0807  INR 1.31   Cardiac Enzymes: No results for input(s): CKTOTAL, CKMB, CKMBINDEX, TROPONINI in the last 168 hours. BNP (last 3 results) No results for input(s): PROBNP in the last 8760 hours. HbA1C: No results for input(s): HGBA1C in the last 72 hours. CBG: No results for input(s): GLUCAP in the last 168 hours. Lipid Profile: No results for input(s): CHOL, HDL, LDLCALC, TRIG, CHOLHDL, LDLDIRECT in the last 72 hours. Thyroid Function Tests: No  results for input(s): TSH, T4TOTAL, FREET4, T3FREE, THYROIDAB in the last 72 hours. Anemia Panel: No results for input(s): VITAMINB12, FOLATE, FERRITIN, TIBC, IRON, RETICCTPCT in the last 72 hours. Urine analysis:    Component Value Date/Time   COLORURINE YELLOW 09/18/2015 [place] 09/18/2015 0941   LABSPEC 1.024 09/18/2015 0941   PHURINE 6.0 09/18/2015 0941   GLUCOSEU NEGATIVE 09/18/2015 0941   HGBUR NEGATIVE 09/18/2015 [place] NEGATIVE 09/18/2015 0941   KETONESUR NEGATIVE 09/18/2015  [place] 09/18/2015 0941   NITRITE NEGATIVE 09/18/2015 0941   LEUKOCYTESUR NEGATIVE 09/18/2015 0941   Sepsis Labs: _0 (procalcitonin:4,lacticidven:4)  )No results found for this or any previous visit (from the past 240 hour(s)).    Studies: No results found.  Scheduled Meds: . alum & mag hydroxide-simeth  30 mL Oral Q8H  . heparin injection (subcutaneous)  5,000 Units Subcutaneous Q8H  . lipase/protease/amylase  36,000 Units Oral TID AC  . pantoprazole  40 mg Oral BID  . polyethylene glycol  17 g Oral BID  . senna-docusate  2 tablet Oral BID  . sodium chloride flush  5 mL Intracatheter Q8H    Continuous Infusions: . sodium chloride 100 mL/hr at 03/18/18 0517  . ondansetron (ZOFRAN) IV       LOS: 5 days     Kayleen Memos, MD [facility] Pager [phone]  If 7PM-7AM, please contact night-coverage [email or web address] Password [facility] 03/18/2018, 3:42 PM

## 2018-03-18 NOTE — Telephone Encounter (Signed)
Mailed letter to Steven Ortega per request Thanks

## 2018-03-19 ENCOUNTER — Inpatient Hospital Stay (HOSPITAL_COMMUNITY): Payer: Medicare Other

## 2018-03-19 ENCOUNTER — Other Ambulatory Visit: Payer: Self-pay

## 2018-03-19 LAB — COMPREHENSIVE METABOLIC PANEL
ALT: 126 U/L — ABNORMAL HIGH (ref 0–44)
AST: 81 U/L — ABNORMAL HIGH (ref 15–41)
Albumin: 2.5 g/dL — ABNORMAL LOW (ref 3.5–5.0)
Alkaline Phosphatase: 286 U/L — ABNORMAL HIGH (ref 38–126)
Anion gap: 10 (ref 5–15)
BUN: 8 mg/dL (ref 8–23)
CO2: 25 mmol/L (ref 22–32)
Calcium: 8.3 mg/dL — ABNORMAL LOW (ref 8.9–10.3)
Chloride: 101 mmol/L (ref 98–111)
Creatinine, Ser: 0.6 mg/dL — ABNORMAL LOW (ref 0.61–1.24)
GFR calc Af Amer: >60 mL/min (ref >60)
GFR calc non Af Amer: >60 mL/min (ref >60)
GLUCOSE: 124 mg/dL — AB (ref 70–99)
Potassium: 3.7 mmol/L (ref 3.5–5.1)
Sodium: 136 mmol/L (ref 135–145)
Total Bilirubin: 10.5 mg/dL — ABNORMAL HIGH (ref 0.3–1.2)
Total Protein: 5.8 g/dL — ABNORMAL LOW (ref 6.5–8.1)

## 2018-03-19 LAB — CBC
HCT: 29.3 % — ABNORMAL LOW (ref 39.0–52.0)
Hemoglobin: 9.3 g/dL — ABNORMAL LOW (ref 13.0–17.0)
MCH: 26.8 pg (ref 26.0–34.0)
MCHC: 31.7 g/dL (ref 30.0–36.0)
MCV: 84.4 fL (ref 80.0–100.0)
NRBC: 0 % (ref 0.0–0.2)
Platelets: 390 10*3/uL (ref 150–400)
RBC: 3.47 MIL/uL — ABNORMAL LOW (ref 4.22–5.81)
RDW: 15.9 % — ABNORMAL HIGH (ref 11.5–15.5)
WBC: 6.6 10*3/uL (ref 4.0–10.5)

## 2018-03-19 LAB — TROPONIN I
Troponin I: <0.03 ng/mL (ref <0.03)
Troponin I: <0.03 ng/mL (ref <0.03)

## 2018-03-19 LAB — PROCALCITONIN: Procalcitonin: 0.27 ng/mL

## 2018-03-19 MED ORDER — ACETAMINOPHEN 325 MG PO TABS
650.0000 mg | ORAL_TABLET | Freq: Four times a day (QID) | ORAL | Status: DC | PRN
  Administered 2018-03-19 – 2018-03-20 (×2): 650 mg via ORAL
  Filled 2018-03-19 (×3): qty 2

## 2018-03-19 MED ORDER — ALPRAZOLAM 1 MG PO TABS
1.0000 mg | ORAL_TABLET | Freq: Every day | ORAL | Status: DC
  Administered 2018-03-19 – 2018-03-23 (×6): 1 mg via ORAL
  Filled 2018-03-19 (×6): qty 1

## 2018-03-19 MED ORDER — PIPERACILLIN-TAZOBACTAM 3.375 G IVPB 30 MIN: 3.3750 g | Freq: Four times a day (QID) | INTRAVENOUS | Status: DC

## 2018-03-19 MED ORDER — PIPERACILLIN-TAZOBACTAM 3.375 G IVPB
3.3750 g | Freq: Three times a day (TID) | INTRAVENOUS | Status: DC
  Administered 2018-03-19 – 2018-03-23 (×11): 3.375 g via INTRAVENOUS
  Filled 2018-03-19 (×9): qty 50

## 2018-03-19 MED ORDER — MORPHINE SULFATE (PF) 2 MG/ML IV SOLN
2.0000 mg | INTRAVENOUS | Status: DC | PRN
  Administered 2018-03-19: 2 mg via INTRAVENOUS
  Filled 2018-03-19: qty 1

## 2018-03-19 MED ORDER — MORPHINE SULFATE (PF) 4 MG/ML IV SOLN
4.0000 mg | INTRAVENOUS | Status: DC | PRN
  Administered 2018-03-19 – 2018-03-24 (×25): 4 mg via INTRAVENOUS
  Filled 2018-03-19 (×26): qty 1

## 2018-03-19 MED ORDER — CYCLOBENZAPRINE HCL 10 MG PO TABS
10.0000 mg | ORAL_TABLET | Freq: Every day | ORAL | Status: DC
  Administered 2018-03-19 – 2018-03-23 (×5): 10 mg via ORAL
  Filled 2018-03-19 (×5): qty 1

## 2018-03-19 MED ORDER — NITROGLYCERIN 0.4 MG SL SUBL: 0.4000 mg | SUBLINGUAL_TABLET | SUBLINGUAL | Status: DC | PRN

## 2018-03-19 NOTE — Progress Notes (Signed)
Supervising Physician: Markus Daft  Patient Status: [facility] - In-pt  Subjective: S/p PTC and I/E biliary drain 1/13. Still having some pain. Family at bedside  Objective: Physical Exam: BP (!) 146/77 (BP Location: Right Arm)   Pulse 65   Temp 99.1 F (37.3 C) (Oral)   Resp 18   Ht 6' (1.829 m)   Wt 94.6 kg   SpO2 96%   BMI 28.29 kg/m  RUQ drain intact, site clean, no evidence of retraction. Cloudy bilious output in bag. Drain flushed easily with about 7 mL NS, return of bilious fluid and some debris.   Current Facility-Administered Medications:  .  0.9 %  sodium chloride infusion, , Intravenous, Continuous, Ennever, Rudell Cobb, MD, Last Rate: 50 mL/hr at 03/19/18 0733 .  alum & mag hydroxide-simeth (MAALOX/MYLANTA) 200-200-20 MG/5ML suspension 30 mL, 30 mL, Oral, Q8H, Ennever, Rudell Cobb, MD, 30 mL at 03/19/18 0731 .  heparin injection 5,000 Units, 5,000 Units, Subcutaneous, Q8H, Gatha Mayer, MD, 5,000 Units at 03/19/18 1322 .  HYDROmorphone (DILAUDID) tablet 2 mg, 2 mg, Oral, Q4H PRN, Irene Pap N, DO, 2 mg at 03/19/18 1142 .  iopamidol (ISOVUE-300) 61 % injection 100 mL, 100 mL, Other, Once PRN, Greggory Keen, MD .  lipase/protease/amylase (CREON) capsule 36,000 Units, 36,000 Units, Oral, TID Zoila Shutter, MD, 36,000 Units at 03/19/18 1117 .  LORazepam (ATIVAN) injection 0.5 mg, 0.5 mg, Intravenous, Q6H PRN, Gatha Mayer, MD, 0.5 mg at 03/19/18 2119 .  morphine 2 MG/ML injection 2 mg, 2 mg, Intravenous, Q1H PRN, Volanda Napoleon, MD, 2 mg at 03/19/18 1347 .  ondansetron (ZOFRAN) 8 mg in sodium chloride 0.9 % 50 mL IVPB, 8 mg, Intravenous, Q6H PRN **OR** ondansetron (ZOFRAN) injection 4 mg, 4 mg, Intravenous, Q6H PRN, Gatha Mayer, MD, 4 mg at 03/19/18 1232 .  pantoprazole (PROTONIX) EC tablet 40 mg, 40 mg, Oral, BID, Polly Cobia, RPH, 40 mg at 03/19/18 1117 .  polyethylene glycol (MIRALAX / GLYCOLAX) packet 17 g, 17 g, Oral, BID, Volanda Napoleon, MD, 17 g  at 03/19/18 1117 .  senna-docusate (Senokot-S) tablet 2 tablet, 2 tablet, Oral, BID, Kayleen Memos, DO, 2 tablet at 03/19/18 1117 .  simethicone (MYLICON) chewable tablet 80 mg, 80 mg, Oral, Q6H PRN, Irene Pap N, DO, 80 mg at 03/19/18 1322 .  sodium chloride flush (NS) 0.9 % injection 5 mL, 5 mL, Intracatheter, Q8H, Greggory Keen, MD, 5 mL at 03/19/18 1322  Labs: CBC Recent Labs    03/18/18 0513 03/19/18 0544  WBC 5.5 6.6  HGB 9.2* 9.3*  HCT 28.8* 29.3*  PLT 411* 390   BMET Recent Labs    03/18/18 0513 03/19/18 0544  NA 136 136  K 3.3* 3.7  CL 102 101  CO2 23 25  GLUCOSE 106* 124*  BUN 7* 8  CREATININE 0.55* 0.60*  CALCIUM 8.3* 8.3*   LFT Recent Labs    03/19/18 0544  PROT 5.8*  ALBUMIN 2.5*  AST 81*  ALT 126*  ALKPHOS 286*  BILITOT 10.5*   PT/INR No results for input(s): LABPROT, INR in the last 72 hours.   Studies/Results: No results found.  Assessment/Plan: S/p PTC and I/E biliary drain 1/13. Bili trending up, now 10.5 Drain appears to be functioning well, bilious output. If Bili does not start coming down tomorrow, will bring back to IR for cholangiogram, possible exchange. Made NPO p MN in case sedation needed, pt states he's very  anxious   LOS: 6 days   I spent a total of 15 minutes in face to face in clinical consultation, greater than 50% of which was counseling/coordinating care for biliary obstruction  Ascencion Dike PA-C 03/19/2018 2:20 PM

## 2018-03-19 NOTE — Progress Notes (Signed)
Steven Ortega is still having a lot of pain.  This is from the biliary drain that was placed.  His bilirubin still is not come down.  It is 10.5 today.  His SGPT and SGOT are slightly better.  For some reason, his pain medication that I ordered yesterday was changed.  I will try to get him on a more suitable program that will help with his discomfort so he does not suffer.  I just am not able to do any treatment on him given the bilirubin of 10.5.  He is not really eating much.  Hopefully, his appetite will improve a little bit.  His labs show a white cell count of 6.6.  Hemoglobin 9.3.  Platelet count 390.  His bilirubin is 10.5.  SGPT 126 SGOT 81.  Alkaline phosphatase is 286.  Creatinine 0.6.  He is going try to get out of bed and walk today.  It certainly would be reasonable.  If, the bilirubin, is not going to improve, I probably would see about general surgery coming in to see if they can resect out the tumor.  Again, with his bilirubin at 10.5, we just not can be able to treat him as I would like.  He is having some swelling in his legs.  I will cut back on his IV fluids little bit.  There really is no change in his overall physical exam.  We will continue to follow along.  Lattie Haw, MD  Romans 8:14

## 2018-03-19 NOTE — Progress Notes (Signed)
PROGRESS NOTE    Steven Ortega  JAS:[id number] DOB: 1955/07/05 DOA: 03/13/2018 PCP: Christain Sacramento, MD   Brief Narrative:  63 year old with past medical history relevant for recent diagnosis of gastric cancer complicated by gastric outlet obstruction, hypothyroidism, BPH, pancreatic insufficiency, depression/anxiety who presented with extrinsic compression of biliary duct due to progression of gastric cancer status post failed ERCP and IR guided biliary placement on 03/17/2018.   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  His LFTs are somewhat downtrending. -We will discuss with interventional radiology about possible interrogation over replacing tube -We will discuss with general surgery about possible need for surgical resection -Oncology following, appreciate recommendations  #) Gastric cancer complicated by gastric outlet obstruction: Gastroenterology has seen the patient and feels that he should only be on a full liquid diet and not advance at this time until he gets therapy for the gastric cancer -Full liquid diet, to advance  #) Pancreatic insufficiency/slow motility: -Continue Creon with meals  #) Pain/psych: -Continue alprazolam 1 mg nightly -Continue bupropion 150 mg twice daily -Continue citalopram 40 mg daily -Continue PRN cyclobenzaprine  #) BPH: -Continue tamsulosin 0.4 mg daily  #) Hypothyroidism: -Continue levothyroxine 25 mcg every morning  Fluids: Tolerating p.o. Electrolytes: Monitor and supplement Nutrition: Full liquid diet  Prophylaxis: SCDs  Disposition: Pending resolution of biliary obstruction  Full code  Consultants:    Gastroenterology  Oncology, Dr. Marin Olp  Interventional radiology  Procedures:   03/15/2018 ERCP: Aborted due to significant stomach contents  03/17/2018 IR guided biliary drain placement  Antimicrobials:   None   Subjective: This morning patient continues to report significant pain in his right upper quadrant.  He continues to feel poorly.  He denies any nausea or vomiting but reports that he is only on a full liquid diet and is tolerating this fairly well.  Objective: Vitals:   03/18/18 0405 03/18/18 1038 03/18/18 1411 03/18/18 2058  BP: 126/66  (!) 146/81 (!) 146/77  Pulse: 63  67 65  Resp: 20  18 18   Temp: 98.3 F (36.8 C) 99.8 F (37.7 C) 99.7 F (37.6 C) 99.1 F (37.3 C)  TempSrc: Oral  Oral Oral  SpO2: 98%  94% 96%  Weight: 94.6 kg     Height:        Intake/Output Summary (Last 24 hours) at 03/19/2018 1207 Last data filed at 03/18/2018 2105 Gross per 24 hour  Intake 485 ml  Output 230 ml  Net 255 ml   Filed Weights   03/15/18 0400 03/17/18 0550 03/18/18 0405  Weight: 94.6 kg 95 kg 94.6 kg    Examination:  General exam: Appears calm and comfortable  Respiratory system: Clear to auscultation. Respiratory effort normal. Cardiovascular system: Regular rate and rhythm, no murmurs Gastrointestinal system: Soft, mildly tender in right upper quadrant, no rebound or guarding, plus bowel sounds. Central nervous system: Alert and oriented.  Grossly intact, moving all extremities Extremities: Trace lower extremity edema. Skin: Grossly icteric, percutaneous biliary drain site is clean dry and intact, port site is clean dry and intact Psychiatry: Judgement and insight appear normal. Mood & affect appropriate.     Data Reviewed: I have personally reviewed following labs and imaging studies  CBC: Recent  Labs  Lab 03/13/18 1400  03/15/18 0558 03/16/18 0807 03/17/18 0556 03/18/18 0513 03/19/18 0544  WBC 5.1   < > 4.1 5.2 4.6 5.5 6.6  NEUTROABS 3.7  --    --   --   --   --   --   HGB 9.5*   < > 8.7* 8.8* 8.9* 9.2* 9.3*  HCT 29.1*   < > 28.0* 28.7* 28.8* 28.8* 29.3*  MCV 85.6   < > 88.6 86.2 87.5 86.2 84.4  PLT 366   < > 362 432* 382 411* 390   < > = values in this interval not displayed.   Basic Metabolic Panel: Recent Labs  Lab 03/13/18 2236 03/14/18 0500  03/15/18 0558 03/16/18 0807 03/17/18 0556 03/18/18 0513 03/19/18 0544  NA  --  135  --  137 138 138 136 136  K  --  3.5  --  3.7 3.6 3.4* 3.3* 3.7  CL  --  103  --  107 109 106 102 101  CO2  --  22  --  23 20* 22 23 25   GLUCOSE  --  106*  --  87 107* 106* 106* 124*  BUN  --  8  --  9 10 7* 7* 8  CREATININE  --  0.70   < > 0.69 0.71 0.64 0.55* 0.60*  CALCIUM  --  8.5*  --  8.5* 8.4* 8.3* 8.3* 8.3*  MG 2.2 2.1  --   --   --   --   --   --    < > = values in this interval not displayed.   GFR: Estimated Creatinine Clearance: 111.4 mL/min (A) (by C-G formula based on SCr of 0.6 mg/dL (L)). Liver Function Tests: Recent Labs  Lab 03/15/18 0558 03/16/18 0807 03/17/18 0556 03/18/18 0513 03/19/18 0544  AST 79* 89* 97* 103* 81*  ALT 140* 133* 138* 146* 126*  ALKPHOS 252* 267* 276* 290* 286*  BILITOT 7.9* 8.5* 8.9* 10.0* 10.5*  PROT 5.9* 5.8* 5.8* 6.0* 5.8*  ALBUMIN 2.5* 2.4* 2.5* 2.5* 2.5*   No results for input(s): LIPASE, AMYLASE in the last 168 hours. No results for input(s): AMMONIA in the last 168 hours. Coagulation Profile: Recent Labs  Lab 03/16/18 0807  INR 1.31   Cardiac Enzymes: No results for input(s): CKTOTAL, CKMB, CKMBINDEX, TROPONINI in the last 168 hours. BNP (last 3 results) No results for input(s): PROBNP in the last 8760 hours. HbA1C: No results for input(s): HGBA1C in the last 72 hours. CBG: No results for input(s): GLUCAP in the last 168 hours. Lipid Profile: No results for input(s): CHOL, HDL, LDLCALC, TRIG, CHOLHDL, LDLDIRECT in the last 72 hours. Thyroid Function Tests: No results for input(s): TSH, T4TOTAL, FREET4, T3FREE, THYROIDAB in  the last 72 hours. Anemia Panel: No results for input(s): VITAMINB12, FOLATE, FERRITIN, TIBC, IRON, RETICCTPCT in the last 72 hours. Sepsis Labs: No results for input(s): PROCALCITON, LATICACIDVEN in the last 168 hours.  No results found for this or any previous visit (from the past 240 hour(s)).       Radiology Studies: No results found.      Scheduled Meds: . alum & mag hydroxide-simeth  30 mL Oral Q8H  . heparin injection (subcutaneous)  5,000 Units Subcutaneous Q8H  . lipase/protease/amylase  36,000 Units Oral TID AC  . pantoprazole  40 mg Oral BID  . polyethylene glycol  17 g Oral BID  . senna-docusate  2 tablet Oral BID  . sodium chloride  flush  5 mL Intracatheter Q8H   Continuous Infusions: . sodium chloride 50 mL/hr at 03/19/18 0733  . ondansetron (ZOFRAN) IV       LOS: 6 days    Time spent: [place], MD [facility]  If 7PM-7AM, please contact night-coverage [email or web address] Password [facility] 03/19/2018, 12:07 PM

## 2018-03-19 NOTE — Progress Notes (Signed)
Report given to [facility]. Will transport pt to 1419 after lab draws his labs

## 2018-03-20 ENCOUNTER — Inpatient Hospital Stay (HOSPITAL_COMMUNITY): Payer: Medicare Other

## 2018-03-20 ENCOUNTER — Other Ambulatory Visit: Payer: Medicare Other

## 2018-03-20 ENCOUNTER — Ambulatory Visit: Payer: Medicare Other

## 2018-03-20 ENCOUNTER — Ambulatory Visit: Payer: Medicare Other | Admitting: Hematology & Oncology

## 2018-03-20 ENCOUNTER — Encounter (HOSPITAL_COMMUNITY): Payer: Self-pay | Admitting: Interventional Radiology

## 2018-03-20 ENCOUNTER — Encounter: Payer: Self-pay | Admitting: *Deleted

## 2018-03-20 DIAGNOSIS — K831 Obstruction of bile duct: Principal | ICD-10-CM

## 2018-03-20 DIAGNOSIS — C801 Malignant (primary) neoplasm, unspecified: Secondary | ICD-10-CM

## 2018-03-20 HISTORY — PX: IR CHOLANGIOGRAM EXISTING TUBE: IMG6040

## 2018-03-20 LAB — COMPREHENSIVE METABOLIC PANEL
ALT: 121 U/L — ABNORMAL HIGH (ref 0–44)
AST: 87 U/L — ABNORMAL HIGH (ref 15–41)
Albumin: 2.2 g/dL — ABNORMAL LOW (ref 3.5–5.0)
Alkaline Phosphatase: 269 U/L — ABNORMAL HIGH (ref 38–126)
Anion gap: 11 (ref 5–15)
BUN: 10 mg/dL (ref 8–23)
CO2: 24 mmol/L (ref 22–32)
Calcium: 8.1 mg/dL — ABNORMAL LOW (ref 8.9–10.3)
Chloride: 100 mmol/L (ref 98–111)
Creatinine, Ser: 0.64 mg/dL (ref 0.61–1.24)
GFR calc Af Amer: >60 mL/min (ref >60)
GFR calc non Af Amer: >60 mL/min (ref >60)
Glucose, Bld: 132 mg/dL — ABNORMAL HIGH (ref 70–99)
Potassium: 3.8 mmol/L (ref 3.5–5.1)
Total Bilirubin: 12.5 mg/dL — ABNORMAL HIGH (ref 0.3–1.2)
Total Protein: 5.5 g/dL — ABNORMAL LOW (ref 6.5–8.1)

## 2018-03-20 LAB — MAGNESIUM: Magnesium: 2.2 mg/dL (ref 1.7–2.4)

## 2018-03-20 LAB — CBC
HCT: 26 % — ABNORMAL LOW (ref 39.0–52.0)
Hemoglobin: 8.5 g/dL — ABNORMAL LOW (ref 13.0–17.0)
MCH: 27.7 pg (ref 26.0–34.0)
MCHC: 32.7 g/dL (ref 30.0–36.0)
MCV: 84.7 fL (ref 80.0–100.0)
Platelets: 314 K/uL (ref 150–400)
RBC: 3.07 MIL/uL — ABNORMAL LOW (ref 4.22–5.81)
RDW: 16.3 % — ABNORMAL HIGH (ref 11.5–15.5)
WBC: 10.2 10*3/uL (ref 4.0–10.5)
nRBC: 0 % (ref 0.0–0.2)

## 2018-03-20 LAB — BLOOD CULTURE ID PANEL (REFLEXED)

## 2018-03-20 LAB — COMPREHENSIVE METABOLIC PANEL WITH GFR: Sodium: 135 mmol/L (ref 135–145)

## 2018-03-20 LAB — PROCALCITONIN: Procalcitonin: 20.57 ng/mL

## 2018-03-20 LAB — TROPONIN I: Troponin I: <0.03 ng/mL (ref <0.03)

## 2018-03-20 MED ORDER — IOPAMIDOL (ISOVUE-300) INJECTION 61%
50.0000 mL | Freq: Once | INTRAVENOUS | Status: AC | PRN
  Administered 2018-03-20: 15 mL

## 2018-03-20 MED ORDER — IOPAMIDOL (ISOVUE-300) INJECTION 61%
INTRAVENOUS | Status: AC
  Administered 2018-03-20: 15 mL
  Filled 2018-03-20: qty 50

## 2018-03-20 MED ORDER — SODIUM CHLORIDE 0.9% FLUSH: 10.0000 mL | Freq: Two times a day (BID) | INTRAVENOUS | Status: DC

## 2018-03-20 MED ORDER — SODIUM CHLORIDE 0.9% FLUSH: 10.0000 mL | INTRAVENOUS | Status: DC | PRN

## 2018-03-20 MED ORDER — BUPROPION HCL ER (SMOKING DET) 150 MG PO TB12
150.0000 mg | ORAL_TABLET | Freq: Two times a day (BID) | ORAL | Status: DC
  Administered 2018-03-20: 150 mg via ORAL
  Filled 2018-03-20 (×3): qty 1

## 2018-03-20 MED ORDER — CITALOPRAM HYDROBROMIDE 20 MG PO TABS
40.0000 mg | ORAL_TABLET | Freq: Every day | ORAL | Status: DC
  Administered 2018-03-20 – 2018-03-24 (×5): 40 mg via ORAL
  Filled 2018-03-20 (×5): qty 2

## 2018-03-20 MED ORDER — IOPAMIDOL (ISOVUE-300) INJECTION 61%
INTRAVENOUS | Status: AC
  Filled 2018-03-20: qty 100

## 2018-03-20 MED ORDER — BUPROPION HCL ER (SR) 150 MG PO TB12
150.0000 mg | ORAL_TABLET | Freq: Two times a day (BID) | ORAL | Status: DC
  Administered 2018-03-20 – 2018-03-23 (×7): 150 mg via ORAL
  Filled 2018-03-20 (×7): qty 1

## 2018-03-20 MED ORDER — SODIUM CHLORIDE (PF) 0.9 % IJ SOLN
INTRAMUSCULAR | Status: AC
  Filled 2018-03-20: qty 50

## 2018-03-20 MED ORDER — ACETAMINOPHEN 325 MG PO TABS: 325.0000 mg | ORAL_TABLET | Freq: Once | ORAL | Status: DC | PRN

## 2018-03-20 MED ORDER — IBUPROFEN 800 MG PO TABS
800.0000 mg | ORAL_TABLET | Freq: Four times a day (QID) | ORAL | Status: DC | PRN
  Filled 2018-03-20: qty 1

## 2018-03-20 NOTE — Consult Note (Addendum)
Reason for Consult:CBD compression secondary to gastric cancer  Referring Physician: Thomes Ortega Oncology: Dr. Burney Ortega  PCP: Steven Sacramento, MD  Steven Ortega is an 63 y.o. male.    HPI: Pt is a 63 y/o male with a recent diangosis of St[place] IIA gastric cancer.  He reports GERD-like symptoms for some years.  His symptoms became worse just before Thanksgiving 2019, and was eventually seen in the ED.  CT scan, led to an EGD and biopsies that eventually diagnosed his cancer.    He had his first chemotherapy by Dr. Marin Ortega on 03/06/2018.  He was admitted on 1/9 with a new bilirubin elevation, and progressive jaundice.  Labs prior to his chemotherapy showed a normal bilirubin and LFTs.  Bilirubin on 03/06/2018 was 0.9, alk phos was 306, ALT 404, AST 83.  These elevations were all new from 02/21/2018.  His labs were then repeated again on 03/13/2018, at this point his bilirubin was up to 7.1, alk phos 326, ALT 173, AST 76.   He was admitted after evaluation in the [place] to [facility].  On admission an MRCP was obtained.  This showed continued mural thickening throughout the distal stomach which now.  Infiltrative into the adjacent soft tissue invading the porta hepatis where it exerts mass-effect on the proximal common bile duct.  There was mild intrahepatic ductal dilatation.   This led to further evaluation by GI and to an attempted ERCP. This had to be aborted because the stomach could not be cleared enough to intubate the duodenum. On 03/17/18 he had a IR biliary drain placement with cholangiogram.  Despite the IR drain he continues to have an elevation in his bilirubin and ongoing pain.  Yesterday he became febrile after flushing of his drain.  Despite best efforts by interventional radiology and GI his bilirubin continues to rise and we are asked to see in consultation.  He also reports his stools have become white over the last week or more.  His family describes it as similar to "baby  poop."  He is currently on a regular diet, he had a single spike of his temperature to 102.2 yesterday and then it came down.  Blood cultures are showing gram-negative rods.  His other vital signs are stable.  Admission bilirubin was 5.2.  On 1/13 when the drain was placed it was up to 8.9.  They with the IR drain in place for 2 days the bilirubin is up to 12.5.  AST and ALT are slightly elevated but actually improving some.  Past Medical History:  Diagnosis Date  . Adenomatous colon polyp   . Anal fissure   . Anemia   . Anxiety   . Arthritis   . Cancer of lesser curvature of stomach ([name]) 02/21/2018  . Constipation   . Depression   . Diverticulosis   . GERD (gastroesophageal reflux disease)   . Goals of care, counseling/discussion 02/21/2018  . Hearing aid worn    b/l  . HOH (hard of hearing)   . Hyperlipidemia   . Hypertension   . Hypothyroidism   . Iron deficiency anemia due to chronic blood loss 02/28/2018  . Kidney stones   . Kidney stones   . Pyloric erosion   . UNSPECIFIED ANEMIA 03/11/2008   Qualifier: Diagnosis of  By: Theda Sers CMA, Anderson Malta    . Wears dentures   . Wears glasses     Past Surgical History:  Procedure Laterality Date  . ANAL FISSURE REPAIR  2010  . APPENDECTOMY  1984  . COLONOSCOPY W/ BIOPSIES AND POLYPECTOMY    . ESOPHAGOGASTRODUODENOSCOPY (EGD) WITH PROPOFOL N/A 03/15/2018   Procedure: ESOPHAGOGASTRODUODENOSCOPY (EGD) WITH PROPOFOL;  Surgeon: Steven Mayer, MD;  Location: [facility];  Service: Endoscopy;  Laterality: N/A;  . HERNIA REPAIR    . IR BILIARY DRAIN PLACEMENT WITH CHOLANGIOGRAM  03/17/2018  . IR IMAGING GUIDED PORT INSERTION  03/04/2018  . LUMBAR FUSION  2013  . SHOULDER ARTHROSCOPY WITH SUBACROMIAL DECOMPRESSION, ROTATOR CUFF REPAIR AND BICEP TENDON REPAIR Left 12/09/2017   Procedure: LEFT SHOULDER ARTHROSCOPY SUBACROMIAL DECOMPRESSION, ROTATOR CUFF TEAR REPAIR;  Surgeon: Steven Pel, MD;  Location: [place];  Service: Orthopedics;   Laterality: Left;  . THUMB FUSION Left 2008  . THUMB FUSION Right 2008   x 2  . TRIGGER FINGER RELEASE Left 2008   2nd finger    Family History  Problem Relation Age of Onset  . Heart attack Brother        x 2  . Heart attack Father   . Lung cancer Father   . Colon cancer Neg Hx     Social History:  reports that he quit smoking about 20 years ago. His smoking use included cigarettes and pipe. He quit smokeless tobacco use about 25 years ago.  His smokeless tobacco use included chew. He reports previous alcohol use. He reports that he does not use drugs. Drugs: None  EtOH: None  Tobacco: 1 to 2 packs a day for better part of 20 years;  he quit in his 19s.   Allergies: No Known Allergies  Medications:  Prior to Admission:  Facility-Administered Medications Prior to Admission  Medication Dose Route Frequency Provider Last Rate Last Dose  . 0.9 %  sodium chloride infusion  500 mL Intravenous Once Thornton Park, MD       Medications Prior to Admission  Medication Sig Dispense Refill Last Dose  . ALPRAZolam (XANAX) 1 MG tablet Take 1 mg by mouth at bedtime.    03/12/2018 at Unknown time  . buPROPion (ZYBAN) 150 MG 12 hr tablet Take 150 mg by mouth 2 (two) times daily.   03/13/2018 at Unknown time  . citalopram (CELEXA) 40 MG tablet Take 40 mg by mouth daily.    03/13/2018 at Unknown time  . cyclobenzaprine (FLEXERIL) 10 MG tablet Take 10 mg by mouth at bedtime.   03/12/2018 at Unknown time  . docusate sodium (STOOL SOFTENER) 100 MG capsule Take 100 mg by mouth daily.    03/13/2018 at Unknown time  . HYDROmorphone (DILAUDID) 2 MG tablet Take 1 tablet (2 mg total) by mouth every 4 (four) hours as needed for severe pain. 60 tablet 0 03/13/2018 at 1100  . levothyroxine (SYNTHROID, LEVOTHROID) 25 MCG tablet Take 25 mcg by mouth daily before breakfast.   03/13/2018 at Unknown time  . lidocaine-prilocaine (EMLA) cream Apply to affected area once 30 g 3 not used  . lipase/protease/amylase (CREON)  36000 UNITS CPEP capsule Take 2 capsules (72,000 Units total) by mouth 3 (three) times daily before meals. 180 capsule 3 03/13/2018 at 1100  . ondansetron (ZOFRAN) 8 MG tablet Take 1 tablet (8 mg total) by mouth 2 (two) times daily as needed for refractory nausea / vomiting. Start on day 3 after chemotherapy. 30 tablet 1 not used  . pantoprazole (PROTONIX) 40 MG tablet Take 1 tablet (40 mg total) by mouth 2 (two) times daily before a meal. 60 tablet 3 03/13/2018 at Unknown time  .  polyethylene glycol (MIRALAX / GLYCOLAX) packet Take 17 g by mouth daily.   03/13/2018 at Unknown time  . prochlorperazine (COMPAZINE) 10 MG tablet Take 1 tablet (10 mg total) by mouth every 6 (six) hours as needed (Nausea or vomiting). 30 tablet 1 not used  . tamsulosin (FLOMAX) 0.4 MG CAPS capsule Take 1 capsule (0.4 mg total) by mouth daily. 30 capsule 0 03/13/2018 at Unknown time  . metoCLOPramide (REGLAN) 10 MG tablet Take 2 tablets (20 mg total) by mouth 4 (four) times daily -  before meals and at bedtime. (Patient not taking: Reported on 03/13/2018) 120 tablet 2 Not Taking at Unknown time   Scheduled: . ALPRAZolam  1 mg Oral QHS  . alum & mag hydroxide-simeth  30 mL Oral Q8H  . buPROPion  150 mg Oral BID  . citalopram  40 mg Oral Daily  . cyclobenzaprine  10 mg Oral QHS  . heparin injection (subcutaneous)  5,000 Units Subcutaneous Q8H  . lipase/protease/amylase  36,000 Units Oral TID AC  . pantoprazole  40 mg Oral BID  . polyethylene glycol  17 g Oral BID  . senna-docusate  2 tablet Oral BID  . sodium chloride flush  10-40 mL Intracatheter Q12H  . sodium chloride flush  5 mL Intracatheter Q8H   Continuous: . sodium chloride Stopped (03/19/18 1751)  . ondansetron (ZOFRAN) IV    . piperacillin-tazobactam (ZOSYN)  IV 3.375 g (03/20/18 7628)   Anti-infectives (From admission, onward)   Start     Dose/Rate Route Frequency Ordered Stop   03/19/18 1800  piperacillin-tazobactam (ZOSYN) IVPB 3.375 g  Status:  Discontinued      3.375 g 100 mL/hr over 30 Minutes Intravenous Every 6 hours 03/19/18 1450 03/19/18 1451   03/19/18 1600  piperacillin-tazobactam (ZOSYN) IVPB 3.375 g     3.375 g 12.5 mL/hr over 240 Minutes Intravenous Every 8 hours 03/19/18 1451     03/17/18 1146  piperacillin-tazobactam (ZOSYN) IVPB     over 30 Minutes  Continuous PRN 03/17/18 1147 03/17/18 1146   03/17/18 1144  piperacillin-tazobactam (ZOSYN) 3.375 GM/50ML IVPB    Note to Pharmacy:  Roe Coombs   : cabinet override      03/17/18 1144 03/17/18 2359   03/15/18 1430  ampicillin-sulbactam (UNASYN) 1.5 g in sodium chloride 0.9 % 100 mL IVPB  Status:  Discontinued    Note to Pharmacy:  Please tube to OR ASAP   1.5 g 200 mL/hr over 30 Minutes Intravenous On call 03/15/18 1417 03/15/18 1607   03/15/18 0800  ampicillin-sulbactam (UNASYN) 1.5 g in sodium chloride 0.9 % 100 mL IVPB     1.5 g 200 mL/hr over 30 Minutes Intravenous On call 03/14/18 1917 03/15/18 0804   03/14/18 1930  ampicillin-sulbactam (UNASYN) 1.5 g in sodium chloride 0.9 % 100 mL IVPB  Status:  Discontinued     1.5 g 200 mL/hr over 30 Minutes Intravenous  Once 03/14/18 1833 03/14/18 1917      Results for orders placed or performed during the hospital encounter of 03/13/18 (from the past 48 hour(s))  CBC     Status: Abnormal   Collection Time: 03/19/18  5:44 AM  Result Value Ref Range   WBC 6.6 4.0 - 10.5 K/uL   RBC 3.47 (L) 4.22 - 5.81 MIL/uL   Hemoglobin 9.3 (L) 13.0 - 17.0 g/dL   HCT 29.3 (L) 39.0 - 52.0 %   MCV 84.4 80.0 - 100.0 fL   MCH 26.8 26.0 - 34.0 pg  MCHC 31.7 30.0 - 36.0 g/dL   RDW 15.9 (H) 11.5 - 15.5 %   Platelets 390 150 - 400 K/uL   nRBC 0.0 0.0 - 0.2 %    Comment: Performed at [facility], [place] [address], [place], [place] [address]  Comprehensive metabolic panel     Status: Abnormal   Collection Time: 03/19/18  5:44 AM  Result Value Ref Range   Sodium 136 135 - 145 mmol/L   Potassium 3.7 3.5 - 5.1 mmol/L   Chloride 101  98 - 111 mmol/L   CO2 25 22 - 32 mmol/L   Glucose, Bld 124 (H) 70 - 99 mg/dL   BUN 8 8 - 23 mg/dL   Creatinine, Ser 0.60 (L) 0.61 - 1.24 mg/dL   Calcium 8.3 (L) 8.9 - 10.3 mg/dL   Total Protein 5.8 (L) 6.5 - 8.1 g/dL   Albumin 2.5 (L) 3.5 - 5.0 g/dL   AST 81 (H) 15 - 41 U/L   ALT 126 (H) 0 - 44 U/L   Alkaline Phosphatase 286 (H) 38 - 126 U/L   Total Bilirubin 10.5 (H) 0.3 - 1.2 mg/dL   GFR calc non Af Amer >60 >60 mL/min   GFR calc Af Amer >60 >60 mL/min   Anion gap 10 5 - 15    Comment: Performed at [facility], [place] [address] [place], [place] [address]  Culture, blood (routine x 2)     Status: None (Preliminary result)   Collection Time: 03/19/18  3:24 PM  Result Value Ref Range   Specimen Description      BLOOD RIGHT ARM Performed at [facility] [address], [place], [place] [address]    Special Requests      BOTTLES DRAWN AEROBIC AND ANAEROBIC Blood Culture adequate volume Performed at [facility] [address] [place], [place] [address]    Culture  Setup Time      GRAM NEGATIVE RODS IN BOTH AEROBIC AND ANAEROBIC BOTTLES CRITICAL VALUE NOTED.  VALUE IS CONSISTENT WITH PREVIOUSLY REPORTED AND CALLED VALUE. Performed at [facility], [place] [address], [place], [place] [address]    Culture GRAM NEGATIVE RODS    Report Status PENDING   Troponin I - Now Then Q6H     Status: None   Collection Time: 03/19/18  3:25 PM  Result Value Ref Range   Troponin I <0.03 <0.03 ng/mL    Comment: Performed at [facility], [place] [address], [place],  [address]  Procalcitonin - Baseline     Status: None   Collection Time: 03/19/18  3:25 PM  Result Value Ref Range   Procalcitonin 0.27 ng/mL    Comment:        Interpretation: PCT (Procalcitonin) <= 0.5 ng/mL: Systemic infection (sepsis) is not likely. Local bacterial infection is possible. (NOTE)       Sepsis PCT Algorithm           Lower  Respiratory Tract                                      Infection PCT Algorithm    ----------------------------     ----------------------------         PCT < 0.25 ng/mL                PCT < 0.10 ng/mL  Strongly encourage             Strongly discourage   discontinuation of antibiotics    initiation of antibiotics    ----------------------------     -----------------------------       PCT 0.25 - 0.50 ng/mL            PCT 0.10 - 0.25 ng/mL               OR       >80% decrease in PCT            Discourage initiation of                                            antibiotics      Encourage discontinuation           of antibiotics    ----------------------------     -----------------------------         PCT >= 0.50 ng/mL              PCT 0.26 - 0.50 ng/mL               AND        <80% decrease in PCT             Encourage initiation of                                             antibiotics       Encourage continuation           of antibiotics    ----------------------------     -----------------------------        PCT >= 0.50 ng/mL                  PCT > 0.50 ng/mL               AND         increase in PCT                  Strongly encourage                                      initiation of antibiotics    Strongly encourage escalation           of antibiotics                                     -----------------------------                                           PCT <= 0.25 ng/mL                                                 OR                                        >  80% decrease in PCT                                     Discontinue / Do not initiate                                             antibiotics Performed at [facility] [address], [place], [place] [address]   Culture, blood (routine x 2)     Status: None (Preliminary result)   Collection Time: 03/19/18  3:33 PM  Result Value Ref Range   Specimen Description      BLOOD RIGHT  HAND Performed at [facility] [address] [place], [place] [address]    Special Requests      BOTTLES DRAWN AEROBIC AND ANAEROBIC Blood Culture adequate volume Performed at [facility] [address], [place], [place] [address]    Culture  Setup Time      GRAM NEGATIVE RODS IN BOTH AEROBIC AND ANAEROBIC BOTTLES Organism ID to follow CRITICAL RESULT CALLED TO, READ BACK BY AND VERIFIED WITH: Damian Leavell [id number] MLM Performed at [facility], [place] [address] [place], [place] [address]    Culture GRAM NEGATIVE RODS    Report Status PENDING   Blood Culture ID Panel (Reflexed)     Status: Abnormal   Collection Time: 03/19/18  3:33 PM  Result Value Ref Range   Enterococcus species NOT DETECTED NOT DETECTED   Listeria monocytogenes NOT DETECTED NOT DETECTED   Staphylococcus species NOT DETECTED NOT DETECTED   Staphylococcus aureus (BCID) NOT DETECTED NOT DETECTED   Streptococcus species NOT DETECTED NOT DETECTED   Streptococcus agalactiae NOT DETECTED NOT DETECTED   Streptococcus pneumoniae NOT DETECTED NOT DETECTED   Streptococcus pyogenes NOT DETECTED NOT DETECTED   Acinetobacter baumannii NOT DETECTED NOT DETECTED   Enterobacteriaceae species DETECTED (A) NOT DETECTED    Comment: Enterobacteriaceae represent a large family of gram-negative bacteria, not a single organism. CRITICAL RESULT CALLED TO, READ BACK BY AND VERIFIED WITH: PHARMD J GADHIA [id number] MLM    Enterobacter cloacae complex NOT DETECTED NOT DETECTED   Escherichia coli DETECTED (A) NOT DETECTED    Comment: CRITICAL RESULT CALLED TO, READ BACK BY AND VERIFIED WITH: PHARMD J GADHIA [id number] MLM    Klebsiella oxytoca NOT DETECTED NOT DETECTED   Klebsiella pneumoniae NOT DETECTED NOT DETECTED   Proteus species NOT DETECTED NOT DETECTED   Serratia marcescens NOT DETECTED NOT DETECTED   Carbapenem resistance NOT DETECTED NOT DETECTED   Haemophilus  influenzae NOT DETECTED NOT DETECTED   Neisseria meningitidis NOT DETECTED NOT DETECTED   Pseudomonas aeruginosa NOT DETECTED NOT DETECTED   Candida albicans NOT DETECTED NOT DETECTED   Candida glabrata NOT DETECTED NOT DETECTED   Candida krusei NOT DETECTED NOT DETECTED   Candida parapsilosis NOT DETECTED NOT DETECTED   Candida tropicalis NOT DETECTED NOT DETECTED    Comment: Performed at [facility] [address] [place], [place] [address]  Troponin I - Now Then Q6H     Status: None   Collection Time: 03/19/18 10:26 PM  Result Value Ref Range   Troponin I <0.03 <0.03 ng/mL    Comment: Performed at [facility], [place]  [address], [place], [place] [address]  Troponin I - Now Then Q6H     Status: None   Collection Time: 03/20/18  4:00 AM  Result Value Ref Range   Troponin I <0.03 <0.03 ng/mL    Comment: Performed at [facility], [place] [address], [place], [place] [address]  Comprehensive metabolic panel     Status: Abnormal   Collection Time: 03/20/18  4:00 AM  Result Value Ref Range   Sodium 135 135 - 145 mmol/L   Potassium 3.8 3.5 - 5.1 mmol/L   Chloride 100 98 - 111 mmol/L   CO2 24 22 - 32 mmol/L   Glucose, Bld 132 (H) 70 - 99 mg/dL   BUN 10 8 - 23 mg/dL   Creatinine, Ser 0.64 0.61 - 1.24 mg/dL   Calcium 8.1 (L) 8.9 - 10.3 mg/dL   Total Protein 5.5 (L) 6.5 - 8.1 g/dL   Albumin 2.2 (L) 3.5 - 5.0 g/dL   AST 87 (H) 15 - 41 U/L   ALT 121 (H) 0 - 44 U/L   Alkaline Phosphatase 269 (H) 38 - 126 U/L   Total Bilirubin 12.5 (H) 0.3 - 1.2 mg/dL   GFR calc non Af Amer >60 >60 mL/min   GFR calc Af Amer >60 >60 mL/min   Anion gap 11 5 - 15    Comment: Performed at [facility] [place] [address], [place], [place] [address]  CBC     Status: Abnormal   Collection Time: 03/20/18  4:00 AM  Result Value Ref Range   WBC 10.2 4.0 - 10.5 K/uL   RBC 3.07 (L) 4.22 - 5.81 MIL/uL   Hemoglobin 8.5 (L) 13.0 - 17.0 g/dL   HCT 26.0 (L)  39.0 - 52.0 %   MCV 84.7 80.0 - 100.0 fL   MCH 27.7 26.0 - 34.0 pg   MCHC 32.7 30.0 - 36.0 g/dL   RDW 16.3 (H) 11.5 - 15.5 %   Platelets 314 150 - 400 K/uL   nRBC 0.0 0.0 - 0.2 %    Comment: Performed at [facility], [place] [address], [place], [place] [address]  Magnesium     Status: None   Collection Time: 03/20/18  4:00 AM  Result Value Ref Range   Magnesium 2.2 1.7 - 2.4 mg/dL    Comment: Performed at [facility], [place] [address], [place], [place] [address]  Procalcitonin     Status: None   Collection Time: 03/20/18  4:00 AM  Result Value Ref Range   Procalcitonin 20.57 ng/mL    Comment:        Interpretation: PCT >= 10 ng/mL: Important systemic inflammatory response, almost exclusively due to severe bacterial sepsis or septic shock. (NOTE)       Sepsis PCT Algorithm           Lower Respiratory Tract                                      Infection PCT Algorithm    ----------------------------     ----------------------------         PCT < 0.25 ng/mL                PCT < 0.10 ng/mL         Strongly encourage             Strongly discourage  discontinuation of antibiotics    initiation of antibiotics    ----------------------------     -----------------------------       PCT 0.25 - 0.50 ng/mL            PCT 0.10 - 0.25 ng/mL               OR       >80% decrease in PCT            Discourage initiation of                                            antibiotics      Encourage discontinuation           of antibiotics    ----------------------------     -----------------------------         PCT >= 0.50 ng/mL              PCT 0.26 - 0.50 ng/mL                AND       <80% decrease in PCT             Encourage initiation of                                             antibiotics       Encourage continuation           of antibiotics    ----------------------------     -----------------------------        PCT >= 0.50 ng/mL                   PCT > 0.50 ng/mL               AND         increase in PCT                  Strongly encourage                                      initiation of antibiotics    Strongly encourage escalation           of antibiotics                                     -----------------------------                                           PCT <= 0.25 ng/mL                                                 OR                                        >  80% decrease in PCT                                     Discontinue / Do not initiate                                             antibiotics Performed at [facility] [address] [place], [place] [address]     Dg Chest Port 1 View  Result Date: 03/19/2018 CLINICAL DATA:  63 year old male with shortness of breath. Gastric cancer. Recently placed internal external biliary drain. EXAM: PORTABLE CHEST 1 VIEW COMPARISON:  Chest radiographs 09/16/2016. FINDINGS: Portable AP semi upright view at 1627 hours. Right chest IJ approach power port in place and accessed. Normal cardiac size and mediastinal contours. Visualized tracheal air column is within normal limits. No pneumothorax, pulmonary edema or pleural effusion. Mild lung base atelectasis greater on the left. Right upper quadrant catheter is partially visible. Paucity of bowel gas in the upper abdomen. No acute osseous abnormality identified. IMPRESSION: Mild atelectasis, otherwise no acute cardiopulmonary abnormality. Electronically Signed   By: Genevie Ann M.D.   On: 03/19/2018 16:58    Review of Systems  Constitutional: Positive for fever, malaise/fatigue and weight loss (family reports 50-60 lb weight loss since Thanksgiving). Negative for diaphoresis.  HENT: Negative.   Eyes: Negative.        Icteric sclera  Respiratory: Negative.   Cardiovascular: Negative.   Gastrointestinal: Positive for abdominal pain, heartburn and nausea. Negative for blood in stool, constipation, diarrhea and  vomiting.       Loose stools that are white and pasty in appearance.  He also has some urgency when he has to have a bowel movement.  He has had GERD-like symptoms for years.  It became progressively worse leading to the evaluation in December.  His GERD symptoms remain an issue and as the EGD shows he was emptying his stomach poorly.  Genitourinary:       Urine is dark.  Musculoskeletal: Negative.   Skin: Negative.        He started very jaundice over the last 1-1.5 weeks.  Neurological: Negative.   Endo/Heme/Allergies: Negative.   Psychiatric/Behavioral: Negative.    Blood pressure (!) 110/50, pulse 73, temperature 99 F (37.2 C), temperature source Oral, resp. rate 19, height 6' (1.829 m), weight 94.6 kg, SpO2 95 %. Physical Exam  Constitutional: He is oriented to person, place, and time. No distress.  Elderly very jaundiced white male in no acute distress.  He does not seem to be having pain currently.  HENT:  Head: Normocephalic and atraumatic.  Mouth/Throat: Oropharynx is clear and moist. No oropharyngeal exudate.  Eyes: Right eye exhibits no discharge. Left eye exhibits no discharge. No scleral icterus.  Pupils are equal  Neck: Normal range of motion. Neck supple. No JVD present. No tracheal deviation present. No thyromegaly present.  Cardiovascular: Normal rate, regular rhythm, normal heart sounds and intact distal pulses. Exam reveals no gallop.  No murmur heard. Respiratory: Effort normal and breath sounds normal. No respiratory distress. He has no wheezes. He has no rales. He exhibits no tenderness.  GI: Soft. Bowel sounds are normal. He exhibits no distension and no mass. There is no abdominal tenderness. There is no rebound and no guarding.  Musculoskeletal: Normal range of motion.        General: No tenderness or edema.  Lymphadenopathy:    He has no cervical adenopathy.  Neurological: He is alert and oriented to person, place, and time. No cranial nerve deficit.  Skin:  Skin is warm and dry. No rash noted. He is not diaphoretic. No erythema. No pallor.  Jaundice is primary change in skin.  Psychiatric: He has a normal mood and affect. His behavior is normal. Judgment and thought content normal.    Assessment/Plan: Stage II gastric cancer with new gastric outlet obstruction New soft tissue invading the porta hepatis with compression of the proximal common bile duct Hyperbilirubinemia secondary to compression of the common bile duct IR biliary drain placement Acquired hypothyroidism GERD Hx tobacco use Hx of anemia Weight loss  Plan: He has been seen and evaluated by Dr. Lucia Gaskins.  Dr. Lucia Gaskins spent 30 minutes talking with the family and explaining some of the issues involved.  Dr. Lucia Gaskins is concerned that he has a more aggressive cancer than what was originally thought.   The cancer has probably invaded the porta hepatis region causing compression of the CBD.  He is currently undergoing repeat cholangiogram by interventional radiology.    At this point it is unclear whether any of this is amenable to surgical relief.  Will await the cholangiogram and follow with you.  We will also forward this information to Dr. Barry Dienes who is our oncology surgeon.  JENNINGS,WILLARD 03/20/2018, 7:46 AM   Agree with above. Wife and two sisters are in the room with the patient.  He has had a relatively short course since his diagnosis and has done poorly.  It seems that his symptoms predated his diagnosis by about 3 months.  I can see no acute surgical role for his biliary obstruction.  He is getting studied in IR to see how well the PTC is working.  His oral intake has been poor, but he does not have a complete gastric outley obstruction (yet).  [addendum:  Cholangiogram today shows PTC in good position, but appears to be draining only the left side of liver - there is no visualization of the right hepatic biliary system.  Discussed with Dr. Ardis Hughs - plan CT tomorrow (he has  claustrophobia and does not do well with MRI) to evaluate right lobe of liver and evaluate the stomach and its outlet]  Alphonsa Overall, MD, [facility] Surgery Pager: [phone] Office phone:  [phone]

## 2018-03-20 NOTE — Progress Notes (Addendum)
[facility] Progress Note  CC:  Gastric outlet obstruction, rising liver tests  Subjective:  Feeling better today since starting antibiotics and temp is down.  Still has a lot of upper abdominal pain, but controlled with pain medication.  Objective:  Vital signs in last 24 hours: Temp:  [99 F (37.2 C)-103 F (39.4 C)] 99 F (37.2 C) (01/16 0415) Pulse Rate:  [70-100] 73 (01/16 0415) Resp:  [16-19] 19 (01/16 0415) BP: (110-156)/(50-81) 110/50 (01/16 0415) SpO2:  [89 %-100 %] 95 % (01/16 0415) Last BM Date: 03/18/18 General:  Alert, Well-developed, in NAD; jaundice noted Heart:  Regular rate and rhythm; no murmurs Pulm:  CTAB.  No increased WOB. Abdomen:  Soft, non-distended.  BS present.  Diffuse TTP. Extremities:  Without edema. Neurologic:  Alert and oriented x 4;  grossly normal neurologically. Psych:  Alert and cooperative. Normal mood and affect.  Intake/Output from previous day: 01/15 0701 - 01/16 0700 In: 6321.2 [I.V.:6319.5; IV Piggyback:1.7] Out: -   Lab Results: Recent Labs    03/18/18 0513 03/19/18 0544 03/20/18 0400  WBC 5.5 6.6 10.2  HGB 9.2* 9.3* 8.5*  HCT 28.8* 29.3* 26.0*  PLT 411* 390 314   BMET Recent Labs    03/18/18 0513 03/19/18 0544 03/20/18 0400  NA 136 136 135  K 3.3* 3.7 3.8  CL 102 101 100  CO2 '23 25 24  '$ GLUCOSE 106* 124* 132*  BUN 7* 8 10  CREATININE 0.55* 0.60* 0.64  CALCIUM 8.3* 8.3* 8.1*   LFT Recent Labs    03/20/18 0400  PROT 5.5*  ALBUMIN 2.2*  AST 87*  ALT 121*  ALKPHOS 269*  BILITOT 12.5*   Dg Chest Port 1 View  Result Date: 03/19/2018 CLINICAL DATA:  63 year old male with shortness of breath. Gastric cancer. Recently placed internal external biliary drain. EXAM: PORTABLE CHEST 1 VIEW COMPARISON:  Chest radiographs 09/16/2016. FINDINGS: Portable AP semi upright view at 1627 hours. Right chest IJ approach power port in place and accessed. Normal cardiac size and mediastinal contours. Visualized  tracheal air column is within normal limits. No pneumothorax, pulmonary edema or pleural effusion. Mild lung base atelectasis greater on the left. Right upper quadrant catheter is partially visible. Paucity of bowel gas in the upper abdomen. No acute osseous abnormality identified. IMPRESSION: Mild atelectasis, otherwise no acute cardiopulmonary abnormality. Electronically Signed   By: Genevie Ann M.D.   On: 03/19/2018 16:58   Assessment / Plan: 63 y.o. male with gastric adenocarcinoma (Stage IIA) that is causing biliary obstruction and GOO.  MRI this admission shows locally advancing disease. *GOO:  Dr. Ardis Hughs to be around later to discuss further regarding options of stenting, etc. *Biliary obstruction causing Ecoli sepsis:  Had drain placed by IR that clearly has not been working as Vergia Alcon has continued to trend up.  IR to interrogate and manipulate drain today.  On antibiotics.  No further GI intervention for this issue planned at this time.   LOS: 7 days   Laban Emperor. Zehr  03/20/2018, 1:37 PM  ________________________________________________________________________  [facility] Heckler GI MD note:  I personally examined the patient, reviewed the data and agree with the assessment and plan described above.  He has a distal gastric cancer that is causing biliary obstruction and also partial gastric outlet obstruction.  The current IR drain is functioning, draining well. Since his LFTs continue to rise there is very possibly something else going on in the liver (?abscess, ?obstructed ducts that do not communicate with  the PTC drain).  I think further liver imaging will be helpful.  I considered MR but he is very claustrophobic and I think CT will suffice plus it will give information about his gastric tumor as well. His partal GOO may be amenable to enteral stenting. Will consider that after biliary/liver issue more settled.  We will order CT scan IV and oral contrast of abd/liver and will follow along.   Owens Loffler, MD [facility] Pager [phone]

## 2018-03-20 NOTE — Progress Notes (Signed)
PROGRESS NOTE    Steven Ortega  SLH:[id number] DOB: Apr 11, 1955 DOA: 03/13/2018 PCP: Christain Sacramento, MD   Brief Narrative:  63 year old with past medical history relevant for recent diagnosis of gastric cancer complicated by gastric outlet obstruction, hypothyroidism, BPH, pancreatic insufficiency, depression/anxiety who presented with extrinsic compression of biliary duct due to progression of gastric cancer status post failed ERCP and IR guided biliary placement on 03/17/2018.   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 coli sepsis from a biliary source: Yesterday in the afternoon patient spiked fever to 103 and was noted to be having rigors.  Blood cultures were taken and patient was started on empiric Zosyn.  Presumed source is the biliary obstruction. - We will discuss with general surgery and interventional radiology about possible better drainage - Continue IV Zosyn started 03/19/2018 -Blood cultures from 03/19/2018 growing out 2 out of 2 E. coli, pending susceptibilities  #) Elevated LFTs/hyperbilirubinemia due to extrinsic compression of CBD by gastric cancer: Bilirubin continues to elevate and now patient has evidence of possible cholangitis. -Interventional radiology does not feel that elevated bilirubin is due to obstruction -General surgery consult, pending recommendations -Oncology following, appreciate recommendations  #) Gastric cancer complicated by gastric outlet obstruction: Gastroenterology has seen the patient and feels that he should only be on a full liquid diet and not advance at this time until he gets therapy for the gastric cancer -Full liquid diet, do not advance  #) Pancreatic insufficiency/slow motility: -Continue Creon with meals  #) Pain/psych: -Continue alprazolam 1 mg nightly -Continue bupropion 150 mg twice  daily -Continue citalopram 40 mg daily -Continue PRN cyclobenzaprine  #) BPH: -Continue tamsulosin 0.4 mg daily  #) Hypothyroidism: -Continue levothyroxine 25 mcg every morning  Fluids: Tolerating p.o. Electrolytes: Monitor and supplement Nutrition: Full liquid diet  Prophylaxis: SCDs  Disposition: Pending resolution of biliary obstruction  Full code  Consultants:   Gastroenterology  Oncology, Dr. Marin Olp  Interventional radiology  Procedures:   03/15/2018 ERCP: Aborted due to significant stomach contents  03/17/2018 IR guided biliary drain placement  Antimicrobials:   None   Subjective: This morning patient continues to report significant pain in his right upper quadrant as well as some pain in his back over the right side.  His appetite is quite poor.  He has been kept n.p.o. for possible interrogation by interventional radiology.  He denies any nausea or vomiting or diarrhea.  He has not had any cough.  Objective: Vitals:   03/19/18 1706 03/19/18 1900 03/19/18 2002 03/20/18 0415  BP:   116/67 (!) 110/50  Pulse:   89 73  Resp:   17 19  Temp: (!) 100.5 F (38.1 C) 99.2 F (37.3 C) 99.5 F (37.5 C) 99 F (37.2 C)  TempSrc: Oral  Oral Oral  SpO2:   93% 95%  Weight:      Height:        Intake/Output Summary (Last 24 hours) at 03/20/2018 0930 Last data filed at 03/19/2018 1800 Gross per 24 hour  Intake 6321.23 ml  Output -  Net 6321.23 ml   Filed Weights   03/15/18 0400 03/17/18 0550 03/18/18 0405  Weight: 94.6 kg 95 kg 94.6 kg    Examination:  General exam: Appears calm mildly uncomfortable Respiratory system: Clear to auscultation. Respiratory effort normal. Cardiovascular system: Regular rate and rhythm, no murmurs  Gastrointestinal system: Soft, moderately tender in right upper quadrant, no rebound or guarding, plus bowel sounds. Central nervous system: Alert and oriented.  Grossly intact, moving all extremities Extremities: Trace lower  extremity edema. Skin: Grossly icteric, percutaneous biliary drain site is clean dry and intact, port site is clean dry and intact Psychiatry: Judgement and insight appear normal. Mood & affect appropriate.     Data Reviewed: I have personally reviewed following labs and imaging studies  CBC: Recent Labs  Lab 03/13/18 1400  03/16/18 0807 03/17/18 0556 03/18/18 0513 03/19/18 0544 03/20/18 0400  WBC 5.1   < > 5.2 4.6 5.5 6.6 10.2  NEUTROABS 3.7  --   --   --   --   --   --   HGB 9.5*   < > 8.8* 8.9* 9.2* 9.3* 8.5*  HCT 29.1*   < > 28.7* 28.8* 28.8* 29.3* 26.0*  MCV 85.6   < > 86.2 87.5 86.2 84.4 84.7  PLT 366   < > 432* 382 411* 390 314   < > = values in this interval not displayed.   Basic Metabolic Panel: Recent Labs  Lab 03/13/18 2236 03/14/18 0500  03/16/18 0807 03/17/18 0556 03/18/18 0513 03/19/18 0544 03/20/18 0400  NA  --  135   < > 138 138 136 136 135  K  --  3.5   < > 3.6 3.4* 3.3* 3.7 3.8  CL  --  103   < > 109 106 102 101 100  CO2  --  22   < > 20* 22 23 25 24   GLUCOSE  --  106*   < > 107* 106* 106* 124* 132*  BUN  --  8   < > 10 7* 7* 8 10  CREATININE  --  0.70   < > 0.71 0.64 0.55* 0.60* 0.64  CALCIUM  --  8.5*   < > 8.4* 8.3* 8.3* 8.3* 8.1*  MG 2.2 2.1  --   --   --   --   --  2.2   < > = values in this interval not displayed.   GFR: Estimated Creatinine Clearance: 111.4 mL/min (by C-G formula based on SCr of 0.64 mg/dL). Liver Function Tests: Recent Labs  Lab 03/16/18 0807 03/17/18 0556 03/18/18 0513 03/19/18 0544 03/20/18 0400  AST 89* 97* 103* 81* 87*  ALT 133* 138* 146* 126* 121*  ALKPHOS 267* 276* 290* 286* 269*  BILITOT 8.5* 8.9* 10.0* 10.5* 12.5*  PROT 5.8* 5.8* 6.0* 5.8* 5.5*  ALBUMIN 2.4* 2.5* 2.5* 2.5* 2.2*   No results for input(s): LIPASE, AMYLASE in the last 168 hours. No results for input(s): AMMONIA in the last 168 hours. Coagulation Profile: Recent Labs  Lab 03/16/18 0807  INR 1.31   Cardiac Enzymes: Recent Labs  Lab  03/19/18 1525 03/19/18 2226 03/20/18 0400  TROPONINI <0.03 <0.03 <0.03   BNP (last 3 results) No results for input(s): PROBNP in the last 8760 hours. HbA1C: No results for input(s): HGBA1C in the last 72 hours. CBG: No results for input(s): GLUCAP in the last 168 hours. Lipid Profile: No results for input(s): CHOL, HDL, LDLCALC, TRIG, CHOLHDL, LDLDIRECT in the last 72 hours. Thyroid Function Tests: No results for input(s): TSH, T4TOTAL, FREET4, T3FREE, THYROIDAB in the last 72 hours. Anemia Panel: No results for input(s): VITAMINB12, FOLATE, FERRITIN, TIBC, IRON, RETICCTPCT in the last 72 hours. Sepsis Labs: Recent Labs  Lab 03/19/18 1525 03/20/18 0400  PROCALCITON 0.27 20.57  Recent Results (from the past 240 hour(s))  Culture, blood (routine x 2)     Status: None (Preliminary result)   Collection Time: 03/19/18  3:24 PM  Result Value Ref Range Status   Specimen Description   Final    BLOOD RIGHT ARM Performed at [facility] [address], [place], [place] [address]    Special Requests   Final    BOTTLES DRAWN AEROBIC AND ANAEROBIC Blood Culture adequate volume Performed at [facility] [address], [place], [place] [address]    Culture  Setup Time   Final    GRAM NEGATIVE RODS IN BOTH AEROBIC AND ANAEROBIC BOTTLES CRITICAL VALUE NOTED.  VALUE IS CONSISTENT WITH PREVIOUSLY REPORTED AND CALLED VALUE. Performed at [facility], [place] [address], [place], [place] [address]    Culture GRAM NEGATIVE RODS  Final   Report Status PENDING  Incomplete  Culture, blood (routine x 2)     Status: None (Preliminary result)   Collection Time: 03/19/18  3:33 PM  Result Value Ref Range Status   Specimen Description   Final    BLOOD RIGHT HAND Performed at [facility] [address], [place], [place] [address]    Special Requests   Final    BOTTLES DRAWN AEROBIC AND ANAEROBIC Blood Culture adequate  volume Performed at [facility] [address] [place], [place] [address]    Culture  Setup Time   Final    GRAM NEGATIVE RODS IN BOTH AEROBIC AND ANAEROBIC BOTTLES Organism ID to follow CRITICAL RESULT CALLED TO, READ BACK BY AND VERIFIED WITH: Damian Leavell [id number] MLM Performed at [facility], [place] [address], [place], [place] [address]    Culture GRAM NEGATIVE RODS  Final   Report Status PENDING  Incomplete  Blood Culture ID Panel (Reflexed)     Status: Abnormal   Collection Time: 03/19/18  3:33 PM  Result Value Ref Range Status   Enterococcus species NOT DETECTED NOT DETECTED Final   Listeria monocytogenes NOT DETECTED NOT DETECTED Final   Staphylococcus species NOT DETECTED NOT DETECTED Final   Staphylococcus aureus (BCID) NOT DETECTED NOT DETECTED Final   Streptococcus species NOT DETECTED NOT DETECTED Final   Streptococcus agalactiae NOT DETECTED NOT DETECTED Final   Streptococcus pneumoniae NOT DETECTED NOT DETECTED Final   Streptococcus pyogenes NOT DETECTED NOT DETECTED Final   Acinetobacter baumannii NOT DETECTED NOT DETECTED Final   Enterobacteriaceae species DETECTED (A) NOT DETECTED Final    Comment: Enterobacteriaceae represent a large family of gram-negative bacteria, not a single organism. CRITICAL RESULT CALLED TO, READ BACK BY AND VERIFIED WITH: PHARMD J GADHIA [id number] MLM    Enterobacter cloacae complex NOT DETECTED NOT DETECTED Final   Escherichia coli DETECTED (A) NOT DETECTED Final    Comment: CRITICAL RESULT CALLED TO, READ BACK BY AND VERIFIED WITH: PHARMD J GADHIA [id number] MLM    Klebsiella oxytoca NOT DETECTED NOT DETECTED Final   Klebsiella pneumoniae NOT DETECTED NOT DETECTED Final   Proteus species NOT DETECTED NOT DETECTED Final   Serratia marcescens NOT DETECTED NOT DETECTED Final   Carbapenem resistance NOT DETECTED NOT DETECTED Final   Haemophilus influenzae NOT DETECTED NOT DETECTED Final   Neisseria  meningitidis NOT DETECTED NOT DETECTED Final   Pseudomonas aeruginosa NOT DETECTED NOT DETECTED Final   Candida albicans NOT DETECTED NOT DETECTED Final   Candida glabrata NOT DETECTED NOT DETECTED Final  Candida krusei NOT DETECTED NOT DETECTED Final   Candida parapsilosis NOT DETECTED NOT DETECTED Final   Candida tropicalis NOT DETECTED NOT DETECTED Final    Comment: Performed at [facility], [place] [address], [place], [place] [address]         Radiology Studies: Dg Chest [place] 1 View  Result Date: 03/19/2018 CLINICAL DATA:  63 year old male with shortness of breath. Gastric cancer. Recently placed internal external biliary drain. EXAM: PORTABLE CHEST 1 VIEW COMPARISON:  Chest radiographs 09/16/2016. FINDINGS: Portable AP semi upright view at 1627 hours. Right chest IJ approach power port in place and accessed. Normal cardiac size and mediastinal contours. Visualized tracheal air column is within normal limits. No pneumothorax, pulmonary edema or pleural effusion. Mild lung base atelectasis greater on the left. Right upper quadrant catheter is partially visible. Paucity of bowel gas in the upper abdomen. No acute osseous abnormality identified. IMPRESSION: Mild atelectasis, otherwise no acute cardiopulmonary abnormality. Electronically Signed   By: Genevie Ann M.D.   On: 03/19/2018 16:58        Scheduled Meds: . ALPRAZolam  1 mg Oral QHS  . alum & mag hydroxide-simeth  30 mL Oral Q8H  . buPROPion  150 mg Oral BID  . citalopram  40 mg Oral Daily  . cyclobenzaprine  10 mg Oral QHS  . heparin injection (subcutaneous)  5,000 Units Subcutaneous Q8H  . lipase/protease/amylase  36,000 Units Oral TID AC  . pantoprazole  40 mg Oral BID  . polyethylene glycol  17 g Oral BID  . senna-docusate  2 tablet Oral BID  . sodium chloride flush  10-40 mL Intracatheter Q12H  . sodium chloride flush  5 mL Intracatheter Q8H   Continuous Infusions: . sodium chloride Stopped (03/19/18 1751)  .  ondansetron (ZOFRAN) IV    . piperacillin-tazobactam (ZOSYN)  IV 3.375 g (03/20/18 0822)     LOS: 7 days    Time spent: [place], MD [facility]  If 7PM-7AM, please contact night-coverage [email or web address] Password [email or web address] 03/20/2018, 9:30 AM

## 2018-03-20 NOTE — Progress Notes (Signed)
Writer flushed bilary drain with 5 ML Normal Saline per order. Pt tolerated well. No resistance noted. Will continue to monitor.

## 2018-03-20 NOTE — Care Management Important Message (Signed)
Important Message  Patient Details  Name: IZEK CORVINO MRN: [id number] Date of Birth: [birth year]   Medicare Important Message Given:  Yes    Kerin Salen 03/20/2018, 11:35 AMImportant Message  Patient Details  Name: DEMETRI GOSHERT MRN: [id number] Date of Birth: 1956-02-17   Medicare Important Message Given:  Yes    Kerin Salen 03/20/2018, 11:35 AM

## 2018-03-20 NOTE — Progress Notes (Signed)
Unfortunately, I think were starting to have a real problem.  His bilirubin is now 12.5.  Somehow, the bilirubin is not draining properly.  I will know if he actually needs to go down to interventional radiology and have some dye studies done to see exactly where that drainage tube is.  He apparently had a temperature spike yesterday.  He was moved out of the fourth floor.  Cultures were taken.  He had a chest x-ray which was negative.  I have a feeling that he may need to have surgery get involved now.  I really wanted to try neoadjuvant chemotherapy and then surgery to try to improve his prognosis.  However, if we cannot get the bilirubin down, then he is going need to have surgery to see if they can resect out his malignancy.  His nutrition is becoming an issue.  With all these procedures, he is just not eating all that much.  I do not know if he needs to have TNA started.  I would hope that he does not.  His CBC shows a white cell count of 10.2.  Hemoglobin 8.5.  Platelet count 314,000.  His SGPT is 121 SGOT is 87.  Alkaline phosphatase 269.  His albumin is down to 2.2.  He would be nice if he can move back up to the sixth floor.  I am not sure he needs to be on a cardiac monitor.  This is certainly much more complicated than I would have thought it would be.  I really would hate to see him needing surgery to try to correct everything.  By the PET scan, he does not have any metastatic disease.  He has what I would think is locally advanced disease.  However, this could be resected and we could do adjuvant chemotherapy.  I do appreciate everybody's help.  Lattie Haw, MD  [place] 14:14

## 2018-03-20 NOTE — Progress Notes (Signed)
Patient's wife requested flu swab to be ordered for patient.  Order placed by Dr. Marin Olp. Explained flu swab procedure to patient.  Patient and wife refused flu swab.  Dr. Herbert Moors informed; flu swab/ droplet precaution orders discontinued.  Patient currently afebrile.  Will continue to monitor.

## 2018-03-20 NOTE — Progress Notes (Signed)
Patient returned to room after procedure in IR.  Patient noted to have severe chills; oral temperature of 102.3.  Tylenol given.  PRN morphine also administered per patient request.  Patient chills are subsiding at this time; Dr. Herbert Moors notified.  Family at bedside.  Will continue to monitor.

## 2018-03-20 NOTE — Progress Notes (Signed)
PHARMACY - PHYSICIAN COMMUNICATION CRITICAL VALUE ALERT - BLOOD CULTURE IDENTIFICATION (BCID)  Steven Ortega is an 63 y.o. male who presented to [facility] on 03/13/2018 with a chief complaint of right upper quadrant pain; LFTs elevated.  Assessment: He was recent diagnosis of gastric cancer complicated by gastric outlet obstruction with extrinsic compression of biliary duct due to progression of gastric cancer status post failed ERCP and IR guided biliary placement on 03/17/2018. Now with 4/4 blood cultures E.coli.  Name of physician (or Provider) Contacted: Dr. Herbert Moors  Current antibiotics: Zosyn  Changes to prescribed antibiotics recommended:  Continue current abx  Results for orders placed or performed during the hospital encounter of 03/13/18  Blood Culture ID Panel (Reflexed) (Collected: 03/19/2018  3:33 PM)  Result Value Ref Range   Enterococcus species NOT DETECTED NOT DETECTED   Listeria monocytogenes NOT DETECTED NOT DETECTED   Staphylococcus species NOT DETECTED NOT DETECTED   Staphylococcus aureus (BCID) NOT DETECTED NOT DETECTED   Streptococcus species NOT DETECTED NOT DETECTED   Streptococcus agalactiae NOT DETECTED NOT DETECTED   Streptococcus pneumoniae NOT DETECTED NOT DETECTED   Streptococcus pyogenes NOT DETECTED NOT DETECTED   Acinetobacter baumannii NOT DETECTED NOT DETECTED   Enterobacteriaceae species DETECTED (A) NOT DETECTED   Enterobacter cloacae complex NOT DETECTED NOT DETECTED   Escherichia coli DETECTED (A) NOT DETECTED   Klebsiella oxytoca NOT DETECTED NOT DETECTED   Klebsiella pneumoniae NOT DETECTED NOT DETECTED   Proteus species NOT DETECTED NOT DETECTED   Serratia marcescens NOT DETECTED NOT DETECTED   Carbapenem resistance NOT DETECTED NOT DETECTED   Haemophilus influenzae NOT DETECTED NOT DETECTED   Neisseria meningitidis NOT DETECTED NOT DETECTED   Pseudomonas aeruginosa NOT DETECTED NOT DETECTED   Candida albicans NOT DETECTED NOT DETECTED   Candida glabrata NOT DETECTED NOT DETECTED   Candida krusei NOT DETECTED NOT DETECTED   Candida parapsilosis NOT DETECTED NOT DETECTED   Candida tropicalis NOT DETECTED NOT DETECTED    Peggyann Juba, PharmD, BCPS Pager: [phone] 03/20/2018  8:47 AM

## 2018-03-20 NOTE — Progress Notes (Signed)
Referring Physician(s): Darci Current  Supervising Physician: Arne Cleveland  Patient Status:  [facility] - In-pt  Chief Complaint:  Gastric cancer, obstructive jaundice  Subjective: Pt doing ok; still has some abd discomfort but sl better than prev days; did have episode of rigors/fever after drain flush yesterday; currently denies N/V; minimal appetite   Allergies: Patient has no known allergies.  Medications: Prior to Admission medications   Medication Sig Start Date End Date Taking? Authorizing Provider  ALPRAZolam Duanne Moron) 1 MG tablet Take 1 mg by mouth at bedtime.    Yes [provider]  buPROPion (ZYBAN) 150 MG 12 hr tablet Take 150 mg by mouth 2 (two) times daily.   Yes [provider]  citalopram (CELEXA) 40 MG tablet Take 40 mg by mouth daily.    Yes [provider]  cyclobenzaprine (FLEXERIL) 10 MG tablet Take 10 mg by mouth at bedtime.   Yes [provider]  docusate sodium (STOOL SOFTENER) 100 MG capsule Take 100 mg by mouth daily.    Yes [provider]  HYDROmorphone (DILAUDID) 2 MG tablet Take 1 tablet (2 mg total) by mouth every 4 (four) hours as needed for severe pain. 03/10/18  Yes Ennever, Rudell Cobb, MD  levothyroxine (SYNTHROID, LEVOTHROID) 25 MCG tablet Take 25 mcg by mouth daily before breakfast.   Yes [provider]  lidocaine-prilocaine (EMLA) cream Apply to affected area once 02/28/18  Yes Ennever, Rudell Cobb, MD  lipase/protease/amylase (CREON) 36000 UNITS CPEP capsule Take 2 capsules (72,000 Units total) by mouth 3 (three) times daily before meals. 02/28/18  Yes Ennever, Rudell Cobb, MD  ondansetron (ZOFRAN) 8 MG tablet Take 1 tablet (8 mg total) by mouth 2 (two) times daily as needed for refractory nausea / vomiting. Start on day 3 after chemotherapy. 02/28/18  Yes Volanda Napoleon, MD  pantoprazole (PROTONIX) 40 MG tablet Take 1 tablet (40 mg total) by mouth 2 (two) times daily before a meal. 02/06/18  Yes Esterwood,  Amy S, PA-C  polyethylene glycol (MIRALAX / GLYCOLAX) packet Take 17 g by mouth daily.   Yes [provider]  prochlorperazine (COMPAZINE) 10 MG tablet Take 1 tablet (10 mg total) by mouth every 6 (six) hours as needed (Nausea or vomiting). 02/28/18  Yes Volanda Napoleon, MD  tamsulosin (FLOMAX) 0.4 MG CAPS capsule Take 1 capsule (0.4 mg total) by mouth daily. 09/18/15  Yes Hedges, Dellis Filbert, PA-C  metoCLOPramide (REGLAN) 10 MG tablet Take 2 tablets (20 mg total) by mouth 4 (four) times daily -  before meals and at bedtime. Patient not taking: Reported on 03/13/2018 02/28/18   Volanda Napoleon, MD     Vital Signs: BP (!) 110/50 (BP Location: Right Arm)   Pulse 73   Temp 99 F (37.2 C) (Oral)   Resp 19   Ht 6' (1.829 m)   Wt 208 lb 8.9 oz (94.6 kg)   SpO2 95%   BMI 28.29 kg/m   Physical Exam biliary drain intact, insertion site ok, mildly tender, output about 200 cc green bile in bag  Imaging: Dg Chest Port 1 View  Result Date: 03/19/2018 CLINICAL DATA:  63 year old male with shortness of breath. Gastric cancer. Recently placed internal external biliary drain. EXAM: PORTABLE CHEST 1 VIEW COMPARISON:  Chest radiographs 09/16/2016. FINDINGS: Portable AP semi upright view at 1627 hours. Right chest IJ approach power port in place and accessed. Normal cardiac size and mediastinal contours. Visualized tracheal air column is within normal limits. No pneumothorax, pulmonary  edema or pleural effusion. Mild lung base atelectasis greater on the left. Right upper quadrant catheter is partially visible. Paucity of bowel gas in the upper abdomen. No acute osseous abnormality identified. IMPRESSION: Mild atelectasis, otherwise no acute cardiopulmonary abnormality. Electronically Signed   By: Genevie Ann M.D.   On: 03/19/2018 16:58   Ir Biliary Drain Placement With Cholangiogram  Result Date: 03/17/2018 INDICATION: Gastric cancer, obstructive jaundice, failed ERCP EXAM: ULTRASOUND AND FLUOROSCOPIC  RIGHT INTERNAL EXTERNAL 10 FRENCH BILIARY DRAIN MEDICATIONS: 3.375 G ZOSYN; The antibiotic was administered within an appropriate time frame prior to the initiation of the procedure. ANESTHESIA/SEDATION: Moderate (conscious) sedation was employed during this procedure. A total of Versed 6.0 mg and Fentanyl 300 mcg was administered intravenously. Moderate Sedation Time: 43 minutes. The patient's level of consciousness and vital signs were monitored continuously by radiology nursing throughout the procedure under my direct supervision. FLUOROSCOPY TIME:  Fluoroscopy Time: 5 minutes 48 seconds (124 mGy). COMPLICATIONS: None immediate. PROCEDURE: Informed written consent was obtained from the patient after a thorough discussion of the procedural risks, benefits and alternatives. All questions were addressed. Maximal Sterile Barrier Technique was utilized including caps, mask, sterile gowns, sterile gloves, sterile drape, hand hygiene and skin antiseptic. A timeout was performed prior to the initiation of the procedure. Previous imaging reviewed. Preliminary ultrasound performed. The right hepatic lobe was localized in the mid axillary line through a lower intercostal space. Overlying skin marked. 21 gauge access needle was advanced into the liver under ultrasound. Approximately 5 passes made through the liver. Eventually a peripheral hepatic duct was opacified. Limited cholangiogram performed. Proximal CBD obstruction confirmed. Images obtained for documentation. 21 gauge needle adjusted to access the peripheral right hepatic duct more securely. This was done under rotational fluoroscopy. 018 guidewire advanced into the bile duct system easily. Accustick dilator exchange performed. Amplatz guidewire advanced into the duodenum. Tract dilatation performed to insert a 10 [place] internal external biliary drain. Retention loop formed in the duodenum. Contrast injection confirms position and adequate biliary drainage through  the proximal CBD obstruction. Catheter secured with Prolene suture and connected to external gravity drainage. Sterile dressing applied. No immediate complication. Patient tolerated the procedure well. IMPRESSION: Successful ultrasound and fluoroscopic right 10 French internal external biliary drain. Electronically Signed   By: Jerilynn Mages.  Shick M.D.   On: 03/17/2018 12:19    Labs:  CBC: Recent Labs    03/17/18 0556 03/18/18 0513 03/19/18 0544 03/20/18 0400  WBC 4.6 5.5 6.6 10.2  HGB 8.9* 9.2* 9.3* 8.5*  HCT 28.8* 28.8* 29.3* 26.0*  PLT 382 411* 390 314    COAGS: Recent Labs    03/04/18 1220 03/16/18 0807  INR 1.00 1.31    BMP: Recent Labs    03/17/18 0556 03/18/18 0513 03/19/18 0544 03/20/18 0400  NA 138 136 136 135  K 3.4* 3.3* 3.7 3.8  CL 106 102 101 100  CO2 22 23 25 24   GLUCOSE 106* 106* 124* 132*  BUN 7* 7* 8 10  CALCIUM 8.3* 8.3* 8.3* 8.1*  CREATININE 0.64 0.55* 0.60* 0.64  GFRNONAA >60 >60 >60 >60  GFRAA >60 >60 >60 >60    LIVER FUNCTION TESTS: Recent Labs    03/17/18 0556 03/18/18 0513 03/19/18 0544 03/20/18 0400  BILITOT 8.9* 10.0* 10.5* 12.5*  AST 97* 103* 81* 87*  ALT 138* 146* 126* 121*  ALKPHOS 276* 290* 286* 269*  PROT 5.8* 6.0* 5.8* 5.5*  ALBUMIN 2.5* 2.5* 2.5* 2.2*    Assessment and Plan: Pt  with hx gastric cancer (adenoca)/biliary obstruction; s/p I/E biliary drain 1/13; afebrile; WBC nl; hgb 8.5(9.3), creat nl; t bili 12.5(10.5), other LFT's sl lower; blood cx pend(gm neg rods); check bile cx/cytology; pt scheduled for f/u cholangiogram today; d/w pt/family; other plans as per GI/CCS/IM   Electronically Signed: D. Rowe Robert, PA-C 03/20/2018, 2:40 PM   I spent a total of 15 minutes at the the patient's bedside AND on the patient's hospital floor or unit, greater than 50% of which was counseling/coordinating care for biliary drain    Patient ID: Fawn Kirk, male   DOB: [birth year], 63 y.o.   MRN: [id number]

## 2018-03-21 DIAGNOSIS — E43 Unspecified severe protein-calorie malnutrition: Secondary | ICD-10-CM

## 2018-03-21 LAB — COMPREHENSIVE METABOLIC PANEL
ALT: 98 U/L — ABNORMAL HIGH (ref 0–44)
AST: 66 U/L — ABNORMAL HIGH (ref 15–41)
Albumin: 2.1 g/dL — ABNORMAL LOW (ref 3.5–5.0)
Alkaline Phosphatase: 218 U/L — ABNORMAL HIGH (ref 38–126)
Anion gap: 12 (ref 5–15)
BUN: 16 mg/dL (ref 8–23)
CO2: 23 mmol/L (ref 22–32)
Calcium: 8 mg/dL — ABNORMAL LOW (ref 8.9–10.3)
Chloride: 100 mmol/L (ref 98–111)
Creatinine, Ser: 0.82 mg/dL (ref 0.61–1.24)
GFR calc Af Amer: >60 mL/min (ref >60)
GFR calc non Af Amer: >60 mL/min (ref >60)
GLUCOSE: 134 mg/dL — AB (ref 70–99)
Potassium: 3.8 mmol/L (ref 3.5–5.1)
Sodium: 135 mmol/L (ref 135–145)
Total Bilirubin: 12.6 mg/dL — ABNORMAL HIGH (ref 0.3–1.2)
Total Protein: 5.5 g/dL — ABNORMAL LOW (ref 6.5–8.1)

## 2018-03-21 LAB — ABO/RH: ABO/RH(D): A POS

## 2018-03-21 LAB — CBC WITH DIFFERENTIAL/PLATELET
Abs Immature Granulocytes: 0.61 10*3/uL — ABNORMAL HIGH (ref 0.00–0.07)
Basophils Absolute: 0 10*3/uL (ref 0.0–0.1)
Basophils Relative: 0 %
Eosinophils Absolute: 0 10*3/uL (ref 0.0–0.5)
Eosinophils Relative: 0 %
HCT: 26 % — ABNORMAL LOW (ref 39.0–52.0)
Hemoglobin: 8.3 g/dL — ABNORMAL LOW (ref 13.0–17.0)
IMMATURE GRANULOCYTES: 6 %
LYMPHS PCT: 2 %
Lymphs Abs: 0.2 10*3/uL — ABNORMAL LOW (ref 0.7–4.0)
MCH: 27.5 pg (ref 26.0–34.0)
MCHC: 31.9 g/dL (ref 30.0–36.0)
MCV: 86.1 fL (ref 80.0–100.0)
Monocytes Absolute: 0.5 10*3/uL (ref 0.1–1.0)
Monocytes Relative: 5 %
Neutro Abs: 9.2 10*3/uL — ABNORMAL HIGH (ref 1.7–7.7)
Neutrophils Relative %: 87 %
PLATELETS: 271 10*3/uL (ref 150–400)
RBC: 3.02 MIL/uL — ABNORMAL LOW (ref 4.22–5.81)
RDW: 16.9 % — ABNORMAL HIGH (ref 11.5–15.5)
WBC: 10.6 10*3/uL — ABNORMAL HIGH (ref 4.0–10.5)
nRBC: 0 % (ref 0.0–0.2)

## 2018-03-21 LAB — PREALBUMIN: Prealbumin: 10.1 mg/dL — ABNORMAL LOW (ref 18–38)

## 2018-03-21 LAB — PREPARE RBC (CROSSMATCH)

## 2018-03-21 LAB — PROCALCITONIN: Procalcitonin: 91.09 ng/mL

## 2018-03-21 MED ORDER — FUROSEMIDE 10 MG/ML IJ SOLN
20.0000 mg | Freq: Once | INTRAMUSCULAR | Status: AC
  Administered 2018-03-21: 20 mg via INTRAVENOUS

## 2018-03-21 MED ORDER — GENTAMICIN SULFATE 40 MG/ML IJ SOLN
560.0000 mg | INTRAVENOUS | Status: DC
  Administered 2018-03-21: 560 mg via INTRAVENOUS
  Filled 2018-03-21: qty 14

## 2018-03-21 MED ORDER — SODIUM CHLORIDE 0.9% IV SOLUTION: Freq: Once | INTRAVENOUS | Status: DC

## 2018-03-21 MED ORDER — SODIUM CHLORIDE 0.9 % IV BOLUS
500.0000 mL | Freq: Once | INTRAVENOUS | Status: AC
  Administered 2018-03-21: 500 mL via INTRAVENOUS

## 2018-03-21 MED ORDER — FUROSEMIDE 10 MG/ML IJ SOLN
20.0000 mg | Freq: Once | INTRAMUSCULAR | Status: DC
  Filled 2018-03-21: qty 2

## 2018-03-21 MED ORDER — ENSURE ENLIVE PO LIQD
237.0000 mL | Freq: Three times a day (TID) | ORAL | Status: DC
  Administered 2018-03-21 – 2018-03-24 (×5): 237 mL via ORAL

## 2018-03-21 NOTE — Progress Notes (Signed)
[facility] Progress Note    Since last GI note: IR PTC drain check yesterday and then CT overnight all noted. 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  He is rather constantly nauseas however.  Objective: Vital signs in last 24 hours: Temp:  [97.7 F (36.5 C)-99.7 F (37.6 C)] 98.1 F (36.7 C) (01/17 0741) Pulse Rate:  [70-108] 71 (01/17 0741) Resp:  [16] 16 (01/17 0413) BP: (85-105)/(62-69) 97/63 (01/17 0741) SpO2:  [95 %-96 %] 96 % (01/17 0741) Weight:  [89.4 kg] 89.4 kg (01/17 0500) Last BM Date: 03/18/18 General: alert and oriented times 3 Heart: regular rate and rythm Abdomen: soft, non-tender, non-distended, normal bowel sounds   Lab Results: Recent Labs    03/19/18 0544 03/20/18 0400 03/21/18 0409  WBC 6.6 10.2 10.6*  HGB 9.3* 8.5* 8.3*  PLT 390 314 271  MCV 84.4 84.7 86.1   Recent Labs    03/19/18 0544 03/20/18 0400 03/21/18 0409  NA 136 135 135  K 3.7 3.8 3.8  CL 101 100 100  CO2 '25 24 23  '$ GLUCOSE 124* 132* 134*  BUN '8 10 16  '$ CREATININE 0.60* 0.64 0.82  CALCIUM 8.3* 8.1* 8.0*   Recent Labs    03/19/18 0544 03/20/18 0400 03/21/18 0409  PROT 5.8* 5.5* 5.5*  ALBUMIN 2.5* 2.2* 2.1*  AST 81* 87* 66*  ALT 126* 121* 98*  ALKPHOS 286* 269* 218*  BILITOT 10.5* 12.5* 12.6*    Studies/Results: Ct Abdomen Pelvis W Contrast  Result Date: 03/20/2018 CLINICAL DATA:  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 Increasing LFTs. Evaluate for biliary dilatation and/or liver abscess. EXAM: CT ABDOMEN AND PELVIS WITH CONTRAST TECHNIQUE: Multidetector CT imaging of the abdomen and pelvis was performed using the standard protocol following bolus administration of intravenous contrast. CONTRAST:  100 mL Isovue 300 IV COMPARISON:  MRI abdomen dated 03/14/2018 FINDINGS: Lower chest: Trace bilateral pleural effusions. Bilateral lower lobe atelectasis, right  greater than left. Hepatobiliary: Liver is grossly unremarkable. No suspicious/enhancing hepatic lesions. No evidence of liver abscess. Stable nodular soft tissue along the inferior wall of the gallbladder fundus (series 2/image 38), non FDG avid on PET. No intrahepatic or extrahepatic ductal dilatation status post external biliary drain placement, terminating in the distal 2nd/proximal 3rd portion of the duodenum. Pancreas: No pancreatic mass, atrophy, or ductal dilatation. Mild stranding along the anterior aspect of the pancreatic body/tail, unchanged. Spleen: Within normal limits. Adrenals/Urinary Tract: Adrenal glands are within normal limits. Multiple bilateral nonobstructing renal calculi, measuring up to 5 mm in the anterior right upper kidney (series 2/image 49). No hydronephrosis. Bladder is within normal limits. Stomach/Bowel: Infiltrating masslike thickening of the distal stomach (series 2/image 38). Surrounding mesenteric stranding inferiorly (series 2/image 41). No evidence of bowel obstruction. Appendix is not discretely visualized. Left colonic diverticulosis, without evidence of diverticulitis. Vascular/Lymphatic: No evidence of abdominal aortic aneurysm. Atherosclerotic calcifications of the abdominal aorta and branch vessels. No suspicious abdominopelvic lymphadenopathy. Reproductive: Prostate is unremarkable. Other: Moderate abdominopelvic ascites.  No free air. Mild fat in the right inguinal canal. Musculoskeletal: Status post PLIF at L1-5. IMPRESSION: No residual intrahepatic/extrahepatic ductal dilatation status post external biliary drain placement. No evidence of liver lesion/abscess. Infiltrating masslike thickening of the distal stomach, unchanged. Additional stable ancillary findings as above. Electronically Signed   By: Julian Hy M.D.   On: 03/20/2018 22:24   Dg Chest Port 1 View  Result Date: 03/19/2018 CLINICAL DATA:  63 year old  male with shortness of breath. Gastric  cancer. Recently placed internal external biliary drain. EXAM: PORTABLE CHEST 1 VIEW COMPARISON:  Chest radiographs 09/16/2016. FINDINGS: Portable AP semi upright view at 1627 hours. Right chest IJ approach power port in place and accessed. Normal cardiac size and mediastinal contours. Visualized tracheal air column is within normal limits. No pneumothorax, pulmonary edema or pleural effusion. Mild lung base atelectasis greater on the left. Right upper quadrant catheter is partially visible. Paucity of bowel gas in the upper abdomen. No acute osseous abnormality identified. IMPRESSION: Mild atelectasis, otherwise no acute cardiopulmonary abnormality. Electronically Signed   By: Genevie Ann M.D.   On: 03/19/2018 16:58   Ir Cholangiogram Existing Tube  Result Date: 03/20/2018 INDICATION: Biliary obstruction, status post internal/external biliary drain catheter placement 04/03/2018. Persistent elevation of bilirubin. Assess drain function. EXAM: CHOLANGIOGRAM THROUGH EXISTING CATHETER MEDICATIONS: NONE INDICATED ANESTHESIA/SEDATION: None required FLUOROSCOPY TIME:  0.8 minute; 007  uGym2 DAP COMPLICATIONS: None immediate. PROCEDURE: Informed written consent was obtained from the patient after a thorough discussion of the procedural risks, benefits and alternatives. All questions were addressed. A timeout was performed prior to the initiation of the procedure. Scout radiographic image obtained. This demonstrated stable position of the internal/external biliary drain catheter compared to placement images from 03/17/2018. Contrast was injected through the catheter. This opacified left biliary tree and portion of the right biliary tree. The inferior right intrahepatic biliary tree was not opacified, despite changes in patient positioning. Catheter is patent into the duodenum. The duodenum is decompressed and unremarkable. The catheter was flushed and returned to external drainage. IMPRESSION: 1. Internal/external biliary  drain catheter remains in stable position, patent, with decompression of visualized intrahepatic biliary ducts. 2. Nonvisualization of portions of the right biliary tree suggesting possibility of central intrahepatic segmental biliary obstruction. MRCP may be useful for further evaluation. Findings were reviewed with Dr. Ardis Hughs. Electronically Signed   By: Lucrezia Europe M.D.   On: 03/20/2018 15:33     Medications: Scheduled Meds: . ALPRAZolam  1 mg Oral QHS  . alum & mag hydroxide-simeth  30 mL Oral Q8H  . buPROPion  150 mg Oral BID  . citalopram  40 mg Oral Daily  . cyclobenzaprine  10 mg Oral QHS  . heparin injection (subcutaneous)  5,000 Units Subcutaneous Q8H  . iopamidol      . lipase/protease/amylase  36,000 Units Oral TID AC  . pantoprazole  40 mg Oral BID  . polyethylene glycol  17 g Oral BID  . senna-docusate  2 tablet Oral BID  . sodium chloride (PF)      . sodium chloride flush  10-40 mL Intracatheter Q12H  . sodium chloride flush  5 mL Intracatheter Q8H   Continuous Infusions: . sodium chloride Stopped (03/19/18 1751)  . ondansetron (ZOFRAN) IV    . piperacillin-tazobactam (ZOSYN)  IV 3.375 g (03/21/18 0006)   PRN Meds:.acetaminophen, acetaminophen, ibuprofen, LORazepam, morphine injection, nitroGLYCERIN, ondansetron (ZOFRAN) IV **OR** ondansetron (ZOFRAN) IV, simethicone, sodium chloride flush    Assessment/Plan: 63 y.o. male with locally advanced gastric cancer causing biliary obstruction and partial gastric outlet obstruction.  Unfortunately and surprisingly the CT scan does not show clear cause for his persistently rising bilirubin, fevers, E coli bacteremia. Perhaps the lfts will start to decrease with further time, continued Abx.   Partial gastric outlet obstruction. He is not vomiting and stomach does not appear obstructed on CT last night.  I recommend he push full, nutritional liquids rather than start TNA.  I  explained an ensure/boost milk concoction that is much  more palatable than ensure or boost alone and he is determined to try it.  He and his wife are interested in transfer to [facility].  Hospitalist to help coordinate.  While still here should push nutrition and continue IV abx.   Milus Banister, MD  03/21/2018, 7:44 AM [facility] Pager [phone]

## 2018-03-21 NOTE — Progress Notes (Signed)
PROGRESS NOTE    Steven Ortega  EVO:[id number] DOB: 1955/05/29 DOA: 03/13/2018 PCP: Christain Sacramento, MD   Brief Narrative:  63 year old with past medical history relevant for recent diagnosis of gastric cancer complicated by gastric outlet obstruction, hypothyroidism, BPH, pancreatic insufficiency, depression/anxiety who presented with extrinsic compression of biliary duct due to progression of gastric cancer status post failed ERCP and IR guided biliary placement on 03/17/2018.   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 coli sepsis from a biliary source: Likely secondary to biliary source - Continue IV Zosyn started 03/19/2018 -Blood cultures from 03/19/2018 growing out 2 out of 2 E. coli, pending susceptibilities -Follow-up biliary bile culture  #) Elevated LFTs/hyperbilirubinemia due to extrinsic compression of CBD by gastric cancer: Patient had cholangiogram through biliary stent which showed that it was good can be position.  Repeat CT of the abdomen pelvis showed that his biliary system had been decompressed.  Bilirubin continues to slowly rise however remaining LFTs are downtrending -Interventional radiology following, appreciate recommendations, biliary drain placed on 03/17/2018 -General surgery consult, pending recommendations -Oncology following, appreciate recommendations -Family is requesting transfer to [facility] which oncology agrees with  #) Gastric cancer complicated by gastric outlet obstruction: Gastroenterology has seen the patient and feels that he should only be on a full liquid diet and not advance at this time until he gets therapy for the gastric cancer -Full liquid diet, do not advance, prealbumin is quite low and patient might need TPN  #) Pancreatic insufficiency/slow motility: -Continue  Creon with meals  #) Pain/psych: -Continue alprazolam 1 mg nightly -Continue bupropion 150 mg twice daily -Continue citalopram 40 mg daily -Continue PRN cyclobenzaprine  #) BPH: -Continue tamsulosin 0.4 mg daily  #) Hypothyroidism: -Continue levothyroxine 25 mcg every morning  Fluids: Tolerating p.o. Electrolytes: Monitor and supplement Nutrition: Full liquid diet  Prophylaxis: SCDs  Disposition: Pending resolution of biliary obstruction  Full code  Consultants:   Gastroenterology  Oncology, Dr. Marin Olp  Interventional radiology  Procedures:   03/15/2018 ERCP: Aborted due to significant stomach contents  03/17/2018 IR guided biliary drain placement  Antimicrobials:   None   Subjective: This morning patient continues to report pain in his abdomen as well as some radiating chest pain to his block.  He denies any nausea or vomiting but reports his appetite is quite poor.  After manipulation of his biliary drain he had some chills yesterday.  Objective: Vitals:   03/21/18 0413 03/21/18 0500 03/21/18 0615 03/21/18 0741  BP: (!) 85/64  90/62 97/63  Pulse: 73  70 71  Resp: 16     Temp: 97.7 F (36.5 C)   98.1 F (36.7 C)  TempSrc: Oral   Oral  SpO2: 96%   96%  Weight:  89.4 kg    Height:        Intake/Output Summary (Last 24 hours) at 03/21/2018 1227 Last data filed at 03/21/2018 1015 Gross per 24 hour  Intake -  Output 250 ml  Net -250 ml   Filed Weights   03/17/18 0550 03/18/18 0405 03/21/18 0500  Weight: 95 kg 94.6 kg 89.4 kg    Examination:  General exam: Appears calm mildly uncomfortable Respiratory system: Clear to auscultation. Respiratory effort normal. Cardiovascular system: Regular rate and rhythm, no murmurs Gastrointestinal system: Soft, moderately tender in right upper  quadrant, no rebound or guarding, plus bowel sounds. Central nervous system: Alert and oriented.  Grossly intact, moving all extremities Extremities: Trace lower  extremity edema. Skin: Grossly icteric, percutaneous biliary drain site is clean dry and intact, port site is clean dry and intact Psychiatry: Judgement and insight appear normal. Mood & affect appropriate.     Data Reviewed: I have personally reviewed following labs and imaging studies  CBC: Recent Labs  Lab 03/17/18 0556 03/18/18 0513 03/19/18 0544 03/20/18 0400 03/21/18 0409  WBC 4.6 5.5 6.6 10.2 10.6*  NEUTROABS  --   --   --   --  9.2*  HGB 8.9* 9.2* 9.3* 8.5* 8.3*  HCT 28.8* 28.8* 29.3* 26.0* 26.0*  MCV 87.5 86.2 84.4 84.7 86.1  PLT 382 411* 390 314 481   Basic Metabolic Panel: Recent Labs  Lab 03/17/18 0556 03/18/18 0513 03/19/18 0544 03/20/18 0400 03/21/18 0409  NA 138 136 136 135 135  K 3.4* 3.3* 3.7 3.8 3.8  CL 106 102 101 100 100  CO2 22 23 25 24 23   GLUCOSE 106* 106* 124* 132* 134*  BUN 7* 7* 8 10 16   CREATININE 0.64 0.55* 0.60* 0.64 0.82  CALCIUM 8.3* 8.3* 8.3* 8.1* 8.0*  MG  --   --   --  2.2  --    GFR: Estimated Creatinine Clearance: 99.9 mL/min (by C-G formula based on SCr of 0.82 mg/dL). Liver Function Tests: Recent Labs  Lab 03/17/18 0556 03/18/18 0513 03/19/18 0544 03/20/18 0400 03/21/18 0409  AST 97* 103* 81* 87* 66*  ALT 138* 146* 126* 121* 98*  ALKPHOS 276* 290* 286* 269* 218*  BILITOT 8.9* 10.0* 10.5* 12.5* 12.6*  PROT 5.8* 6.0* 5.8* 5.5* 5.5*  ALBUMIN 2.5* 2.5* 2.5* 2.2* 2.1*   No results for input(s): LIPASE, AMYLASE in the last 168 hours. No results for input(s): AMMONIA in the last 168 hours. Coagulation Profile: Recent Labs  Lab 03/16/18 0807  INR 1.31   Cardiac Enzymes: Recent Labs  Lab 03/19/18 1525 03/19/18 2226 03/20/18 0400  TROPONINI <0.03 <0.03 <0.03   BNP (last 3 results) No results for input(s): PROBNP in the last 8760 hours. HbA1C: No results for input(s): HGBA1C in the last 72 hours. CBG: No results for input(s): GLUCAP in the last 168 hours. Lipid Profile: No results for input(s): CHOL, HDL,  LDLCALC, TRIG, CHOLHDL, LDLDIRECT in the last 72 hours. Thyroid Function Tests: No results for input(s): TSH, T4TOTAL, FREET4, T3FREE, THYROIDAB in the last 72 hours. Anemia Panel: No results for input(s): VITAMINB12, FOLATE, FERRITIN, TIBC, IRON, RETICCTPCT in the last 72 hours. Sepsis Labs: Recent Labs  Lab 03/19/18 1525 03/20/18 0400 03/21/18 0409  PROCALCITON 0.27 20.57 91.09    Recent Results (from the past 240 hour(s))  Culture, blood (routine x 2)     Status: None (Preliminary result)   Collection Time: 03/19/18  3:24 PM  Result Value Ref Range Status   Specimen Description   Final    BLOOD RIGHT ARM Performed at [facility] [address], [place], [place] [address]    Special Requests   Final    BOTTLES DRAWN AEROBIC AND ANAEROBIC Blood Culture adequate volume Performed at [facility] [address] [place], [place] [address]    Culture  Setup Time   Final    GRAM NEGATIVE RODS IN BOTH AEROBIC AND ANAEROBIC BOTTLES CRITICAL VALUE NOTED.  VALUE IS CONSISTENT WITH PREVIOUSLY REPORTED AND CALLED VALUE. Performed at [facility],  [address], [place], [place] [address]    Culture GRAM NEGATIVE RODS  Final   Report Status PENDING  Incomplete  Culture, blood (routine x 2)     Status: Abnormal (Preliminary result)   Collection Time: 03/19/18  3:33 PM  Result Value Ref Range Status   Specimen Description   Final    BLOOD RIGHT HAND Performed at [facility] [address] [place], [place] [address]    Special Requests   Final    BOTTLES DRAWN AEROBIC AND ANAEROBIC Blood Culture adequate volume Performed at [facility] [address] [place], [place] [address]    Culture  Setup Time   Final    GRAM NEGATIVE RODS IN BOTH AEROBIC AND ANAEROBIC BOTTLES CRITICAL RESULT CALLED TO, READ BACK BY AND VERIFIED WITH: PHARMD Melodye Ped [id number] MLM    Culture (A)  Final     ESCHERICHIA COLI SUSCEPTIBILITIES TO FOLLOW Performed at [facility], [place] [address] [place], [place] [address]    Report Status PENDING  Incomplete  Blood Culture ID Panel (Reflexed)     Status: Abnormal   Collection Time: 03/19/18  3:33 PM  Result Value Ref Range Status   Enterococcus species NOT DETECTED NOT DETECTED Final   Listeria monocytogenes NOT DETECTED NOT DETECTED Final   Staphylococcus species NOT DETECTED NOT DETECTED Final   Staphylococcus aureus (BCID) NOT DETECTED NOT DETECTED Final   Streptococcus species NOT DETECTED NOT DETECTED Final   Streptococcus agalactiae NOT DETECTED NOT DETECTED Final   Streptococcus pneumoniae NOT DETECTED NOT DETECTED Final   Streptococcus pyogenes NOT DETECTED NOT DETECTED Final   Acinetobacter baumannii NOT DETECTED NOT DETECTED Final   Enterobacteriaceae species DETECTED (A) NOT DETECTED Final    Comment: Enterobacteriaceae represent a large family of gram-negative bacteria, not a single organism. CRITICAL RESULT CALLED TO, READ BACK BY AND VERIFIED WITH: PHARMD J GADHIA [id number] MLM    Enterobacter cloacae complex NOT DETECTED NOT DETECTED Final   Escherichia coli DETECTED (A) NOT DETECTED Final    Comment: CRITICAL RESULT CALLED TO, READ BACK BY AND VERIFIED WITH: PHARMD J GADHIA [id number] MLM    Klebsiella oxytoca NOT DETECTED NOT DETECTED Final   Klebsiella pneumoniae NOT DETECTED NOT DETECTED Final   Proteus species NOT DETECTED NOT DETECTED Final   Serratia marcescens NOT DETECTED NOT DETECTED Final   Carbapenem resistance NOT DETECTED NOT DETECTED Final   Haemophilus influenzae NOT DETECTED NOT DETECTED Final   Neisseria meningitidis NOT DETECTED NOT DETECTED Final   Pseudomonas aeruginosa NOT DETECTED NOT DETECTED Final   Candida albicans NOT DETECTED NOT DETECTED Final   Candida glabrata NOT DETECTED NOT DETECTED Final   Candida krusei NOT DETECTED NOT DETECTED Final   Candida parapsilosis NOT DETECTED NOT  DETECTED Final   Candida tropicalis NOT DETECTED NOT DETECTED Final    Comment: Performed at [facility], [place] [address], [place], [place] [address]         Radiology Studies: Ct Abdomen Pelvis W Contrast  Result Date: 03/20/2018 CLINICAL DATA:  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 Increasing LFTs. Evaluate for biliary dilatation and/or liver abscess. EXAM: CT ABDOMEN AND PELVIS WITH CONTRAST TECHNIQUE: Multidetector CT imaging of the abdomen and pelvis was performed using the standard protocol following bolus administration of intravenous contrast. CONTRAST:  100 mL Isovue 300 IV COMPARISON:  MRI abdomen dated 03/14/2018 FINDINGS: Lower chest: Trace bilateral pleural effusions. Bilateral lower  lobe atelectasis, right greater than left. Hepatobiliary: Liver is grossly unremarkable. No suspicious/enhancing hepatic lesions. No evidence of liver abscess. Stable nodular soft tissue along the inferior wall of the gallbladder fundus (series 2/image 38), non FDG avid on PET. No intrahepatic or extrahepatic ductal dilatation status post external biliary drain placement, terminating in the distal 2nd/proximal 3rd portion of the duodenum. Pancreas: No pancreatic mass, atrophy, or ductal dilatation. Mild stranding along the anterior aspect of the pancreatic body/tail, unchanged. Spleen: Within normal limits. Adrenals/Urinary Tract: Adrenal glands are within normal limits. Multiple bilateral nonobstructing renal calculi, measuring up to 5 mm in the anterior right upper kidney (series 2/image 49). No hydronephrosis. Bladder is within normal limits. Stomach/Bowel: Infiltrating masslike thickening of the distal stomach (series 2/image 38). Surrounding mesenteric stranding inferiorly (series 2/image 41). No evidence of bowel obstruction. Appendix is not discretely visualized. Left colonic diverticulosis, without evidence of diverticulitis. Vascular/Lymphatic: No evidence  of abdominal aortic aneurysm. Atherosclerotic calcifications of the abdominal aorta and branch vessels. No suspicious abdominopelvic lymphadenopathy. Reproductive: Prostate is unremarkable. Other: Moderate abdominopelvic ascites.  No free air. Mild fat in the right inguinal canal. Musculoskeletal: Status post PLIF at L1-5. IMPRESSION: No residual intrahepatic/extrahepatic ductal dilatation status post external biliary drain placement. No evidence of liver lesion/abscess. Infiltrating masslike thickening of the distal stomach, unchanged. Additional stable ancillary findings as above. Electronically Signed   By: Julian Hy M.D.   On: 03/20/2018 22:24   Dg Chest Port 1 View  Result Date: 03/19/2018 CLINICAL DATA:  63 year old male with shortness of breath. Gastric cancer. Recently placed internal external biliary drain. EXAM: PORTABLE CHEST 1 VIEW COMPARISON:  Chest radiographs 09/16/2016. FINDINGS: Portable AP semi upright view at 1627 hours. Right chest IJ approach power port in place and accessed. Normal cardiac size and mediastinal contours. Visualized tracheal air column is within normal limits. No pneumothorax, pulmonary edema or pleural effusion. Mild lung base atelectasis greater on the left. Right upper quadrant catheter is partially visible. Paucity of bowel gas in the upper abdomen. No acute osseous abnormality identified. IMPRESSION: Mild atelectasis, otherwise no acute cardiopulmonary abnormality. Electronically Signed   By: Genevie Ann M.D.   On: 03/19/2018 16:58   Ir Cholangiogram Existing Tube  Result Date: 03/20/2018 INDICATION: Biliary obstruction, status post internal/external biliary drain catheter placement 04/03/2018. Persistent elevation of bilirubin. Assess drain function. EXAM: CHOLANGIOGRAM THROUGH EXISTING CATHETER MEDICATIONS: NONE INDICATED ANESTHESIA/SEDATION: None required FLUOROSCOPY TIME:  0.8 minute; 412  uGym2 DAP COMPLICATIONS: None immediate. PROCEDURE: Informed written  consent was obtained from the patient after a thorough discussion of the procedural risks, benefits and alternatives. All questions were addressed. A timeout was performed prior to the initiation of the procedure. Scout radiographic image obtained. This demonstrated stable position of the internal/external biliary drain catheter compared to placement images from 03/17/2018. Contrast was injected through the catheter. This opacified left biliary tree and portion of the right biliary tree. The inferior right intrahepatic biliary tree was not opacified, despite changes in patient positioning. Catheter is patent into the duodenum. The duodenum is decompressed and unremarkable. The catheter was flushed and returned to external drainage. IMPRESSION: 1. Internal/external biliary drain catheter remains in stable position, patent, with decompression of visualized intrahepatic biliary ducts. 2. Nonvisualization of portions of the right biliary tree suggesting possibility of central intrahepatic segmental biliary obstruction. MRCP may be useful for further evaluation. Findings were reviewed with Dr. Ardis Hughs. Electronically Signed   By: Lucrezia Europe M.D.   On: 03/20/2018 15:33  Scheduled Meds: . sodium chloride   Intravenous Once  . ALPRAZolam  1 mg Oral QHS  . alum & mag hydroxide-simeth  30 mL Oral Q8H  . buPROPion  150 mg Oral BID  . citalopram  40 mg Oral Daily  . cyclobenzaprine  10 mg Oral QHS  . feeding supplement (ENSURE ENLIVE)  237 mL Oral TID  . furosemide  20 mg Intravenous Once  . furosemide  20 mg Intravenous Once  . heparin injection (subcutaneous)  5,000 Units Subcutaneous Q8H  . lipase/protease/amylase  36,000 Units Oral TID AC  . pantoprazole  40 mg Oral BID  . polyethylene glycol  17 g Oral BID  . senna-docusate  2 tablet Oral BID  . sodium chloride flush  10-40 mL Intracatheter Q12H  . sodium chloride flush  5 mL Intracatheter Q8H   Continuous Infusions: . sodium chloride Stopped  (03/19/18 1751)  . gentamicin    . ondansetron (ZOFRAN) IV    . piperacillin-tazobactam (ZOSYN)  IV 3.375 g (03/21/18 0813)     LOS: 8 days    Time spent: 83    Cristy Folks, MD [facility]  If 7PM-7AM, please contact night-coverage [email or web address] Password [facility] 03/21/2018, 12:27 PM

## 2018-03-21 NOTE — Progress Notes (Signed)
Pharmacy Antibiotic Note  Steven Ortega is a 63 y.o. male diagnosis of gastric cancer complicated by gastric outlet obstruction admitted on 03/13/2018 s/p failed ERCP and IR guided biliary placement on 03/17/2018. Blood cultures growing E.coli and started on Zosyn; 1/17 Pharmacy has been consulted to add gentamicin.  Plan:  Begin gentamicin 7mg /kg IV q24h  Check random gentamicin level 10hr after dose and adjust interval per [place] nomogram  Continue Zosyn per MD  Follow up renal function & cultures  Height: 6' (182.9 cm) Weight: 197 lb (89.4 kg) IBW/kg (Calculated) : 77.6  Temp (24hrs), Avg:98.5 F (36.9 C), Min:97.7 F (36.5 C), Max:99.7 F (37.6 C)  Recent Labs  Lab 03/17/18 0556 03/18/18 0513 03/19/18 0544 03/20/18 0400 03/21/18 0409  WBC 4.6 5.5 6.6 10.2 10.6*  CREATININE 0.64 0.55* 0.60* 0.64 0.82    Estimated Creatinine Clearance: 99.9 mL/min (by C-G formula based on SCr of 0.82 mg/dL).    No Known Allergies  Antimicrobials this admission: 1/13 Zosyn >>  Dose adjustments this admission: 1/17 gentamicin level = ___ (10hr after dose)  Microbiology results: 1/15 BCx: 4 of 4 bottles E.coli (sensitivities pending)  Thank you for allowing pharmacy to be a part of this patient's care.  Peggyann Juba, PharmD, BCPS Pager: [phone] 03/21/2018 11:40 AM

## 2018-03-21 NOTE — Progress Notes (Signed)
Initial Nutrition Assessment  DOCUMENTATION CODES:   Severe malnutrition in context of chronic illness  INTERVENTION:    Ensure Enlive po TID, each supplement provides 350 kcal and 20 grams of protein  Magic cup TID with meals, each supplement provides 290 kcal and 9 grams of protein  MVI daily  NUTRITION DIAGNOSIS:   Severe Malnutrition related to cancer and cancer related treatments, chronic illness as evidenced by energy intake < or equal to 75% for > or equal to 1 month, percent weight loss, mild fat depletion, moderate muscle depletion.  GOAL:   Patient will meet greater than or equal to 90% of their needs MONITOR:   PO intake, Supplement acceptance, Weight trends, Labs, I & O's  REASON FOR ASSESSMENT:   Consult Assessment of nutrition requirement/status  ASSESSMENT:   Patient with PMH significant for recent diagnosis of gastric cancer complicated by gastric outlet obstruction, hypothyroidism, BPH, pancreatic insufficiency, and depression/anxiety. Presents this admission with sepsis from biliary source and elevated LFTs/hyperbilirubinemia due to extrinsic compression of CBD by gastric cancer.    1/13- failed ERCP and IR guided biliary placement   Pt endorses his PO intake decreased 1 month ago before he was diagnosed with gastric cancer. States he tried to eat soft foods like yogurt, cream of chicken soup, and jello. Any food item he attempted would sit in his stomach for a prolonged period and would cause intolerable pain. Reports being positive for taste changes and feelings of early satiety. He attempted to drink Ensure at home but had issues as it was too thick. Per GI, pt should remain on a liquid diet without advancement.   Discussed the importance of protein intake for preservation of lean body mass. Discussed mixing supplements with milk and to increase intake of these supplements as tolerated to meet needs. Pt drinking Ensure mixed with milk at bedside and states  they make him feel full if he drinks them too fast. Encouraged pt to sip slowly throughout the day but to stay consistent.   Pt reports his UBW stayed around 220-230 lb prior to his diagnosis. Records indicate pt weighed 223 lb on 02/07/18 and 197 lb this admission (11.7% wt loss in one month, significant for time frame). Nutrition-Focused physical exam completed.   Biliary drain: 250 ml x 24 hrs   Medications reviewed and include: 20 mg lasix, 36000 units creon with meals, miralax, zofran Labs reviewed.   NUTRITION - FOCUSED PHYSICAL EXAM:    Most Recent Value  Orbital Region  No depletion  Upper Arm Region  Moderate depletion  Thoracic and Lumbar Region  Unable to assess  Buccal Region  Mild depletion  Temple Region  Moderate depletion  Clavicle Bone Region  Moderate depletion  Clavicle and Acromion Bone Region  Moderate depletion  Scapular Bone Region  Unable to assess  Dorsal Hand  Mild depletion  Patellar Region  Moderate depletion  Anterior Thigh Region  Moderate depletion  Posterior Calf Region  Moderate depletion  Edema (RD Assessment)  None     Diet Order:   Diet Order            Diet full liquid Room service appropriate? Yes; Fluid consistency: Thin  Diet effective now              EDUCATION NEEDS:   Education needs have been addressed  Skin:  Skin Assessment: Reviewed RN Assessment  Last BM:  1/16  Height:   Ht Readings from Last 1 Encounters:  03/13/18 6' (1.829  m)    Weight:   Wt Readings from Last 1 Encounters:  03/21/18 89.4 kg    Ideal Body Weight:  80.9 kg  BMI:  Body mass index is 26.72 kg/m.  Estimated Nutritional Needs:   Kcal:  6484-7207 kcal  Protein:  120-135 grams  Fluid:  >/= 2.3 L/day   Mariana Single RD, LDN Clinical Nutrition Pager # - [phone]

## 2018-03-21 NOTE — Progress Notes (Signed)
Unfortunately, I just think that we are beginning to run into a lot of trouble.  Steven Ortega bilirubin is 12.6.  He had a CT scan done last night.  The CT scan does not show any intrahepatic or extrahepatic ductal dilatation.  He has no liver lesions.  He does have the masslike thickening of the distal stomach.  No adenopathy is appreciated.  He has some ascites.  His SGPT and SGOT are coming down.  His prealbumin is only 10.1.  This is quite troublesome to me.  I think he probably is going to need some type of parenteral nutrition if the gastroenterologist or surgeons cannot help open up his stomach.  Maybe, a stent can be put into his stomach/duodenum.  His hemoglobin is down to 8.3.  I suspect that he probably is going need to be transfused.  In talking to he and his wife this morning, they are not seriously considering trying to get him to 1 of the academic medical centers.  They were certainly not encouraged by what the surgeon told him yesterday.  From what they told me, the surgeon did not think that surgery to get out his cancer.  We are not a physician that we cannot treat him with chemotherapy given his bilirubin of 12.6.  Maybe, radiation therapy could work with infusional 5-FU as a radiosensitizer.  His pro calcitonin is 91.  He apparently had another reaction last night after the biliary stent was repositioned.  He now has gram-negative rods in his blood.  This has to be coming from his biliary track.  He is on Zosyn.  I think he might need some broader coverage.  Maybe, and aminoglycoside would help.  Maybe, Primaxin may not be a bad idea.  I just cannot imagine that when I first saw him that we would be running into this problem.  I just have a bad feeling that this tumor of his is incredibly aggressive for this to be causing these problems and his first cycle of chemotherapy in essence being totally ineffective.  Again, I think that he probably needs to be transfused.  Given  this gram-negative rod bacteremia, there is absolutely no way that he would be considered a surgical candidate.  Again, I will try to see about the ascites and see if there is malignancy in the ascites and also send the ascites off for culture.  Lattie Haw, MD [place] 10:23

## 2018-03-21 NOTE — Progress Notes (Signed)
Referring Physician(s): Darci Current  Supervising Physician: Markus Daft  Patient Status:  [facility] - In-pt  Chief Complaint: Gastric cancer, obstructive jaundice   Subjective: Pt still with fatigue; denies worsening abd pain/N/V; had some chills post drain injection yesterday; jaundice persists   Allergies: Patient has no known allergies.  Medications: Prior to Admission medications   Medication Sig Start Date End Date Taking? Authorizing Provider  ALPRAZolam Duanne Moron) 1 MG tablet Take 1 mg by mouth at bedtime.    Yes [provider]  buPROPion (ZYBAN) 150 MG 12 hr tablet Take 150 mg by mouth 2 (two) times daily.   Yes [provider]  citalopram (CELEXA) 40 MG tablet Take 40 mg by mouth daily.    Yes [provider]  cyclobenzaprine (FLEXERIL) 10 MG tablet Take 10 mg by mouth at bedtime.   Yes [provider]  docusate sodium (STOOL SOFTENER) 100 MG capsule Take 100 mg by mouth daily.    Yes [provider]  HYDROmorphone (DILAUDID) 2 MG tablet Take 1 tablet (2 mg total) by mouth every 4 (four) hours as needed for severe pain. 03/10/18  Yes Ennever, Rudell Cobb, MD  levothyroxine (SYNTHROID, LEVOTHROID) 25 MCG tablet Take 25 mcg by mouth daily before breakfast.   Yes [provider]  lidocaine-prilocaine (EMLA) cream Apply to affected area once 02/28/18  Yes Ennever, Rudell Cobb, MD  lipase/protease/amylase (CREON) 36000 UNITS CPEP capsule Take 2 capsules (72,000 Units total) by mouth 3 (three) times daily before meals. 02/28/18  Yes Ennever, Rudell Cobb, MD  ondansetron (ZOFRAN) 8 MG tablet Take 1 tablet (8 mg total) by mouth 2 (two) times daily as needed for refractory nausea / vomiting. Start on day 3 after chemotherapy. 02/28/18  Yes Volanda Napoleon, MD  pantoprazole (PROTONIX) 40 MG tablet Take 1 tablet (40 mg total) by mouth 2 (two) times daily before a meal. 02/06/18  Yes Esterwood, Amy S, PA-C  polyethylene glycol (MIRALAX / GLYCOLAX)  packet Take 17 g by mouth daily.   Yes [provider]  prochlorperazine (COMPAZINE) 10 MG tablet Take 1 tablet (10 mg total) by mouth every 6 (six) hours as needed (Nausea or vomiting). 02/28/18  Yes Volanda Napoleon, MD  tamsulosin (FLOMAX) 0.4 MG CAPS capsule Take 1 capsule (0.4 mg total) by mouth daily. 09/18/15  Yes Hedges, Dellis Filbert, PA-C  metoCLOPramide (REGLAN) 10 MG tablet Take 2 tablets (20 mg total) by mouth 4 (four) times daily -  before meals and at bedtime. 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 Increasing LFTs. Evaluate for biliary dilatation and/or liver abscess. EXAM: CT ABDOMEN AND PELVIS WITH CONTRAST TECHNIQUE: Multidetector CT imaging of the abdomen and pelvis was performed using the standard protocol following bolus administration of intravenous contrast. CONTRAST:  100 mL Isovue 300 IV COMPARISON:  MRI abdomen dated 03/14/2018 FINDINGS: Lower chest: Trace bilateral pleural effusions. Bilateral lower lobe atelectasis, right greater than left. Hepatobiliary: Liver is  grossly unremarkable. No suspicious/enhancing hepatic lesions. No evidence of liver abscess. Stable nodular soft tissue along the inferior wall of the gallbladder fundus (series 2/image 38), non FDG avid on PET. No intrahepatic or extrahepatic ductal dilatation status post external biliary drain placement, terminating in the distal 2nd/proximal 3rd portion of the duodenum. Pancreas: No pancreatic mass, atrophy, or ductal dilatation. Mild stranding  along the anterior aspect of the pancreatic body/tail, unchanged. Spleen: Within normal limits. Adrenals/Urinary Tract: Adrenal glands are within normal limits. Multiple bilateral nonobstructing renal calculi, measuring up to 5 mm in the anterior right upper kidney (series 2/image 49). No hydronephrosis. Bladder is within normal limits. Stomach/Bowel: Infiltrating masslike thickening of the distal stomach (series 2/image 38). Surrounding mesenteric stranding inferiorly (series 2/image 41). No evidence of bowel obstruction. Appendix is not discretely visualized. Left colonic diverticulosis, without evidence of diverticulitis. Vascular/Lymphatic: No evidence of abdominal aortic aneurysm. Atherosclerotic calcifications of the abdominal aorta and branch vessels. No suspicious abdominopelvic lymphadenopathy. Reproductive: Prostate is unremarkable. Other: Moderate abdominopelvic ascites.  No free air. Mild fat in the right inguinal canal. Musculoskeletal: Status post PLIF at L1-5. IMPRESSION: No residual intrahepatic/extrahepatic ductal dilatation status post external biliary drain placement. No evidence of liver lesion/abscess. Infiltrating masslike thickening of the distal stomach, unchanged. Additional stable ancillary findings as above. Electronically Signed   By: Julian Hy M.D.   On: 03/20/2018 22:24   Dg Chest Port 1 View  Result Date: 03/19/2018 CLINICAL DATA:  63 year old male with shortness of breath. Gastric cancer. Recently placed internal external biliary drain. EXAM: PORTABLE CHEST 1 VIEW COMPARISON:  Chest radiographs 09/16/2016. FINDINGS: Portable AP semi upright view at 1627 hours. Right chest IJ approach power port in place and accessed. Normal cardiac size and mediastinal contours. Visualized tracheal air column is within normal limits. No pneumothorax, pulmonary edema or pleural effusion. Mild lung base atelectasis greater on the left. Right upper quadrant catheter is partially visible.  Paucity of bowel gas in the upper abdomen. No acute osseous abnormality identified. IMPRESSION: Mild atelectasis, otherwise no acute cardiopulmonary abnormality. Electronically Signed   By: Genevie Ann M.D.   On: 03/19/2018 16:58   Ir Cholangiogram Existing Tube  Result Date: 03/20/2018 INDICATION: Biliary obstruction, status post internal/external biliary drain catheter placement 04/03/2018. Persistent elevation of bilirubin. Assess drain function. EXAM: CHOLANGIOGRAM THROUGH EXISTING CATHETER MEDICATIONS: NONE INDICATED ANESTHESIA/SEDATION: None required FLUOROSCOPY TIME:  0.8 minute; 937  uGym2 DAP COMPLICATIONS: None immediate. PROCEDURE: Informed written consent was obtained from the patient after a thorough discussion of the procedural risks, benefits and alternatives. All questions were addressed. A timeout was performed prior to the initiation of the procedure. Scout radiographic image obtained. This demonstrated stable position of the internal/external biliary drain catheter compared to placement images from 03/17/2018. Contrast was injected through the catheter. This opacified left biliary tree and portion of the right biliary tree. The inferior right intrahepatic biliary tree was not opacified, despite changes in patient positioning. Catheter is patent into the duodenum. The duodenum is decompressed and unremarkable. The catheter was flushed and returned to external drainage. IMPRESSION: 1. Internal/external biliary drain catheter remains in stable position, patent, with decompression of visualized intrahepatic biliary ducts. 2. Nonvisualization of portions of the right biliary tree suggesting possibility of central intrahepatic segmental biliary obstruction. MRCP may be useful for further evaluation. Findings were reviewed with Dr. Ardis Hughs. Electronically Signed   By: Lucrezia Europe M.D.   On: 03/20/2018 15:33    Labs:  CBC: Recent Labs    03/18/18 0513 03/19/18  0544 03/20/18 0400 03/21/18 0409    WBC 5.5 6.6 10.2 10.6*  HGB 9.2* 9.3* 8.5* 8.3*  HCT 28.8* 29.3* 26.0* 26.0*  PLT 411* 390 314 271    COAGS: Recent Labs    03/04/18 1220 03/16/18 0807  INR 1.00 1.31    BMP: Recent Labs    03/18/18 0513 03/19/18 0544 03/20/18 0400 03/21/18 0409  NA 136 136 135 135  K 3.3* 3.7 3.8 3.8  CL 102 101 100 100  CO2 23 25 24 23   GLUCOSE 106* 124* 132* 134*  BUN 7* 8 10 16   CALCIUM 8.3* 8.3* 8.1* 8.0*  CREATININE 0.55* 0.60* 0.64 0.82  GFRNONAA >60 >60 >60 >60  GFRAA >60 >60 >60 >60    LIVER FUNCTION TESTS: Recent Labs    03/18/18 0513 03/19/18 0544 03/20/18 0400 03/21/18 0409  BILITOT 10.0* 10.5* 12.5* 12.6*  AST 103* 81* 87* 66*  ALT 146* 126* 121* 98*  ALKPHOS 290* 286* 269* 218*  PROT 6.0* 5.8* 5.5* 5.5*  ALBUMIN 2.5* 2.5* 2.2* 2.1*    Assessment and Plan: Pt with hx gastric cancer (adenoca)/biliary obstruction; s/p I/E biliary drain 1/13; afebrile; WBC 10.6, hgb 8.3- receiving transfusion; creat nl; t bili 12.6(12.5), other LFT's down slightly; blood cx- e coli; bile cx/cyt pend; f/u cholangiogram yesterday- Internal/external biliary drain catheter remains in stable position, patent, with decompression of visualized intrahepatic biliary ducts. Nonvisualization of portions of the right biliary tree suggesting possibility of central intrahepatic segmental biliary obstruction. MRCP may be useful for further evaluation; CT A/P yesterday-No residual intrahepatic/extrahepatic ductal dilatation status post external biliary drain placement. No evidence of liver lesion/abscess. Infiltrating masslike thickening of the distal stomach, unchanged  Cont current tx - no additional biliary intervention planned at this time ; pt states they are planning for further eval at [facility]   Electronically Signed: D. Rowe Robert, PA-C 03/21/2018, 3:28 PM   I spent a total of 15 minutes at the the patient's bedside AND on the patient's hospital floor or unit, greater than 50% of  which was counseling/coordinating care for biliary drain    Patient ID: Fawn Kirk, male   DOB: 07/27/1955, 63 y.o.   MRN: [id number]

## 2018-03-22 DIAGNOSIS — E43 Unspecified severe protein-calorie malnutrition: Secondary | ICD-10-CM

## 2018-03-22 DIAGNOSIS — B9620 Unspecified Escherichia coli [E. coli] as the cause of diseases classified elsewhere: Secondary | ICD-10-CM

## 2018-03-22 DIAGNOSIS — E8809 Other disorders of plasma-protein metabolism, not elsewhere classified: Secondary | ICD-10-CM | POA: Diagnosis present

## 2018-03-22 DIAGNOSIS — D649 Anemia, unspecified: Secondary | ICD-10-CM | POA: Diagnosis present

## 2018-03-22 DIAGNOSIS — R7881 Bacteremia: Secondary | ICD-10-CM

## 2018-03-22 DIAGNOSIS — R7989 Other specified abnormal findings of blood chemistry: Secondary | ICD-10-CM | POA: Diagnosis present

## 2018-03-22 DIAGNOSIS — R945 Abnormal results of liver function studies: Secondary | ICD-10-CM | POA: Diagnosis present

## 2018-03-22 LAB — CULTURE, BLOOD (ROUTINE X 2)
Special Requests: ADEQUATE
Special Requests: ADEQUATE

## 2018-03-22 LAB — CBC WITH DIFFERENTIAL/PLATELET
Abs Immature Granulocytes: 0.06 10*3/uL (ref 0.00–0.07)
BASOS ABS: 0 10*3/uL (ref 0.0–0.1)
Basophils Relative: 0 %
Eosinophils Absolute: 0.1 10*3/uL (ref 0.0–0.5)
Eosinophils Relative: 1 %
HCT: 31.3 % — ABNORMAL LOW (ref 39.0–52.0)
Hemoglobin: 10.2 g/dL — ABNORMAL LOW (ref 13.0–17.0)
Immature Granulocytes: 1 %
Lymphocytes Relative: 5 %
Lymphs Abs: 0.3 10*3/uL — ABNORMAL LOW (ref 0.7–4.0)
MCH: 27.8 pg (ref 26.0–34.0)
MCHC: 32.6 g/dL (ref 30.0–36.0)
MCV: 85.3 fL (ref 80.0–100.0)
Monocytes Absolute: 0.5 10*3/uL (ref 0.1–1.0)
Monocytes Relative: 8 %
NEUTROS ABS: 5.3 10*3/uL (ref 1.7–7.7)
Neutrophils Relative %: 85 %
Platelets: 250 10*3/uL (ref 150–400)
RBC: 3.67 MIL/uL — ABNORMAL LOW (ref 4.22–5.81)
RDW: 16.7 % — ABNORMAL HIGH (ref 11.5–15.5)
WBC: 6.2 10*3/uL (ref 4.0–10.5)
nRBC: 0 % (ref 0.0–0.2)

## 2018-03-22 LAB — COMPREHENSIVE METABOLIC PANEL
ALT: 77 U/L — ABNORMAL HIGH (ref 0–44)
AST: 45 U/L — AB (ref 15–41)
Albumin: 2.2 g/dL — ABNORMAL LOW (ref 3.5–5.0)
Alkaline Phosphatase: 218 U/L — ABNORMAL HIGH (ref 38–126)
Anion gap: 11 (ref 5–15)
BUN: 15 mg/dL (ref 8–23)
CO2: 24 mmol/L (ref 22–32)
Calcium: 8.3 mg/dL — ABNORMAL LOW (ref 8.9–10.3)
Chloride: 99 mmol/L (ref 98–111)
Creatinine, Ser: 0.66 mg/dL (ref 0.61–1.24)
GFR calc Af Amer: >60 mL/min (ref >60)
GFR calc non Af Amer: >60 mL/min (ref >60)
GLUCOSE: 127 mg/dL — AB (ref 70–99)
Potassium: 3.1 mmol/L — ABNORMAL LOW (ref 3.5–5.1)
Sodium: 134 mmol/L — ABNORMAL LOW (ref 135–145)
Total Bilirubin: 12.7 mg/dL — ABNORMAL HIGH (ref 0.3–1.2)
Total Protein: 6 g/dL — ABNORMAL LOW (ref 6.5–8.1)

## 2018-03-22 MED ORDER — POLYVINYL ALCOHOL 1.4 % OP SOLN
1.0000 [drp] | Freq: Four times a day (QID) | OPHTHALMIC | Status: DC | PRN
  Administered 2018-03-22: 1 [drp] via OPHTHALMIC
  Filled 2018-03-22: qty 15

## 2018-03-22 MED ORDER — POTASSIUM CHLORIDE CRYS ER 20 MEQ PO TBCR
30.0000 meq | EXTENDED_RELEASE_TABLET | Freq: Two times a day (BID) | ORAL | Status: AC
  Administered 2018-03-22 (×2): 30 meq via ORAL
  Filled 2018-03-22 (×2): qty 1

## 2018-03-22 NOTE — Progress Notes (Signed)
7 Days Post-Op   Subjective/Chief Complaint: Seems to be slowly improving. Tolerated a fair amount of PO yesterday.    Objective: Vital signs in last 24 hours: Temp:  [97.8 F (36.6 C)-99.6 F (37.6 C)] 99.6 F (37.6 C) (01/18 0500) Pulse Rate:  [65-78] 78 (01/18 0500) Resp:  [16-18] 16 (01/18 0500) BP: (102-119)/(64-71) 119/67 (01/18 0500) SpO2:  [95 %-97 %] 97 % (01/18 0500) Last BM Date: 03/20/18  Intake/Output from previous day: 01/17 0701 - 01/18 0700 In: 4000.4 [P.O.:960; I.V.:1956; Blood:745; IV Piggyback:334.3] Out: 330 [Drains:330] Intake/Output this shift: No intake/output data recorded.  General appearance: alert and cooperative GI: soft, diffusely mildly tender  Lab Results:  Recent Labs    03/21/18 0409 03/22/18 0318  WBC 10.6* 6.2  HGB 8.3* 10.2*  HCT 26.0* 31.3*  PLT 271 250   BMET Recent Labs    03/21/18 0409 03/22/18 0318  NA 135 134*  K 3.8 3.1*  CL 100 99  CO2 23 24  GLUCOSE 134* 127*  BUN 16 15  CREATININE 0.82 0.66  CALCIUM 8.0* 8.3*   PT/INR No results for input(s): LABPROT, INR in the last 72 hours. ABG No results for input(s): PHART, HCO3 in the last 72 hours.  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 Increasing LFTs. Evaluate for biliary dilatation and/or liver abscess. EXAM: CT ABDOMEN AND PELVIS WITH CONTRAST TECHNIQUE: Multidetector CT imaging of the abdomen and pelvis was performed using the standard protocol following bolus administration of intravenous contrast. CONTRAST:  100 mL Isovue 300 IV COMPARISON:  MRI abdomen dated 03/14/2018 FINDINGS: Lower chest: Trace bilateral pleural effusions. Bilateral lower lobe atelectasis, right greater than left. Hepatobiliary: Liver is grossly unremarkable. No suspicious/enhancing hepatic lesions. No evidence of liver abscess. Stable  nodular soft tissue along the inferior wall of the gallbladder fundus (series 2/image 38), non FDG avid on PET. No intrahepatic or extrahepatic ductal dilatation status post external biliary drain placement, terminating in the distal 2nd/proximal 3rd portion of the duodenum. Pancreas: No pancreatic mass, atrophy, or ductal dilatation. Mild stranding along the anterior aspect of the pancreatic body/tail, unchanged. Spleen: Within normal limits. Adrenals/Urinary Tract: Adrenal glands are within normal limits. Multiple bilateral nonobstructing renal calculi, measuring up to 5 mm in the anterior right upper kidney (series 2/image 49). No hydronephrosis. Bladder is within normal limits. Stomach/Bowel: Infiltrating masslike thickening of the distal stomach (series 2/image 38). Surrounding mesenteric stranding inferiorly (series 2/image 41). No evidence of bowel obstruction. Appendix is not discretely visualized. Left colonic diverticulosis, without evidence of diverticulitis. Vascular/Lymphatic: No evidence of abdominal aortic aneurysm. Atherosclerotic calcifications of the abdominal aorta and branch vessels. No suspicious abdominopelvic lymphadenopathy. Reproductive: Prostate is unremarkable. Other: Moderate abdominopelvic ascites.  No free air. Mild fat in the right inguinal canal. Musculoskeletal: Status post PLIF at L1-5. IMPRESSION: No residual intrahepatic/extrahepatic ductal dilatation status post external biliary drain placement. No evidence of liver lesion/abscess. Infiltrating masslike thickening of the distal stomach, unchanged. Additional stable ancillary findings as above. Electronically Signed   By: Julian Hy M.D.   On: 03/20/2018 22:24   Ir Cholangiogram Existing Tube  Result Date: 03/20/2018 INDICATION: Biliary obstruction, status post internal/external biliary drain catheter placement 04/03/2018. Persistent elevation of bilirubin. Assess drain function. EXAM: CHOLANGIOGRAM THROUGH EXISTING  CATHETER MEDICATIONS: NONE INDICATED ANESTHESIA/SEDATION: None required FLUOROSCOPY TIME:  0.8 minute; 854  uGym2 DAP COMPLICATIONS: None immediate. PROCEDURE: Informed written consent  was obtained from the patient after a thorough discussion of the procedural risks, benefits and alternatives. All questions were addressed. A timeout was performed prior to the initiation of the procedure. Scout radiographic image obtained. This demonstrated stable position of the internal/external biliary drain catheter compared to placement images from 03/17/2018. Contrast was injected through the catheter. This opacified left biliary tree and portion of the right biliary tree. The inferior right intrahepatic biliary tree was not opacified, despite changes in patient positioning. Catheter is patent into the duodenum. The duodenum is decompressed and unremarkable. The catheter was flushed and returned to external drainage. IMPRESSION: 1. Internal/external biliary drain catheter remains in stable position, patent, with decompression of visualized intrahepatic biliary ducts. 2. Nonvisualization of portions of the right biliary tree suggesting possibility of central intrahepatic segmental biliary obstruction. MRCP may be useful for further evaluation. Findings were reviewed with Dr. Ardis Hughs. Electronically Signed   By: Lucrezia Europe M.D.   On: 03/20/2018 15:33    Anti-infectives: Anti-infectives (From admission, onward)   Start     Dose/Rate Route Frequency Ordered Stop   03/21/18 1300  gentamicin (GARAMYCIN) 560 mg in dextrose 5 % 100 mL IVPB  Status:  Discontinued     560 mg 114 mL/hr over 60 Minutes Intravenous Every 24 hours 03/21/18 1145 03/21/18 1635   03/19/18 1800  piperacillin-tazobactam (ZOSYN) IVPB 3.375 g  Status:  Discontinued     3.375 g 100 mL/hr over 30 Minutes Intravenous Every 6 hours 03/19/18 1450 03/19/18 1451   03/19/18 1600  piperacillin-tazobactam (ZOSYN) IVPB 3.375 g     3.375 g 12.5 mL/hr over 240  Minutes Intravenous Every 8 hours 03/19/18 1451     03/17/18 1146  piperacillin-tazobactam (ZOSYN) IVPB     over 30 Minutes  Continuous PRN 03/17/18 1147 03/17/18 1146   03/17/18 1144  piperacillin-tazobactam (ZOSYN) 3.375 GM/50ML IVPB    Note to Pharmacy:  Roe Coombs   : cabinet override      03/17/18 1144 03/17/18 2359   03/15/18 1430  ampicillin-sulbactam (UNASYN) 1.5 g in sodium chloride 0.9 % 100 mL IVPB  Status:  Discontinued    Note to Pharmacy:  Please tube to OR ASAP   1.5 g 200 mL/hr over 30 Minutes Intravenous On call 03/15/18 1417 03/15/18 1607   03/15/18 0800  ampicillin-sulbactam (UNASYN) 1.5 g in sodium chloride 0.9 % 100 mL IVPB     1.5 g 200 mL/hr over 30 Minutes Intravenous On call 03/14/18 1917 03/15/18 0804   03/14/18 1930  ampicillin-sulbactam (UNASYN) 1.5 g in sodium chloride 0.9 % 100 mL IVPB  Status:  Discontinued     1.5 g 200 mL/hr over 30 Minutes Intravenous  Once 03/14/18 1833 03/14/18 1917      Assessment/Plan: s/p Procedure(s): ESOPHAGOGASTRODUODENOSCOPY (EGD) WITH PROPOFOL (N/A) Aborted ERCP  Surgery will continue to follow peripherally   LOS: 9 days    Clovis Riley 03/22/2018

## 2018-03-22 NOTE — Progress Notes (Signed)
This morning, Mr. Edmondson looks better.  He says he feels better.  I believe that the antibiotics for the E. coli bacteremia are starting to work.  He got 2 units of blood last night.  Again I think this also will help.  His appetite is better.  Hopefully, he will be able to eat more.  I do not think that he needs to be on a cardiac monitor now.  He is in a normal sinus rhythm.  His vital signs are all stable.  I will go ahead and discontinue the cardiac monitor.  He is not hurting all that much.  He might be a little bit constipated.  He has had no fever.  He has had no nausea or vomiting.  His vital signs labs show a sodium 134.  Potassium 3.1.  His creatinine is 0.66.  His bilirubin is 12.7.  The SG PT is 77 SGOT 45.  His albumin is 2.2.  His CBC shows a white cell count of 6.2.  Hemoglobin 10.2.  Platelet count 250,000.  On his physical exam, his vital signs show temperature of 99.6.  Pulse 78.  Blood pressure 119/67.  Head neck exam shows no ocular or oral lesions.  He does have scleral icterus.  He has some palatal icterus.  Lungs are clear.  Cardiac exam regular rate and rhythm with no murmurs, rubs or bruits.  Abdomen is soft.  Bowel sounds are present.  There is no guarding or rebound tenderness.  He has no obvious abdominal mass.  Extremities shows no clubbing, cyanosis or edema.  Mr. Brancato hopefully has "turned the corner."  I know that it is a little early to definitively say this.  However, he just looks better to me.  The fact that he is eating more is also encouraging.  His bilirubin is essentially stable.  I think some of the elevated bilirubin is from this E. coli bacteremia.  He is on Zosyn for this.  I do not have the sensitivities back for the E. coli but I would think that Zosyn should be adequate.  I talked to he and his family at length.  I told him that we still have a ways to go before we know what is going to happen.  Hopefully, his bilirubin will start to  improve.  I think the blood transfusion clearly helped him.  Maybe, he will be able to get out of the bed and sit in a chair or even ambulate a little bit more.  I know that everybody has put in a very heroic effort to get him better.  I appreciate everybody's help so much.  All the staff on [address] have done a great job.  Lattie Haw, MD  Darlyn Chamber 30:17

## 2018-03-22 NOTE — Progress Notes (Addendum)
PROGRESS NOTE    Steven Ortega  WPY:[id number] DOB: Mar 19, 1955 DOA: 03/13/2018 PCP: Christain Sacramento, MD   Brief Narrative:  63 year old with past medical history relevant for recent diagnosis of gastric cancer complicated by gastric outlet obstruction, hypothyroidism, BPH, pancreatic insufficiency, depression/anxiety, severe malnutrition, who presented with extrinsic compression of biliary duct due to progression of gastric cancer status post failed ERCP and IR guided biliary placement on 03/17/2018. He's also found to have E coli bacteremia, on empiric zosyn, pending culture sensitivity. He also has acute on chronic anemia, s/p 2 Units PRBC. Family requested to transfer to [facility] but the hospitalist group never called back.    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 coli bacteremia from a biliary source:  - Continue empiric IV Zosyn started 03/19/2018, pending susceptibilities -Follow-up biliary bile culture  #) Hypokalemia - replete with kcl 30 mEq X2 - CMP in am  #) Acute on chronic anemia, requiring transfusion, no overt bleeding - s/p 2 units PRBC on 1/17 - Hb 10.2 from 8.3, which also makes pt feel better - continue to monitor Hb  #) Elevated LFTs/hyperbilirubinemia due to extrinsic compression of CBD by gastric cancer: Patient had cholangiogram through biliary stent which showed that it was in good  position.  Repeat CT of the abdomen pelvis showed that his biliary system had been decompressed.  t-bili stable.  -Interventional radiology following, appreciate recommendations, biliary drain placed on 03/17/2018 -General surgery consulted, no urgent  surgical plan -Oncology following, appreciate recommendations -Family is requesting transfer to [facility] which oncology agrees with; transferring time deferred to oncology  #) Gastric cancer complicated by gastric outlet obstruction:  - GI soft diet initiated on 1/17; pt seems to improve appetite and can tolerate more diet - continue stool softener and PRN laxatives for mild constipation  #) Pancreatic insufficiency/slow motility: -Continue Creon with meals  #) Pain/psych: -Continue alprazolam 1 mg nightly -Continue bupropion 150 mg twice daily -Continue citalopram 40 mg daily -Continue PRN cyclobenzaprine  #) BPH: -Continue tamsulosin 0.4 mg daily  #) Hypothyroidism: -Continue levothyroxine 25 mcg every morning  #) Generalized weakness - OOB to chair tid with meals - order PT/OT eval  Fluids: Tolerating p.o. Electrolytes: Monitor and supplement Nutrition: Full liquid diet  Prophylaxis: SCDs  Disposition: Pending resolution of biliary obstruction  Full code  Consultants:   Gastroenterology  Oncology, Dr. Marin Olp  Interventional radiology  General surgery  Procedures:   03/15/2018 ERCP: Aborted due to significant stomach contents  03/17/2018 IR guided biliary drain placement  Antimicrobials:   None   Subjective: Patient feels better. S/p 2 units PRBC and Hb from 8.3 to 10.2. still pending blood culture sensitivity. Afebrile. Wbc normal. Better appetite and had more oral intake since yesterday. Pain is manageable per patient. BM last night. No nausea or vomiting. C/o dry eye. Dr. Brigid Re Purohit called me and let me know that he reached out to [facility] yesterday but never head back from their hospitalist group regarding transferring.   Objective: Vitals:   03/21/18 1717 03/21/18 1811 03/21/18 2025 03/22/18 0500  BP: 102/71 109/70 112/64 119/67  Pulse: 68 71 65 78  Resp:  18 16  Temp: 98.1 F (36.7 C) 97.8 F (36.6 C) 98.7 F (37.1 C) 99.6 F (37.6 C)    TempSrc: Oral Oral Oral Oral  SpO2: 96% 95% 96% 97%  Weight:      Height:        Intake/Output Summary (Last 24 hours) at 03/22/2018 0907 Last data filed at 03/22/2018 0400 Gross per 24 hour  Intake 3880.35 ml  Output 330 ml  Net 3550.35 ml   Filed Weights   03/17/18 0550 03/18/18 0405 03/21/18 0500  Weight: 95 kg 94.6 kg 89.4 kg    Examination:  General exam: Appears calm mildly uncomfortable Respiratory system: Clear to auscultation. Respiratory effort normal. Cardiovascular system: Regular rate and rhythm, no murmurs Gastrointestinal system: Soft, moderately tender in right upper quadrant, no rebound or guarding, plus bowel sounds. Central nervous system: Alert and oriented.  Grossly intact, moving all extremities Extremities: Trace lower extremity edema. Skin: Grossly icteric, percutaneous biliary drain site is clean dry and intact, port site is clean dry and intact Psychiatry: Judgement and insight appear normal. Mood & affect appropriate.     Data Reviewed: I have personally reviewed following labs and imaging studies  CBC: Recent Labs  Lab 03/18/18 0513 03/19/18 0544 03/20/18 0400 03/21/18 0409 03/22/18 0318  WBC 5.5 6.6 10.2 10.6* 6.2  NEUTROABS  --   --   --  9.2* 5.3  HGB 9.2* 9.3* 8.5* 8.3* 10.2*  HCT 28.8* 29.3* 26.0* 26.0* 31.3*  MCV 86.2 84.4 84.7 86.1 85.3  PLT 411* 390 314 271 001   Basic Metabolic Panel: Recent Labs  Lab 03/18/18 0513 03/19/18 0544 03/20/18 0400 03/21/18 0409 03/22/18 0318  NA 136 136 135 135 134*  K 3.3* 3.7 3.8 3.8 3.1*  CL 102 101 100 100 99  CO2 23 25 24 23 24   GLUCOSE 106* 124* 132* 134* 127*  BUN 7* 8 10 16 15   CREATININE 0.55* 0.60* 0.64 0.82 0.66  CALCIUM 8.3* 8.3* 8.1* 8.0* 8.3*  MG  --   --  2.2  --   --    GFR: Estimated Creatinine Clearance: 102.4 mL/min (by C-G formula based on SCr of 0.66 mg/dL). Liver Function Tests: Recent Labs  Lab 03/18/18 0513 03/19/18 0544 03/20/18 0400 03/21/18 0409  03/22/18 0318  AST 103* 81* 87* 66* 45*  ALT 146* 126* 121* 98* 77*  ALKPHOS 290* 286* 269* 218* 218*  BILITOT 10.0* 10.5* 12.5* 12.6* 12.7*  PROT 6.0* 5.8* 5.5* 5.5* 6.0*  ALBUMIN 2.5* 2.5* 2.2* 2.1* 2.2*   No results for input(s): LIPASE, AMYLASE in the last 168 hours. No results for input(s): AMMONIA in the last 168 hours. Coagulation Profile: Recent Labs  Lab 03/16/18 0807  INR 1.31   Cardiac Enzymes: Recent Labs  Lab 03/19/18 1525 03/19/18 2226 03/20/18 0400  TROPONINI <0.03 <0.03 <0.03   BNP (last 3 results) No results for input(s): PROBNP in the last 8760 hours. HbA1C: No results for input(s): HGBA1C in the last 72 hours. CBG: No results for input(s): GLUCAP in the last 168 hours. Lipid Profile: No results for input(s): CHOL, HDL, LDLCALC, TRIG, CHOLHDL, LDLDIRECT in the last 72 hours. Thyroid Function Tests: No results for input(s): TSH, T4TOTAL, FREET4, T3FREE, THYROIDAB in the last 72 hours. Anemia Panel: No results for input(s): VITAMINB12, FOLATE, FERRITIN, TIBC, IRON, RETICCTPCT in the last 72 hours. Sepsis Labs: Recent Labs  Lab 03/19/18 1525 03/20/18 0400 03/21/18 0409  PROCALCITON 0.27 20.57 91.09    Recent Results (from the  past 240 hour(s))  Culture, blood (routine x 2)     Status: Abnormal   Collection Time: 03/19/18  3:24 PM  Result Value Ref Range Status   Specimen Description   Final    BLOOD RIGHT ARM Performed at [facility] [address] [place], [place] [address]    Special Requests   Final    BOTTLES DRAWN AEROBIC AND ANAEROBIC Blood Culture adequate volume Performed at [facility] [address] [place], [place] [address]    Culture  Setup Time   Final    GRAM NEGATIVE RODS IN BOTH AEROBIC AND ANAEROBIC BOTTLES CRITICAL VALUE NOTED.  VALUE IS CONSISTENT WITH PREVIOUSLY REPORTED AND CALLED VALUE.    Culture (A)  Final    ESCHERICHIA COLI SUSCEPTIBILITIES PERFORMED ON PREVIOUS  CULTURE WITHIN THE LAST 5 DAYS. Performed at [facility], [place] [address], [place], [place] [address]    Report Status 03/22/2018 FINAL  Final  Culture, blood (routine x 2)     Status: Abnormal   Collection Time: 03/19/18  3:33 PM  Result Value Ref Range Status   Specimen Description   Final    BLOOD RIGHT HAND Performed at [facility] [address] [place], [place] [address]    Special Requests   Final    BOTTLES DRAWN AEROBIC AND ANAEROBIC Blood Culture adequate volume Performed at [facility] [address] [place], [place] [address]    Culture  Setup Time   Final    GRAM NEGATIVE RODS IN BOTH AEROBIC AND ANAEROBIC BOTTLES CRITICAL RESULT CALLED TO, READ BACK BY AND VERIFIED WITH: Steven Ortega [id number] MLM Performed at [facility], [place] [address], [place], [place] [address]    Culture ESCHERICHIA COLI (A)  Final   Report Status 03/22/2018 FINAL  Final   Organism ID, Bacteria ESCHERICHIA COLI  Final      Susceptibility   Escherichia coli - MIC*    AMPICILLIN <=2 SENSITIVE Sensitive     CEFAZOLIN <=4 SENSITIVE Sensitive     CEFEPIME <=1 SENSITIVE Sensitive     CEFTAZIDIME <=1 SENSITIVE Sensitive     CEFTRIAXONE <=1 SENSITIVE Sensitive     CIPROFLOXACIN <=0.25 SENSITIVE Sensitive     GENTAMICIN <=1 SENSITIVE Sensitive     IMIPENEM <=0.25 SENSITIVE Sensitive     TRIMETH/SULFA <=20 SENSITIVE Sensitive     AMPICILLIN/SULBACTAM <=2 SENSITIVE Sensitive     PIP/TAZO <=4 SENSITIVE Sensitive     Extended ESBL NEGATIVE Sensitive     * ESCHERICHIA COLI  Blood Culture ID Panel (Reflexed)     Status: Abnormal   Collection Time: 03/19/18  3:33 PM  Result Value Ref Range Status   Enterococcus species NOT DETECTED NOT DETECTED Final   Listeria monocytogenes NOT DETECTED NOT DETECTED Final   Staphylococcus species NOT DETECTED NOT DETECTED Final   Staphylococcus aureus (BCID) NOT DETECTED NOT DETECTED Final   Streptococcus  species NOT DETECTED NOT DETECTED Final   Streptococcus agalactiae NOT DETECTED NOT DETECTED Final   Streptococcus pneumoniae NOT DETECTED NOT DETECTED Final   Streptococcus pyogenes NOT DETECTED NOT DETECTED Final   Acinetobacter baumannii NOT DETECTED NOT DETECTED Final   Enterobacteriaceae species DETECTED (A) NOT DETECTED Final    Comment: Enterobacteriaceae represent a large family of gram-negative bacteria, not a single organism. CRITICAL RESULT CALLED TO, READ BACK BY AND VERIFIED WITH: Dianna Rossetti GADHIA [id number] MLM    Enterobacter cloacae complex  NOT DETECTED NOT DETECTED Final   Escherichia coli DETECTED (A) NOT DETECTED Final    Comment: CRITICAL RESULT CALLED TO, READ BACK BY AND VERIFIED WITH: PHARMD J GADHIA [id number] MLM    Klebsiella oxytoca NOT DETECTED NOT DETECTED Final   Klebsiella pneumoniae NOT DETECTED NOT DETECTED Final   Proteus species NOT DETECTED NOT DETECTED Final   Serratia marcescens NOT DETECTED NOT DETECTED Final   Carbapenem resistance NOT DETECTED NOT DETECTED Final   Haemophilus influenzae NOT DETECTED NOT DETECTED Final   Neisseria meningitidis NOT DETECTED NOT DETECTED Final   Pseudomonas aeruginosa NOT DETECTED NOT DETECTED Final   Candida albicans NOT DETECTED NOT DETECTED Final   Candida glabrata NOT DETECTED NOT DETECTED Final   Candida krusei NOT DETECTED NOT DETECTED Final   Candida parapsilosis NOT DETECTED NOT DETECTED Final   Candida tropicalis NOT DETECTED NOT DETECTED Final    Comment: Performed at [facility], [place] [address], [place], [place] [address]  Body fluid culture     Status: None (Preliminary result)   Collection Time: 03/21/18  2:20 PM  Result Value Ref Range Status   Specimen Description BILE  Final   Special Requests   Final    NONE Performed at [facility], [place] [address] [place],  [address]    Gram Stain   Final    NO WBC SEEN FEW YEAST FEW GRAM NEGATIVE RODS RARE GRAM  POSITIVE RODS    Culture PENDING  Incomplete   Report Status PENDING  Incomplete         Radiology Studies: Ct Abdomen Pelvis W Contrast  Result Date: 03/20/2018 CLINICAL DATA:  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 Increasing LFTs. Evaluate for biliary dilatation and/or liver abscess. EXAM: CT ABDOMEN AND PELVIS WITH CONTRAST TECHNIQUE: Multidetector CT imaging of the abdomen and pelvis was performed using the standard protocol following bolus administration of intravenous contrast. CONTRAST:  100 mL Isovue 300 IV COMPARISON:  MRI abdomen dated 03/14/2018 FINDINGS: Lower chest: Trace bilateral pleural effusions. Bilateral lower lobe atelectasis, right greater than left. Hepatobiliary: Liver is grossly unremarkable. No suspicious/enhancing hepatic lesions. No evidence of liver abscess. Stable nodular soft tissue along the inferior wall of the gallbladder fundus (series 2/image 38), non FDG avid on PET. No intrahepatic or extrahepatic ductal dilatation status post external biliary drain placement, terminating in the distal 2nd/proximal 3rd portion of the duodenum. Pancreas: No pancreatic mass, atrophy, or ductal dilatation. Mild stranding along the anterior aspect of the pancreatic body/tail, unchanged. Spleen: Within normal limits. Adrenals/Urinary Tract: Adrenal glands are within normal limits. Multiple bilateral nonobstructing renal calculi, measuring up to 5 mm in the anterior right upper kidney (series 2/image 49). No hydronephrosis. Bladder is within normal limits. Stomach/Bowel: Infiltrating masslike thickening of the distal stomach (series 2/image 38). Surrounding mesenteric stranding inferiorly (series 2/image 41). No evidence of bowel obstruction. Appendix is not discretely visualized. Left colonic diverticulosis, without evidence of diverticulitis. Vascular/Lymphatic: No evidence of abdominal aortic aneurysm. Atherosclerotic calcifications of the  abdominal aorta and branch vessels. No suspicious abdominopelvic lymphadenopathy. Reproductive: Prostate is unremarkable. Other: Moderate abdominopelvic ascites.  No free air. Mild fat in the right inguinal canal. Musculoskeletal: Status post PLIF at L1-5. IMPRESSION: No residual intrahepatic/extrahepatic ductal dilatation status post external biliary drain placement. No evidence of liver lesion/abscess. Infiltrating masslike thickening of the distal stomach, unchanged. Additional stable ancillary findings as above. Electronically Signed   By: Julian Hy M.D.   On: 03/20/2018 22:24  Ir Cholangiogram Existing Tube  Result Date: 03/20/2018 INDICATION: Biliary obstruction, status post internal/external biliary drain catheter placement 04/03/2018. Persistent elevation of bilirubin. Assess drain function. EXAM: CHOLANGIOGRAM THROUGH EXISTING CATHETER MEDICATIONS: NONE INDICATED ANESTHESIA/SEDATION: None required FLUOROSCOPY TIME:  0.8 minute; 170  uGym2 DAP COMPLICATIONS: None immediate. PROCEDURE: Informed written consent was obtained from the patient after a thorough discussion of the procedural risks, benefits and alternatives. All questions were addressed. A timeout was performed prior to the initiation of the procedure. Scout radiographic image obtained. This demonstrated stable position of the internal/external biliary drain catheter compared to placement images from 03/17/2018. Contrast was injected through the catheter. This opacified left biliary tree and portion of the right biliary tree. The inferior right intrahepatic biliary tree was not opacified, despite changes in patient positioning. Catheter is patent into the duodenum. The duodenum is decompressed and unremarkable. The catheter was flushed and returned to external drainage. IMPRESSION: 1. Internal/external biliary drain catheter remains in stable position, patent, with decompression of visualized intrahepatic biliary ducts. 2.  Nonvisualization of portions of the right biliary tree suggesting possibility of central intrahepatic segmental biliary obstruction. MRCP may be useful for further evaluation. Findings were reviewed with Dr. Ardis Hughs. Electronically Signed   By: Lucrezia Europe M.D.   On: 03/20/2018 15:33        Scheduled Meds: . sodium chloride   Intravenous Once  . ALPRAZolam  1 mg Oral QHS  . alum & mag hydroxide-simeth  30 mL Oral Q8H  . buPROPion  150 mg Oral BID  . citalopram  40 mg Oral Daily  . cyclobenzaprine  10 mg Oral QHS  . feeding supplement (ENSURE ENLIVE)  237 mL Oral TID  . furosemide  20 mg Intravenous Once  . heparin injection (subcutaneous)  5,000 Units Subcutaneous Q8H  . lipase/protease/amylase  36,000 Units Oral TID AC  . pantoprazole  40 mg Oral BID  . polyethylene glycol  17 g Oral BID  . potassium chloride  30 mEq Oral BID  . senna-docusate  2 tablet Oral BID  . sodium chloride flush  10-40 mL Intracatheter Q12H  . sodium chloride flush  5 mL Intracatheter Q8H   Continuous Infusions: . sodium chloride Stopped (03/19/18 1751)  . ondansetron (ZOFRAN) IV    . piperacillin-tazobactam (ZOSYN)  IV 3.375 g (03/22/18 0803)     LOS: 9 days    Time spent: [place], MD [facility]  If 7PM-7AM, please contact night-coverage [email or web address] Password [email or web address] 03/22/2018, 9:07 AM

## 2018-03-23 LAB — COMPREHENSIVE METABOLIC PANEL
ALT: 69 U/L — ABNORMAL HIGH (ref 0–44)
AST: 46 U/L — ABNORMAL HIGH (ref 15–41)
Albumin: 2.2 g/dL — ABNORMAL LOW (ref 3.5–5.0)
Alkaline Phosphatase: 202 U/L — ABNORMAL HIGH (ref 38–126)
Anion gap: 8 (ref 5–15)
BUN: 13 mg/dL (ref 8–23)
CO2: 26 mmol/L (ref 22–32)
Calcium: 8.4 mg/dL — ABNORMAL LOW (ref 8.9–10.3)
Chloride: 104 mmol/L (ref 98–111)
Creatinine, Ser: 0.53 mg/dL — ABNORMAL LOW (ref 0.61–1.24)
GFR calc Af Amer: >60 mL/min (ref >60)
GFR calc non Af Amer: >60 mL/min (ref >60)
Glucose, Bld: 109 mg/dL — ABNORMAL HIGH (ref 70–99)
POTASSIUM: 3.5 mmol/L (ref 3.5–5.1)
Sodium: 138 mmol/L (ref 135–145)
Total Bilirubin: 12.6 mg/dL — ABNORMAL HIGH (ref 0.3–1.2)
Total Protein: 6 g/dL — ABNORMAL LOW (ref 6.5–8.1)

## 2018-03-23 LAB — CBC WITH DIFFERENTIAL/PLATELET
Abs Immature Granulocytes: 0.07 10*3/uL (ref 0.00–0.07)
Basophils Absolute: 0 10*3/uL (ref 0.0–0.1)
Basophils Relative: 0 %
Eosinophils Absolute: 0.1 10*3/uL (ref 0.0–0.5)
Eosinophils Relative: 2 %
HCT: 33.4 % — ABNORMAL LOW (ref 39.0–52.0)
Hemoglobin: 10.6 g/dL — ABNORMAL LOW (ref 13.0–17.0)
IMMATURE GRANULOCYTES: 1 %
Lymphocytes Relative: 10 %
Lymphs Abs: 0.5 10*3/uL — ABNORMAL LOW (ref 0.7–4.0)
MCH: 27.2 pg (ref 26.0–34.0)
MCHC: 31.7 g/dL (ref 30.0–36.0)
MCV: 85.9 fL (ref 80.0–100.0)
Monocytes Absolute: 0.7 10*3/uL (ref 0.1–1.0)
Monocytes Relative: 13 %
NEUTROS PCT: 74 %
Neutro Abs: 4 10*3/uL (ref 1.7–7.7)
PLATELETS: 242 10*3/uL (ref 150–400)
RBC: 3.89 MIL/uL — ABNORMAL LOW (ref 4.22–5.81)
RDW: 17.2 % — ABNORMAL HIGH (ref 11.5–15.5)
WBC: 5.5 10*3/uL (ref 4.0–10.5)
nRBC: 0 % (ref 0.0–0.2)

## 2018-03-23 MED ORDER — SODIUM CHLORIDE 0.9 % IV SOLN
2.0000 g | INTRAVENOUS | Status: DC
  Administered 2018-03-23 – 2018-03-24 (×2): 2 g via INTRAVENOUS
  Filled 2018-03-23 (×2): qty 2

## 2018-03-23 NOTE — Progress Notes (Signed)
PROGRESS NOTE    Steven Ortega  GGE:[id number] DOB: 1956-02-25 DOA: 03/13/2018 PCP: Christain Sacramento, MD   Brief Narrative:  63 year old with past medical history relevant for recent diagnosis of gastric cancer complicated by gastric outlet obstruction, hypothyroidism, BPH, pancreatic insufficiency, depression/anxiety, severe malnutrition, who presented with extrinsic compression of biliary duct due to progression of gastric cancer status post failed ERCP and IR guided biliary placement on 03/17/2018. He's also found to have E coli bacteremia, on empiric zosyn, pansensitive, switched to iv Rocephin for 10 more days (starting 1/19). He also has acute on chronic anemia, s/p 2 Units PRBC, Hb stable with no overt bleeding. Clinically pt is slowly improving with better appetite and stable t-bili. Family requested to transfer to [facility] but the hospitalist group never called back.    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 coli bacteremia from a biliary source:  - pan-sensitive to antibiotics, will switch to iv Rocephin for 10 more days (starting 1/19) -Follow-up biliary bile culture  #) Acute on chronic anemia, requiring transfusion, no overt bleeding - s/p 2 units PRBC on 1/17 - Hb 10.2 from 8.3, now 10.6 - continue to monitor Hb  #) Elevated LFTs/hyperbilirubinemia due to extrinsic compression of CBD by gastric cancer: Patient had cholangiogram through biliary stent which showed that it was in good  position.  Repeat CT of the abdomen pelvis showed that his biliary system had been decompressed.  t-bili stable.  -Interventional radiology following, appreciate recommendations,  biliary drain placed on 03/17/2018 -General surgery consulted, no urgent surgical plan -Oncology following, appreciate recommendations -Family is requesting transfer to [facility] which oncology agrees with; transferring time deferred to oncology/GI/surgery. Previous hospitalist reached out to [facility] hospitalist group but never heard back. I have not received update from the specialists involved here whether transfer is still the plan?  #) Gastric cancer complicated by gastric outlet obstruction:  - GI soft diet initiated on 1/17; pt seems to improve appetite and can tolerate more diet - continue stool softener and PRN laxatives for mild constipation  #) Pancreatic insufficiency/slow motility: -Continue Creon with meals  #) Pain/psych: -Continue alprazolam 1 mg nightly -Continue bupropion 150 mg twice daily -Continue citalopram 40 mg daily -Continue PRN cyclobenzaprine  #) BPH: -Continue tamsulosin 0.4 mg daily  #) Hypothyroidism: -Continue levothyroxine 25 mcg every morning  #) Generalized weakness - OOB to chair tid with meals - order PT/OT eval  Fluids: Tolerating p.o. Electrolytes: Monitor and supplement Nutrition: Full liquid diet  Prophylaxis: SCDs  Disposition: Pending resolution of biliary obstruction  Full code  Consultants:   Gastroenterology  Oncology, Dr. Marin Olp  Interventional radiology  General surgery  Procedures:   03/15/2018 ERCP: Aborted due to significant stomach contents  03/17/2018 IR guided biliary drain placement  Antimicrobials:   Iv Rocephin   Subjective: Improved appetite, but he did feel epigastric pain yesterday evening after eating. Hb 10.6.  Afebrile. Wbc normal.  Pain is manageable per patient. t-bili 12.6, stable.  Objective: Vitals:   03/22/18 1322 03/22/18 1723 03/22/18 2152 03/23/18 0649  BP: 133/83 125/82 119/78 123/79  Pulse: 69 69 70 67  Resp: 16  18 14   Temp: 98.7  F (37.1 C) 98.7 F (37.1 C) 98.9 F (37.2 C) 98.7 F  (37.1 C)  TempSrc: Oral Oral Oral Oral  SpO2: 94% 96% 94% 96%  Weight:    89.3 kg  Height:        Intake/Output Summary (Last 24 hours) at 03/23/2018 0757 Last data filed at 03/23/2018 8786 Gross per 24 hour  Intake 1216.9 ml  Output 550 ml  Net 666.9 ml   Filed Weights   03/18/18 0405 03/21/18 0500 03/23/18 0649  Weight: 94.6 kg 89.4 kg 89.3 kg    Examination:  General exam: Appears calm mildly uncomfortable Respiratory system: Clear to auscultation. Respiratory effort normal. Cardiovascular system: Regular rate and rhythm, no murmurs Gastrointestinal system: Soft, moderately tender in right upper quadrant, no rebound or guarding, plus bowel sounds. Central nervous system: Alert and oriented.  Grossly intact, moving all extremities Extremities: Trace lower extremity edema. Skin: Grossly icteric, percutaneous biliary drain site is clean dry and intact, port site is clean dry and intact Psychiatry: Judgement and insight appear normal. Mood & affect appropriate.     Data Reviewed: I have personally reviewed following labs and imaging studies  CBC: Recent Labs  Lab 03/19/18 0544 03/20/18 0400 03/21/18 0409 03/22/18 0318 03/23/18 0346  WBC 6.6 10.2 10.6* 6.2 5.5  NEUTROABS  --   --  9.2* 5.3 4.0  HGB 9.3* 8.5* 8.3* 10.2* 10.6*  HCT 29.3* 26.0* 26.0* 31.3* 33.4*  MCV 84.4 84.7 86.1 85.3 85.9  PLT 390 314 271 250 767   Basic Metabolic Panel: Recent Labs  Lab 03/19/18 0544 03/20/18 0400 03/21/18 0409 03/22/18 0318 03/23/18 0346  NA 136 135 135 134* 138  K 3.7 3.8 3.8 3.1* 3.5  CL 101 100 100 99 104  CO2 25 24 23 24 26   GLUCOSE 124* 132* 134* 127* 109*  BUN 8 10 16 15 13   CREATININE 0.60* 0.64 0.82 0.66 0.53*  CALCIUM 8.3* 8.1* 8.0* 8.3* 8.4*  MG  --  2.2  --   --   --    GFR: Estimated Creatinine Clearance: 102.4 mL/min (A) (by C-G formula based on SCr of 0.53 mg/dL (L)). Liver Function Tests: Recent Labs  Lab 03/19/18 0544 03/20/18 0400 03/21/18 0409  03/22/18 0318 03/23/18 0346  AST 81* 87* 66* 45* 46*  ALT 126* 121* 98* 77* 69*  ALKPHOS 286* 269* 218* 218* 202*  BILITOT 10.5* 12.5* 12.6* 12.7* 12.6*  PROT 5.8* 5.5* 5.5* 6.0* 6.0*  ALBUMIN 2.5* 2.2* 2.1* 2.2* 2.2*   No results for input(s): LIPASE, AMYLASE in the last 168 hours. No results for input(s): AMMONIA in the last 168 hours. Coagulation Profile: Recent Labs  Lab 03/16/18 0807  INR 1.31   Cardiac Enzymes: Recent Labs  Lab 03/19/18 1525 03/19/18 2226 03/20/18 0400  TROPONINI <0.03 <0.03 <0.03   BNP (last 3 results) No results for input(s): PROBNP in the last 8760 hours. HbA1C: No results for input(s): HGBA1C in the last 72 hours. CBG: No results for input(s): GLUCAP in the last 168 hours. Lipid Profile: No results for input(s): CHOL, HDL, LDLCALC, TRIG, CHOLHDL, LDLDIRECT in the last 72 hours. Thyroid Function Tests: No results for input(s): TSH, T4TOTAL, FREET4, T3FREE, THYROIDAB in the last 72 hours. Anemia Panel: No results for input(s): VITAMINB12, FOLATE, FERRITIN, TIBC, IRON, RETICCTPCT in the last 72 hours. Sepsis Labs: Recent Labs  Lab 03/19/18 1525 03/20/18 0400 03/21/18 0409  PROCALCITON 0.27 20.57 91.09    Recent Results (from the past 240 hour(s))  Culture,  blood (routine x 2)     Status: Abnormal   Collection Time: 03/19/18  3:24 PM  Result Value Ref Range Status   Specimen Description   Final    BLOOD RIGHT ARM Performed at [facility] [address] [place], [place] [address]    Special Requests   Final    BOTTLES DRAWN AEROBIC AND ANAEROBIC Blood Culture adequate volume Performed at [facility] [address] [place], [place] [address]    Culture  Setup Time   Final    GRAM NEGATIVE RODS IN BOTH AEROBIC AND ANAEROBIC BOTTLES CRITICAL VALUE NOTED.  VALUE IS CONSISTENT WITH PREVIOUSLY REPORTED AND CALLED VALUE.    Culture (A)  Final    ESCHERICHIA COLI SUSCEPTIBILITIES PERFORMED ON  PREVIOUS CULTURE WITHIN THE LAST 5 DAYS. Performed at [facility], [place] [address] [place], [place] [address]    Report Status 03/22/2018 FINAL  Final  Culture, blood (routine x 2)     Status: Abnormal   Collection Time: 03/19/18  3:33 PM  Result Value Ref Range Status   Specimen Description   Final    BLOOD RIGHT HAND Performed at [facility] [address] [place], [place] [address]    Special Requests   Final    BOTTLES DRAWN AEROBIC AND ANAEROBIC Blood Culture adequate volume Performed at [facility] [address], [place], [place] [address]    Culture  Setup Time   Final    GRAM NEGATIVE RODS IN BOTH AEROBIC AND ANAEROBIC BOTTLES CRITICAL RESULT CALLED TO, READ BACK BY AND VERIFIED WITH: Damian Leavell [id number] MLM Performed at [facility], [place] [address] [place], [place] [address]    Culture ESCHERICHIA COLI (A)  Final   Report Status 03/22/2018 FINAL  Final   Organism ID, Bacteria ESCHERICHIA COLI  Final      Susceptibility   Escherichia coli - MIC*    AMPICILLIN <=2 SENSITIVE Sensitive     CEFAZOLIN <=4 SENSITIVE Sensitive     CEFEPIME <=1 SENSITIVE Sensitive     CEFTAZIDIME <=1 SENSITIVE Sensitive     CEFTRIAXONE <=1 SENSITIVE Sensitive     CIPROFLOXACIN <=0.25 SENSITIVE Sensitive     GENTAMICIN <=1 SENSITIVE Sensitive     IMIPENEM <=0.25 SENSITIVE Sensitive     TRIMETH/SULFA <=20 SENSITIVE Sensitive     AMPICILLIN/SULBACTAM <=2 SENSITIVE Sensitive     PIP/TAZO <=4 SENSITIVE Sensitive     Extended ESBL NEGATIVE Sensitive     * ESCHERICHIA COLI  Blood Culture ID Panel (Reflexed)     Status: Abnormal   Collection Time: 03/19/18  3:33 PM  Result Value Ref Range Status   Enterococcus species NOT DETECTED NOT DETECTED Final   Listeria monocytogenes NOT DETECTED NOT DETECTED Final   Staphylococcus species NOT DETECTED NOT DETECTED Final   Staphylococcus aureus (BCID) NOT DETECTED NOT DETECTED Final    Streptococcus species NOT DETECTED NOT DETECTED Final   Streptococcus agalactiae NOT DETECTED NOT DETECTED Final   Streptococcus pneumoniae NOT DETECTED NOT DETECTED Final   Streptococcus pyogenes NOT DETECTED NOT DETECTED Final   Acinetobacter baumannii NOT DETECTED NOT DETECTED Final   Enterobacteriaceae species DETECTED (A) NOT DETECTED Final    Comment: Enterobacteriaceae represent a large family of gram-negative bacteria, not a single organism. CRITICAL RESULT CALLED TO, READ BACK BY AND VERIFIED WITH: PHARMD J GADHIA [id number] MLM    Enterobacter cloacae complex NOT DETECTED NOT DETECTED Final  Escherichia coli DETECTED (A) NOT DETECTED Final    Comment: CRITICAL RESULT CALLED TO, READ BACK BY AND VERIFIED WITH: PHARMD J GADHIA [phone] MLM    Klebsiella oxytoca NOT DETECTED NOT DETECTED Final   Klebsiella pneumoniae NOT DETECTED NOT DETECTED Final   Proteus species NOT DETECTED NOT DETECTED Final   Serratia marcescens NOT DETECTED NOT DETECTED Final   Carbapenem resistance NOT DETECTED NOT DETECTED Final   Haemophilus influenzae NOT DETECTED NOT DETECTED Final   Neisseria meningitidis NOT DETECTED NOT DETECTED Final   Pseudomonas aeruginosa NOT DETECTED NOT DETECTED Final   Candida albicans NOT DETECTED NOT DETECTED Final   Candida glabrata NOT DETECTED NOT DETECTED Final   Candida krusei NOT DETECTED NOT DETECTED Final   Candida parapsilosis NOT DETECTED NOT DETECTED Final   Candida tropicalis NOT DETECTED NOT DETECTED Final    Comment: Performed at [facility], [place] [address], [place], [place] [address]  Body fluid culture     Status: None (Preliminary result)   Collection Time: 03/21/18  2:20 PM  Result Value Ref Range Status   Specimen Description   Final    BILE Performed at [facility] [address], [place], [place] [address]    Special Requests   Final    NONE Performed at [facility], [place] [address],  [place], [place] [address]    Gram Stain   Final    NO WBC SEEN FEW YEAST FEW GRAM NEGATIVE RODS RARE GRAM POSITIVE RODS    Culture   Final    CULTURE REINCUBATED FOR BETTER GROWTH Performed at [facility], [place] [address], [place], [place] [address]    Report Status PENDING  Incomplete         Radiology Studies: No results found.      Scheduled Meds: . sodium chloride   Intravenous Once  . ALPRAZolam  1 mg Oral QHS  . alum & mag hydroxide-simeth  30 mL Oral Q8H  . buPROPion  150 mg Oral BID  . citalopram  40 mg Oral Daily  . cyclobenzaprine  10 mg Oral QHS  . feeding supplement (ENSURE ENLIVE)  237 mL Oral TID  . furosemide  20 mg Intravenous Once  . heparin injection (subcutaneous)  5,000 Units Subcutaneous Q8H  . lipase/protease/amylase  36,000 Units Oral TID AC  . pantoprazole  40 mg Oral BID  . polyethylene glycol  17 g Oral BID  . senna-docusate  2 tablet Oral BID  . sodium chloride flush  10-40 mL Intracatheter Q12H  . sodium chloride flush  5 mL Intracatheter Q8H   Continuous Infusions: . sodium chloride 50 mL/hr at 03/23/18 0639  . ondansetron (ZOFRAN) IV    . piperacillin-tazobactam (ZOSYN)  IV 3.375 g (03/23/18 0046)     LOS: 10 days    Time spent: [place], MD [facility]  If 7PM-7AM, please contact night-coverage [email or web address] Password [facility] 03/23/2018, 7:57 AM

## 2018-03-23 NOTE — Progress Notes (Signed)
PT Cancellation Note  Patient Details Name: Steven Ortega MRN: [id number] DOB: 04-23-1955   Cancelled Treatment:    Reason Eval/Treat Not Completed: Patient declined, no reason specified. Pt reports fatigue and nursing confirmed recents meds that would make him sleepy.  Pt has been up walking in the halls with family per nursing.  Will check back tomorrow.   Galen Manila 03/23/2018, 2:33 PM

## 2018-03-23 NOTE — Evaluation (Signed)
Occupational Therapy Evaluation Patient Details Name: Steven Ortega MRN: [id number] DOB: June 06, 1955 Today's Date: 03/23/2018    History of Present Illness 63 year old with past medical history relevant for recent diagnosis of gastric cancer complicated by gastric outlet obstruction, hypothyroidism, BPH, pancreatic insufficiency, depression/anxiety, severe malnutrition, who presented with extrinsic compression of biliary duct due to progression of gastric cancer status post failed ERCP and IR guided biliary placement on 03/17/2018. He's also found to have E coli bacteremia   Clinical Impression   PATIENT AND HIS WIFE WERE EDUCATED TO LET PATIENT ASSIST WITH ADLS TO INCREASE STRENGTH AND ENDURANCE. PATIENT WIFE HAS BEEN PROVIDING ASSIST. ENCOURAGED PATIENT AND WIFE TO SIT UP IN CHAIR TO INCREASE ENDURANCE. PATIENT FAMILY HAVE BEEN AMB IN THE HALL TO INCREASE ENDURANCE. PATIENT TO BE FOLLOWED BY ACUTE OT.    Follow Up Recommendations  (PATIENT FAMILY WANT HIM TRANSFERRED TO [facility])    Equipment Recommendations       Recommendations for Other Services       Precautions / Restrictions Precautions Precautions: Fall Restrictions Weight Bearing Restrictions: No      Mobility Bed Mobility Overal bed mobility: Modified Independent                Transfers Overall transfer level: Needs assistance   Transfers: Stand Pivot Transfers   Stand pivot transfers: Min guard       General transfer comment: PNT FAMILY ARE WALKING PATIENT IN HALLWAY    Balance                                           ADL either performed or assessed with clinical judgement   ADL Overall ADL's : Needs assistance/impaired Eating/Feeding: Independent Eating/Feeding Details (indicate cue type and reason): PATIENT REPORTS HE IS FEELING PAIN WHEN EATING IN HIS ABDOMEN. HE STATES MD IS AWARE Grooming: Wash/dry hands;Wash/dry face;Supervision/safety;Set up;Standing   Upper Body Bathing:  Minimal assistance;Sitting   Lower Body Bathing: Minimal assistance;Sit to/from stand(USING CROSS LEG TECHNIQUE)   Upper Body Dressing : Minimal assistance;Sitting   Lower Body Dressing: Minimal assistance;Sit to/from stand(USING CROSS LEG TECHNIQUE)   Toilet Transfer: Min guard;Ambulation   Toileting- Clothing Manipulation and Hygiene: Min guard;Sit to/from stand       Functional mobility during ADLs: Min guard General ADL Comments: PATIENT WIFE IS ASSISTING WITH ADLS CURRENTLY.     Vision Baseline Vision/History: Wears glasses Wears Glasses: At all times Patient Visual Report: No change from baseline       Perception     Praxis      Pertinent Vitals/Pain       Hand Dominance     Extremity/Trunk Assessment Upper Extremity Assessment Upper Extremity Assessment: Generalized weakness           Communication Communication Communication: No difficulties   Cognition Arousal/Alertness: Awake/alert Behavior During Therapy: WFL for tasks assessed/performed Overall Cognitive Status: Within Functional Limits for tasks assessed                                     General Comments       Exercises     Shoulder Instructions      Home Living Family/patient expects to be discharged to:: (THEY WANT PATIENT TRANSFERRED TO [facility].) Living Arrangements: Spouse/significant other  Prior Functioning/Environment Level of Independence: Independent                 OT Problem List: Decreased strength;Decreased activity tolerance;Impaired balance (sitting and/or standing);Decreased knowledge of use of DME or AE      OT Treatment/Interventions: Self-care/ADL training;DME and/or AE instruction;Patient/family education    OT Goals(Current goals can be found in the care plan section) Acute Rehab OT Goals Patient Stated Goal: PNT AND FAMILY WANT TO GO TO [facility]  OT Goal Formulation: With  patient Time For Goal Achievement: 04/06/18 Potential to Achieve Goals: Good ADL Goals Pt Will Perform Grooming: (P) Independently;standing Pt Will Perform Upper Body Bathing: (P) Independently;sitting Pt Will Perform Lower Body Bathing: (P) with mod assist;sit to/from stand Pt Will Perform Upper Body Dressing: (P) Independently;sitting Pt Will Perform Lower Body Dressing: (P) with modified independence;sit to/from stand Pt Will Transfer to Toilet: (P) with modified independence;ambulating Pt Will Perform Toileting - Clothing Manipulation and hygiene: (P) with modified independence;sit to/from stand  OT Frequency: Min 2X/week   Barriers to D/C:            Co-evaluation              AM-PAC OT "6 Clicks" Daily Activity     Outcome Measure Help from another person eating meals?: None Help from another person taking care of personal grooming?: A Little Help from another person toileting, which includes using toliet, bedpan, or urinal?: A Little Help from another person bathing (including washing, rinsing, drying)?: A Little Help from another person to put on and taking off regular upper body clothing?: A Little Help from another person to put on and taking off regular lower body clothing?: A Little 6 Click Score: 19   End of Session Nurse Communication: (OK THERAPY)  Activity Tolerance:   Patient left: in bed;with call bell/phone within reach;with family/visitor present  OT Visit Diagnosis: Unsteadiness on feet (R26.81);Muscle weakness (generalized) (M62.81)                Time: 1779-3903 OT Time Calculation (min): 30 min Charges:  OT General Charges $OT Visit: 1 Visit OT Evaluation $OT Eval Low Complexity: 1 Low OT Treatments $Self Care/Home Management : 0-09 mins  6 CLICKS  [place] Arabie 03/23/2018, 11:23 AM

## 2018-03-24 ENCOUNTER — Encounter: Payer: Self-pay | Admitting: *Deleted

## 2018-03-24 LAB — CBC WITH DIFFERENTIAL/PLATELET
Abs Immature Granulocytes: 0.3 10*3/uL — ABNORMAL HIGH (ref 0.00–0.07)
Basophils Absolute: 0 10*3/uL (ref 0.0–0.1)
Basophils Relative: 1 %
Eosinophils Absolute: 0.1 10*3/uL (ref 0.0–0.5)
Eosinophils Relative: 1 %
HCT: 32.7 % — ABNORMAL LOW (ref 39.0–52.0)
HEMOGLOBIN: 10.5 g/dL — AB (ref 13.0–17.0)
Immature Granulocytes: 5 %
Lymphocytes Relative: 10 %
Lymphs Abs: 0.6 10*3/uL — ABNORMAL LOW (ref 0.7–4.0)
MCH: 27.8 pg (ref 26.0–34.0)
MCHC: 32.1 g/dL (ref 30.0–36.0)
MCV: 86.5 fL (ref 80.0–100.0)
Monocytes Absolute: 0.9 10*3/uL (ref 0.1–1.0)
Monocytes Relative: 14 %
NEUTROS ABS: 4.3 10*3/uL (ref 1.7–7.7)
Neutrophils Relative %: 69 %
Platelets: 247 10*3/uL (ref 150–400)
RBC: 3.78 MIL/uL — ABNORMAL LOW (ref 4.22–5.81)
RDW: 17.6 % — ABNORMAL HIGH (ref 11.5–15.5)
WBC: 6.3 10*3/uL (ref 4.0–10.5)
nRBC: 0 % (ref 0.0–0.2)

## 2018-03-24 LAB — BODY FLUID CULTURE: Gram Stain: NONE SEEN

## 2018-03-24 LAB — COMPREHENSIVE METABOLIC PANEL
ALT: 66 U/L — AB (ref 0–44)
AST: 45 U/L — AB (ref 15–41)
Albumin: 2.1 g/dL — ABNORMAL LOW (ref 3.5–5.0)
Alkaline Phosphatase: 183 U/L — ABNORMAL HIGH (ref 38–126)
Anion gap: 8 (ref 5–15)
BUN: 12 mg/dL (ref 8–23)
CO2: 25 mmol/L (ref 22–32)
CREATININE: 0.52 mg/dL — AB (ref 0.61–1.24)
Calcium: 8.3 mg/dL — ABNORMAL LOW (ref 8.9–10.3)
Chloride: 105 mmol/L (ref 98–111)
GFR calc Af Amer: >60 mL/min (ref >60)
GFR calc non Af Amer: >60 mL/min (ref >60)
Glucose, Bld: 102 mg/dL — ABNORMAL HIGH (ref 70–99)
Potassium: 3.6 mmol/L (ref 3.5–5.1)
Sodium: 138 mmol/L (ref 135–145)
Total Bilirubin: 11.4 mg/dL — ABNORMAL HIGH (ref 0.3–1.2)
Total Protein: 5.7 g/dL — ABNORMAL LOW (ref 6.5–8.1)

## 2018-03-24 MED ORDER — METOCLOPRAMIDE HCL 5 MG/ML IJ SOLN
10.0000 mg | Freq: Four times a day (QID) | INTRAMUSCULAR | Status: DC | PRN
  Administered 2018-03-24 (×2): 10 mg via INTRAVENOUS
  Filled 2018-03-24 (×2): qty 2

## 2018-03-24 MED ORDER — MORPHINE SULFATE (PF) 2 MG/ML IV SOLN
2.0000 mg | INTRAVENOUS | Status: DC | PRN
  Administered 2018-03-24 (×3): 2 mg via INTRAVENOUS
  Filled 2018-03-24 (×3): qty 1

## 2018-03-24 MED ORDER — SODIUM CHLORIDE 0.9 % IV SOLN: 100.0000 mg | INTRAVENOUS | Status: DC

## 2018-03-24 MED ORDER — SODIUM CHLORIDE 0.9 % IV SOLN
200.0000 mg | Freq: Once | INTRAVENOUS | Status: AC
  Administered 2018-03-24: 200 mg via INTRAVENOUS
  Filled 2018-03-24: qty 200

## 2018-03-24 MED ORDER — SODIUM CHLORIDE 0.9 % IV SOLN
2.0000 g | Freq: Three times a day (TID) | INTRAVENOUS | Status: DC
  Administered 2018-03-24: 2 g via INTRAVENOUS
  Filled 2018-03-24 (×3): qty 2

## 2018-03-24 MED ORDER — BUPROPION HCL ER (SR) 100 MG PO TB12
100.0000 mg | ORAL_TABLET | Freq: Two times a day (BID) | ORAL | Status: DC
  Administered 2018-03-24: 100 mg via ORAL
  Filled 2018-03-24 (×2): qty 1

## 2018-03-24 NOTE — Discharge Summary (Signed)
Physician Discharge Summary  Steven Ortega DOB: 1955/03/21 DOA: 03/13/2018  PCP: Christain [place], MD  Admit date: 03/13/2018 Discharge date: 03/24/2018  Admitted From: Home Disposition:  [place]  Recommendations for Outpatient Follow-up:  1. Follow up with PCP in 1-2 weeks 2. Please obtain BMP/CBC in one week   Home Health: No Equipment/Devices:No  Discharge Condition: stable CODE STATUS: FULL Diet recommendation: FULL Liquid diet  Brief/Interim Summary:  #) Elevated LFTs/hyperbilirubinemia due to extrinsic compression of gastric bile duct by stage II gastric adenocarcinoma: Patient was admitted on 03/13/2018 with abdominal pain and elevated LFTs with primarily cholestatic pattern of liver injury.  Ultrasound on 03/13/2018 showed mild intra-and extrahepatic biliary ductal dilatation.  MR CP on 03/14/2018 showed mural thickening and infiltration of the stomach tumor into adjacent soft tissues invading the porta hepatis with mass-effect on proximal common bile duct as well as mild intrahepatic and extrahepatic biliary ductal dilatation.  ERCP was attempted on 03/15/2018 but could not be performed due to stomach contents from past partial gastric outlet obstruction.  Due to rising LFTs and bilirubin a IR guided biliary drain was placed on 03/17/2018.  Unfortunately patient's bilirubin continued to rise to a peak of around 12.6.  On 03/19/2018 patient developed chills and concern for cholangitis.  (Please see below for E. coli sepsis septicemia).  A cholangiogram was performed through the biliary drain on 03/20/2018 which showed drain was in stable position with decompression of intrahepatic biliary ducts.  There is nonvisualization of portions of the right biliary tree suggesting possible central intrahepatic segmental biliary obstruction.  A repeat CT scan on 03/20/2018 showed residual intrahepatic or extra [facility] ductal dilatation status post drain placement.  There is no evidence of  liver lesion or abscess.  Surgery was consulted and did not feel that there was any surgical treatment options.  On discussion with general surgery, GI, oncology it was recommended that tertiary care referral should be pursued.  [facility] was called and case was discussed with Dr. Maudie Mercury from surgical oncology who agreed to accept the patient.  #) Sepsis due to E. coli cholangitis: On 03/19/2018 patient began having rigors and was febrile.  Patient complained of chest pain.  Chest x-ray was clear.  Troponins were negative.  EKG was unremarkable.  Blood cultures were taken and patient was started on empiric IV cefepime.  Blood cultures grew out pansensitive E. coli.  Cultures were taken additionally from the biliary drain on 03/20/2018 which grew out Enterobacter cloacae which was resistant to ceftriaxone and Candida glabrata.  Patient was started on iconic Candin on 03/24/2018.  Patient's fever completely resolved on initiation of antibiotics by 03/20/2018.  #) Gastric cancer complicated by gastric outlet obstruction: Patient was recently diagnosed with stage II gastric adenocarcinoma.  Patient was initially on a full liquid diet due to concern for gastric outlet obstruction but a soft diet was initiated on 03/21/2018.  Patient tolerated this well.  #) Pancreatic insufficiency/slowed motility: Patient was continued with Creon with meals and metoclopramide for motility.  #) Acute on chronic anemia: On 03/21/2018 patient was given 2 units packed red blood cells due to slowly drifting anemia.  Patient had no evidence of acute blood loss.  #) Pain/psych: Patient was continued on alprazolam, bupropion, citalopram, PRN cyclobenzaprine.  #) BPH: Patient was continued on home tamsulosin.  #) Hypothyroidism: Patient was continued on home levothyroxine.  Discharge Diagnoses:  Principal Problem:   Hyperbilirubinemia Active Problems:   Cancer of lesser curvature of stomach (HCC)  Hypothyroidism   Biliary obstruction due  to malignant neoplasm (HCC)   Protein-calorie malnutrition, severe   Hypoalbuminemia   LFTs abnormal   E coli bacteremia   Anemia requiring transfusions, s/p 2 unit PRBC    Discharge Instructions  Discharge Instructions    Call MD for:  difficulty breathing, headache or visual disturbances   Complete by:  As directed    Call MD for:  hives   Complete by:  As directed    Call MD for:  persistant nausea and vomiting   Complete by:  As directed    Call MD for:  redness, tenderness, or signs of infection (pain, swelling, redness, odor or green/yellow discharge around incision site)   Complete by:  As directed    Call MD for:  severe uncontrolled pain   Complete by:  As directed    Call MD for:  temperature >100.4   Complete by:  As directed    Diet - low sodium heart healthy   Complete by:  As directed    Increase activity slowly   Complete by:  As directed      Allergies as of 03/24/2018   No Known Allergies     Medication List    TAKE these medications   ALPRAZolam 1 MG tablet Commonly known as:  XANAX Take 1 mg by mouth at bedtime.   buPROPion 150 MG 12 hr tablet Commonly known as:  ZYBAN Take 150 mg by mouth 2 (two) times daily.   citalopram 40 MG tablet Commonly known as:  CELEXA Take 40 mg by mouth daily.   cyclobenzaprine 10 MG tablet Commonly known as:  FLEXERIL Take 10 mg by mouth at bedtime.   HYDROmorphone 2 MG tablet Commonly known as:  DILAUDID Take 1 tablet (2 mg total) by mouth every 4 (four) hours as needed for severe pain.   levothyroxine 25 MCG tablet Commonly known as:  SYNTHROID, LEVOTHROID Take 25 mcg by mouth daily before breakfast.   lidocaine-prilocaine cream Commonly known as:  EMLA Apply to affected area once   lipase/protease/amylase 36000 UNITS Cpep capsule Commonly known as:  CREON Take 2 capsules (72,000 Units total) by mouth 3 (three) times daily before meals.   metoCLOPramide 10 MG tablet Commonly known as:  REGLAN Take  2 tablets (20 mg total) by mouth 4 (four) times daily -  before meals and at bedtime.   ondansetron 8 MG tablet Commonly known as:  ZOFRAN Take 1 tablet (8 mg total) by mouth 2 (two) times daily as needed for refractory nausea / vomiting. Start on day 3 after chemotherapy.   pantoprazole 40 MG tablet Commonly known as:  PROTONIX Take 1 tablet (40 mg total) by mouth 2 (two) times daily before a meal.   polyethylene glycol packet Commonly known as:  MIRALAX / GLYCOLAX Take 17 g by mouth daily.   prochlorperazine 10 MG tablet Commonly known as:  COMPAZINE Take 1 tablet (10 mg total) by mouth every 6 (six) hours as needed (Nausea or vomiting).   STOOL SOFTENER 100 MG capsule Generic drug:  docusate sodium Take 100 mg by mouth daily.   tamsulosin 0.4 MG Caps capsule Commonly known as:  FLOMAX Take 1 capsule (0.4 mg total) by mouth daily.       No Known Allergies  Consultations:  Gastroenterology  Oncology, Dr. Marin Olp  Interventional radiology  General surgery   Procedures/Studies: Ct Abdomen Pelvis W Contrast  Result Date: 03/20/2018 CLINICAL DATA:  Gastric adenocarcinoma with biliary obstruction  and gastric outlet obstruction. Increasing LFTs. Evaluate for biliary dilatation and/or liver abscess. EXAM: CT ABDOMEN AND PELVIS WITH CONTRAST TECHNIQUE: Multidetector CT imaging of the abdomen and pelvis was performed using the standard protocol following bolus administration of intravenous contrast. CONTRAST:  100 mL Isovue 300 IV COMPARISON:  MRI abdomen dated 03/14/2018 FINDINGS: Lower chest: Trace bilateral pleural effusions. Bilateral lower lobe atelectasis, right greater than left. Hepatobiliary: Liver is grossly unremarkable. No suspicious/enhancing hepatic lesions. No evidence of liver abscess. Stable nodular soft tissue along the inferior wall of the gallbladder fundus (series 2/image 38), non FDG avid on PET. No intrahepatic or extrahepatic ductal dilatation status post  external biliary drain placement, terminating in the distal 2nd/proximal 3rd portion of the duodenum. Pancreas: No pancreatic mass, atrophy, or ductal dilatation. Mild stranding along the anterior aspect of the pancreatic body/tail, unchanged. Spleen: Within normal limits. Adrenals/Urinary Tract: Adrenal glands are within normal limits. Multiple bilateral nonobstructing renal calculi, measuring up to 5 mm in the anterior right upper kidney (series 2/image 49). No hydronephrosis. Bladder is within normal limits. Stomach/Bowel: Infiltrating masslike thickening of the distal stomach (series 2/image 38). Surrounding mesenteric stranding inferiorly (series 2/image 41). No evidence of bowel obstruction. Appendix is not discretely visualized. Left colonic diverticulosis, without evidence of diverticulitis. Vascular/Lymphatic: No evidence of abdominal aortic aneurysm. Atherosclerotic calcifications of the abdominal aorta and branch vessels. No suspicious abdominopelvic lymphadenopathy. Reproductive: Prostate is unremarkable. Other: Moderate abdominopelvic ascites.  No free air. Mild fat in the right inguinal canal. Musculoskeletal: Status post PLIF at L1-5. IMPRESSION: No residual intrahepatic/extrahepatic ductal dilatation status post external biliary drain placement. No evidence of liver lesion/abscess. Infiltrating masslike thickening of the distal stomach, unchanged. Additional stable ancillary findings as above. Electronically Signed   By: Julian Hy M.D.   On: 03/20/2018 22:24   Mr 3d Recon At Scanner  Result Date: 03/14/2018 CLINICAL DATA:  63 year old male with history of jaundice, abdominal pain and fever. EXAM: MRI ABDOMEN WITHOUT AND WITH CONTRAST (INCLUDING MRCP) TECHNIQUE: Multiplanar multisequence MR imaging of the abdomen was performed both before and after the administration of intravenous contrast. Heavily T2-weighted images of the biliary and pancreatic ducts were obtained, and three-dimensional  MRCP images were rendered by post processing. CONTRAST:  10 mL of Gadavist. COMPARISON:  None. FINDINGS: Lower chest: Trace bilateral pleural effusions.  Mild cardiomegaly. Hepatobiliary: Diffuse loss of signal intensity throughout the hepatic parenchyma on in phase dual echo images, and diffuse low signal intensity on T2 weighted sequences, indicative of significant hepatic iron deposition. Liver has a slightly shrunken appearance and nodular contour, suggesting underlying cirrhosis. Mild intrahepatic biliary ductal dilatation noted on MRCP images. Proximal common bile duct is not visualized on MRCP images (MRCP image 99 of series 5), with an appearance suggestive of extrinsic compression. Distal common bile duct is normal in caliber. No filling defect in the common bile duct to suggest choledocholithiasis. Gallbladder is unremarkable in appearance. Pancreas: Although the head and proximal body of the pancreas appears slightly deformed by mass effect from the adjacent gastric mass, there is no intrinsic pancreatic mass identified. No pancreatic ductal dilatation noted on MRCP images. Infiltration of the soft tissues adjacent to the pancreas, favored to be related to infiltrative gastric neoplasm, however, inflammation from pancreatitis would be difficult to entirely exclude. Spleen: Diffuse loss of signal intensity throughout the spleen on in phase dual echo images, and diffuse low signal intensity on T2 weighted sequences, indicative of iron deposition in the spleen. Adrenals/Urinary Tract: Bilateral kidneys and adrenal glands  are normal in appearance. No hydroureteronephrosis in the visualized portions of the abdomen. Stomach/Bowel: Again noted is profound mural thickening and luminal narrowing in the distal body, antrum and pyloric region of the stomach. This area demonstrates diffuse enhancement on post gadolinium images, and appears infiltrative into the surrounding soft tissues, particularly posteriorly  adjacent to the pancreas and extending up into the porta hepatis. This likely exerts mass effect upon the common bile duct accounting for the nonvisualized segment proximally. Vascular/Lymphatic: Aortic atherosclerosis, without evidence of aneurysm in the abdominal vasculature. Splenic vein, superior mesenteric vein, splenoportal confluence and portal vein all appear patent at this time. No lymphadenopathy is confidently identified in the abdomen (today's study is limited by some patient motion and imaging noise). Other:  Moderate volume of ascites. Musculoskeletal: Susceptibility artifact from the patient's indwelling spinal hardware. No aggressive appearing osseous lesions are noted in the visualized portions of the skeleton. IMPRESSION: 1. Continued mural thickening throughout the distal stomach, which now appears infiltrative into the adjacent soft tissues invading the porta hepatis where it exerts mass effect on the proximal common bile duct. At this time, this is associated with mild intrahepatic biliary ductal dilatation. 2. Iron deposition in the liver and spleen. 3. Moderate volume of ascites. 4. Trace bilateral pleural effusions lying dependently. 5. Aortic atherosclerosis. 6. Mild cardiomegaly. Electronically Signed   By: Vinnie Langton M.D.   On: 03/14/2018 14:00   Nm Pet Image Initial (pi) Skull Base To Thigh  Result Date: 02/27/2018 CLINICAL DATA:  Initial treatment strategy for gastric cancer. EXAM: NUCLEAR MEDICINE PET SKULL BASE TO THIGH TECHNIQUE: 11.35 mCi F-18 FDG was injected intravenously. Full-ring PET imaging was performed from the skull base to thigh after the radiotracer. CT data was obtained and used for attenuation correction and anatomic localization. Fasting blood glucose: 114 mg/dl COMPARISON:  CT abdomen/pelvis dated 02/03/2018 FINDINGS: Mediastinal blood pool activity: SUV max 3.1 NECK: No hypermetabolic cervical lymphadenopathy. Incidental CT findings: none CHEST: No  suspicious pulmonary nodules. No hypermetabolic thoracic lymphadenopathy. Incidental CT findings: Mild atherosclerotic calcifications of the aortic arch. Three vessel coronary atherosclerosis. ABDOMEN/PELVIS: Focal hypermetabolism along the distal lesser curvature and gastric antrum of the stomach, corresponding to the patient's known infiltrating gastric neoplasm, max SUV 6.2. Adjacent mild mesenteric stranding inferiorly and along the proximal duodenum. 11 mm short axis node along hepatoduodenal ligament (series 3/image 141), max SUV 5.3. Additional mild hypermetabolism anterior to the second portion of the duodenum, max SUV 5.6, without corresponding lymph node. 8 mm short axis portacaval node (series 3/image 144), max SUV 3.9. Trace ascites along the left pericolic gutter. Small volume pelvic ascites. No frank peritoneal nodularity or omental caking. Incidental CT findings: Atherosclerotic calcifications the abdominal aorta and branch vessels. Sigmoid diverticulosis, without evidence of diverticulitis. SKELETON: No focal hypermetabolic activity to suggest skeletal metastasis. Incidental CT findings: none IMPRESSION: Hypermetabolism along the distal lesser curvature and gastric antrum of the stomach, corresponding to the patient's known infiltrating gastric neoplasm. Small upper abdominal lymph nodes with surrounding mesenteric stranding, as above. Small volume abdominopelvic ascites. No frank peritoneal nodularity or omental caking. Electronically Signed   By: Julian Hy M.D.   On: 02/27/2018 12:33   Dg Chest Port 1 View  Result Date: 03/19/2018 CLINICAL DATA:  63 year old male with shortness of breath. Gastric cancer. Recently placed internal external biliary drain. EXAM: PORTABLE CHEST 1 VIEW COMPARISON:  Chest radiographs 09/16/2016. FINDINGS: Portable AP semi upright view at 1627 hours. Right chest IJ approach power port in place and accessed.  Normal cardiac size and mediastinal contours.  Visualized tracheal air column is within normal limits. No pneumothorax, pulmonary edema or pleural effusion. Mild lung base atelectasis greater on the left. Right upper quadrant catheter is partially visible. Paucity of bowel gas in the upper abdomen. No acute osseous abnormality identified. IMPRESSION: Mild atelectasis, otherwise no acute cardiopulmonary abnormality. Electronically Signed   By: Genevie Ann M.D.   On: 03/19/2018 16:58   Dg Abd 2 Views  Result Date: 03/13/2018 CLINICAL DATA:  Right upper quadrant pain for 6 weeks EXAM: ABDOMEN - 2 VIEW COMPARISON:  None. FINDINGS: There is no free intraperitoneal gas. Nonspecific air-fluid levels within small and large bowel are noted. There are mildly distended loops of small bowel in the left upper quadrant. Spinal stabilization hardware is present from L1 through L5. IMPRESSION: There are dilated small bowel loops in the left hemiabdomen. Developing small bowel obstruction versus ileus could not be excluded. No free intraperitoneal gas. Electronically Signed   By: Marybelle Killings M.D.   On: 03/13/2018 18:29   Mr Abdomen Mrcp Moise Boring Contast  Result Date: 03/14/2018 CLINICAL DATA:  63 year old male with history of jaundice, abdominal pain and fever. EXAM: MRI ABDOMEN WITHOUT AND WITH CONTRAST (INCLUDING MRCP) TECHNIQUE: Multiplanar multisequence MR imaging of the abdomen was performed both before and after the administration of intravenous contrast. Heavily T2-weighted images of the biliary and pancreatic ducts were obtained, and three-dimensional MRCP images were rendered by post processing. CONTRAST:  10 mL of Gadavist. COMPARISON:  None. FINDINGS: Lower chest: Trace bilateral pleural effusions.  Mild cardiomegaly. Hepatobiliary: Diffuse loss of signal intensity throughout the hepatic parenchyma on in phase dual echo images, and diffuse low signal intensity on T2 weighted sequences, indicative of significant hepatic iron deposition. Liver has a slightly shrunken  appearance and nodular contour, suggesting underlying cirrhosis. Mild intrahepatic biliary ductal dilatation noted on MRCP images. Proximal common bile duct is not visualized on MRCP images (MRCP image 99 of series 5), with an appearance suggestive of extrinsic compression. Distal common bile duct is normal in caliber. No filling defect in the common bile duct to suggest choledocholithiasis. Gallbladder is unremarkable in appearance. Pancreas: Although the head and proximal body of the pancreas appears slightly deformed by mass effect from the adjacent gastric mass, there is no intrinsic pancreatic mass identified. No pancreatic ductal dilatation noted on MRCP images. Infiltration of the soft tissues adjacent to the pancreas, favored to be related to infiltrative gastric neoplasm, however, inflammation from pancreatitis would be difficult to entirely exclude. Spleen: Diffuse loss of signal intensity throughout the spleen on in phase dual echo images, and diffuse low signal intensity on T2 weighted sequences, indicative of iron deposition in the spleen. Adrenals/Urinary Tract: Bilateral kidneys and adrenal glands are normal in appearance. No hydroureteronephrosis in the visualized portions of the abdomen. Stomach/Bowel: Again noted is profound mural thickening and luminal narrowing in the distal body, antrum and pyloric region of the stomach. This area demonstrates diffuse enhancement on post gadolinium images, and appears infiltrative into the surrounding soft tissues, particularly posteriorly adjacent to the pancreas and extending up into the porta hepatis. This likely exerts mass effect upon the common bile duct accounting for the nonvisualized segment proximally. Vascular/Lymphatic: Aortic atherosclerosis, without evidence of aneurysm in the abdominal vasculature. Splenic vein, superior mesenteric vein, splenoportal confluence and portal vein all appear patent at this time. No lymphadenopathy is confidently  identified in the abdomen (today's study is limited by some patient motion and imaging noise). Other:  Moderate volume of ascites. Musculoskeletal: Susceptibility artifact from the patient's indwelling spinal hardware. No aggressive appearing osseous lesions are noted in the visualized portions of the skeleton. IMPRESSION: 1. Continued mural thickening throughout the distal stomach, which now appears infiltrative into the adjacent soft tissues invading the porta hepatis where it exerts mass effect on the proximal common bile duct. At this time, this is associated with mild intrahepatic biliary ductal dilatation. 2. Iron deposition in the liver and spleen. 3. Moderate volume of ascites. 4. Trace bilateral pleural effusions lying dependently. 5. Aortic atherosclerosis. 6. Mild cardiomegaly. Electronically Signed   By: Vinnie Langton M.D.   On: 03/14/2018 14:00   Ir Cholangiogram Existing Tube  Result Date: 03/20/2018 INDICATION: Biliary obstruction, status post internal/external biliary drain catheter placement 04/03/2018. Persistent elevation of bilirubin. Assess drain function. EXAM: CHOLANGIOGRAM THROUGH EXISTING CATHETER MEDICATIONS: NONE INDICATED ANESTHESIA/SEDATION: None required FLUOROSCOPY TIME:  0.8 minute; 865  uGym2 DAP COMPLICATIONS: None immediate. PROCEDURE: Informed written consent was obtained from the patient after a thorough discussion of the procedural risks, benefits and alternatives. All questions were addressed. A timeout was performed prior to the initiation of the procedure. Scout radiographic image obtained. This demonstrated stable position of the internal/external biliary drain catheter compared to placement images from 03/17/2018. Contrast was injected through the catheter. This opacified left biliary tree and portion of the right biliary tree. The inferior right intrahepatic biliary tree was not opacified, despite changes in patient positioning. Catheter is patent into the duodenum.  The duodenum is decompressed and unremarkable. The catheter was flushed and returned to external drainage. IMPRESSION: 1. Internal/external biliary drain catheter remains in stable position, patent, with decompression of visualized intrahepatic biliary ducts. 2. Nonvisualization of portions of the right biliary tree suggesting possibility of central intrahepatic segmental biliary obstruction. MRCP may be useful for further evaluation. Findings were reviewed with Dr. Ardis Hughs. Electronically Signed   By: Lucrezia Europe M.D.   On: 03/20/2018 15:33   Ir Biliary Drain Placement With Cholangiogram  Result Date: 03/17/2018 INDICATION: Gastric cancer, obstructive jaundice, failed ERCP EXAM: ULTRASOUND AND FLUOROSCOPIC RIGHT INTERNAL EXTERNAL 10 FRENCH BILIARY DRAIN MEDICATIONS: 3.375 G ZOSYN; The antibiotic was administered within an appropriate time frame prior to the initiation of the procedure. ANESTHESIA/SEDATION: Moderate (conscious) sedation was employed during this procedure. A total of Versed 6.0 mg and Fentanyl 300 mcg was administered intravenously. Moderate Sedation Time: 43 minutes. The patient's level of consciousness and vital signs were monitored continuously by radiology nursing throughout the procedure under my direct supervision. FLUOROSCOPY TIME:  Fluoroscopy Time: 5 minutes 48 seconds (124 mGy). COMPLICATIONS: None immediate. PROCEDURE: Informed written consent was obtained from the patient after a thorough discussion of the procedural risks, benefits and alternatives. All questions were addressed. Maximal Sterile Barrier Technique was utilized including caps, mask, sterile gowns, sterile gloves, sterile drape, hand hygiene and skin antiseptic. A timeout was performed prior to the initiation of the procedure. Previous imaging reviewed. Preliminary ultrasound performed. The right hepatic lobe was localized in the mid axillary line through a lower intercostal space. Overlying skin marked. 21 gauge access  needle was advanced into the liver under ultrasound. Approximately 5 passes made through the liver. Eventually a peripheral hepatic duct was opacified. Limited cholangiogram performed. Proximal CBD obstruction confirmed. Images obtained for documentation. 21 gauge needle adjusted to access the peripheral right hepatic duct more securely. This was done under rotational fluoroscopy. 018 guidewire advanced into the bile duct system easily. Accustick dilator exchange performed. Amplatz guidewire advanced into the  duodenum. Tract dilatation performed to insert a 10 [place] internal external biliary drain. Retention loop formed in the duodenum. Contrast injection confirms position and adequate biliary drainage through the proximal CBD obstruction. Catheter secured with Prolene suture and connected to external gravity drainage. Sterile dressing applied. No immediate complication. Patient tolerated the procedure well. IMPRESSION: Successful ultrasound and fluoroscopic right 10 French internal external biliary drain. Electronically Signed   By: Jerilynn Mages.  Shick M.D.   On: 03/17/2018 12:19   Ir Imaging Guided Port Insertion  Result Date: 03/04/2018 CLINICAL DATA:  Gastric carcinoma, needs durable venous access for planned chemotherapy regimen. EXAM: TUNNELED PORT CATHETER PLACEMENT WITH ULTRASOUND AND FLUOROSCOPIC GUIDANCE FLUOROSCOPY TIME:  0.1 minute; 33  uGym2 DAP ANESTHESIA/SEDATION: Intravenous Fentanyl and Versed were administered as conscious sedation during continuous monitoring of the patient's level of consciousness and physiological / cardiorespiratory status by the radiology RN, with a total moderate sedation time of 17 minutes. TECHNIQUE: The procedure, risks, benefits, and alternatives were explained to the patient. Questions regarding the procedure were encouraged and answered. The patient understands and consents to the procedure. As antibiotic prophylaxis, cefazolin 2 g was ordered pre-procedure and administered  intravenously within one hour of incision. Patency of the right IJ vein was confirmed with ultrasound with image documentation. An appropriate skin site was determined. Skin site was marked. Region was prepped using maximum barrier technique including cap and mask, sterile gown, sterile gloves, large sterile sheet, and Chlorhexidine as cutaneous antisepsis. The region was infiltrated locally with 1% lidocaine. Under real-time ultrasound guidance, the right IJ vein was accessed with a 21 gauge micropuncture needle; the needle tip within the vein was confirmed with ultrasound image documentation. Needle was exchanged over a 018 guidewire for transitional dilator which allowed passage of the [facility] wire into the IVC. Over this, the transitional dilator was exchanged for a 5 [place] MPA catheter. A small incision was made on the right anterior chest wall and a subcutaneous pocket fashioned. The power-injectable port was positioned and its catheter tunneled to the right IJ dermatotomy site. The MPA catheter was exchanged over an Amplatz wire for a peel-away sheath, through which the port catheter, which had been trimmed to the appropriate length, was advanced and positioned under fluoroscopy with its tip at the cavoatrial junction. Spot chest radiograph confirms good catheter position and no pneumothorax. The pocket was closed with deep interrupted and subcuticular continuous 3-0 Monocryl sutures. The port was flushed per protocol. The incisions were covered with Dermabond then covered with a sterile dressing. COMPLICATIONS: COMPLICATIONS None immediate IMPRESSION: Technically successful right IJ power-injectable port catheter placement. Ready for routine use. Electronically Signed   By: Lucrezia Europe M.D.   On: 03/04/2018 17:12   US Abdomen Limited Ruq  Result Date: 03/13/2018 CLINICAL DATA:  Elevated LFTs. EXAM: ULTRASOUND ABDOMEN LIMITED RIGHT UPPER QUADRANT COMPARISON:  CT scan 02/03/2018 FINDINGS: Gallbladder:  Gallbladder fundus not well seen due to overlying bowel gas. Gallbladder wall appears thickened at 4 mm and appeared prominent on prior CT is well. Tiny calcified stone seen on the previous CT scan not evident on ultrasound today. Common bile duct: Diameter: 7 mm Liver: Increased echogenicity of liver parenchyma with decreased acoustic through transmission suggests fatty deposition. Mild intrahepatic biliary duct dilatation evident. Small volume ascites evident. Portal vein is patent on color Doppler imaging with normal direction of blood flow towards the liver. IMPRESSION: Nondistended gallbladder with mild gallbladder wall thickening. Tiny calcified gallstones seen on previous CT scan not readily evident on today's  ultrasound. Mild intra and extrahepatic biliary duct dilatation. Small volume ascites. Electronically Signed   By: Misty Stanley M.D.   On: 03/13/2018 19:01     03/15/2018 ERCP: Aborted due to significant stomach contents  03/17/2018 IR guided biliary drain placement   Subjective:   Discharge Exam: Vitals:   03/24/18 0502 03/24/18 1431  BP: 136/85 139/85  Pulse: 72 76  Resp: 18 20  Temp: 98.4 F (36.9 C) 98.7 F (37.1 C)  SpO2: 95% 97%   Vitals:   03/23/18 2103 03/24/18 0500 03/24/18 0502 03/24/18 1431  BP: 132/85  136/85 139/85  Pulse: 72  72 76  Resp: 18  18 20   Temp: 98.6 F (37 C)  98.4 F (36.9 C) 98.7 F (37.1 C)  TempSrc: Oral  Oral Oral  SpO2: 95%  95% 97%  Weight:  88.5 kg    Height:       General exam: Appears calm mildly uncomfortable Respiratory system: Clear to auscultation. Respiratory effort normal. Cardiovascular system: Regular rate and rhythm, no murmurs Gastrointestinal system: Soft, mildly tender in right upper quadrant, no rebound or guarding, plus bowel sounds. Central nervous system: Alert and oriented.  Grossly intact, moving all extremities Extremities: Trace lower extremity edema. Skin: Grossly icteric, percutaneous biliary drain site is  clean dry and intact, port site is clean dry and intact Psychiatry: Judgement and insight appear normal. Mood & affect appropriate.    The results of significant diagnostics from this hospitalization (including imaging, microbiology, ancillary and laboratory) are listed below for reference.     Microbiology: Recent Results (from the past 240 hour(s))  Culture, blood (routine x 2)     Status: Abnormal   Collection Time: 03/19/18  3:24 PM  Result Value Ref Range Status   Specimen Description   Final    BLOOD RIGHT ARM Performed at [facility], [place] [address], [place], [place] [address]    Special Requests   Final    BOTTLES DRAWN AEROBIC AND ANAEROBIC Blood Culture adequate volume Performed at [facility] [address] [place], [place] [address]    Culture  Setup Time   Final    GRAM NEGATIVE RODS IN BOTH AEROBIC AND ANAEROBIC BOTTLES CRITICAL VALUE NOTED.  VALUE IS CONSISTENT WITH PREVIOUSLY REPORTED AND CALLED VALUE.    Culture (A)  Final    ESCHERICHIA COLI SUSCEPTIBILITIES PERFORMED ON PREVIOUS CULTURE WITHIN THE LAST 5 DAYS. Performed at [facility], [place] [address], [place], [place] [address]    Report Status 03/22/2018 FINAL  Final  Culture, blood (routine x 2)     Status: Abnormal   Collection Time: 03/19/18  3:33 PM  Result Value Ref Range Status   Specimen Description   Final    BLOOD RIGHT HAND Performed at [facility] [address], [place], [place] [address]    Special Requests   Final    BOTTLES DRAWN AEROBIC AND ANAEROBIC Blood Culture adequate volume Performed at [facility] [address], [place], [place] [address]    Culture  Setup Time   Final    GRAM NEGATIVE RODS IN BOTH AEROBIC AND ANAEROBIC BOTTLES CRITICAL RESULT CALLED TO, READ BACK BY AND VERIFIED WITH: Damian Leavell [id number] [phone] Performed at [facility], [place] [address], [place], [place]  [address]    Culture ESCHERICHIA COLI (A)  Final   Report Status 03/22/2018 FINAL  Final   Organism ID, Bacteria ESCHERICHIA  COLI  Final      Susceptibility   Escherichia coli - MIC*    AMPICILLIN <=2 SENSITIVE Sensitive     CEFAZOLIN <=4 SENSITIVE Sensitive     CEFEPIME <=1 SENSITIVE Sensitive     CEFTAZIDIME <=1 SENSITIVE Sensitive     CEFTRIAXONE <=1 SENSITIVE Sensitive     CIPROFLOXACIN <=0.25 SENSITIVE Sensitive     GENTAMICIN <=1 SENSITIVE Sensitive     IMIPENEM <=0.25 SENSITIVE Sensitive     TRIMETH/SULFA <=20 SENSITIVE Sensitive     AMPICILLIN/SULBACTAM <=2 SENSITIVE Sensitive     PIP/TAZO <=4 SENSITIVE Sensitive     Extended ESBL NEGATIVE Sensitive     * ESCHERICHIA COLI  Blood Culture ID Panel (Reflexed)     Status: Abnormal   Collection Time: 03/19/18  3:33 PM  Result Value Ref Range Status   Enterococcus species NOT DETECTED NOT DETECTED Final   Listeria monocytogenes NOT DETECTED NOT DETECTED Final   Staphylococcus species NOT DETECTED NOT DETECTED Final   Staphylococcus aureus (BCID) NOT DETECTED NOT DETECTED Final   Streptococcus species NOT DETECTED NOT DETECTED Final   Streptococcus agalactiae NOT DETECTED NOT DETECTED Final   Streptococcus pneumoniae NOT DETECTED NOT DETECTED Final   Streptococcus pyogenes NOT DETECTED NOT DETECTED Final   Acinetobacter baumannii NOT DETECTED NOT DETECTED Final   Enterobacteriaceae species DETECTED (A) NOT DETECTED Final    Comment: Enterobacteriaceae represent a large family of gram-negative bacteria, not a single organism. CRITICAL RESULT CALLED TO, READ BACK BY AND VERIFIED WITH: PHARMD J GADHIA [phone] MLM    Enterobacter cloacae complex NOT DETECTED NOT DETECTED Final   Escherichia coli DETECTED (A) NOT DETECTED Final    Comment: CRITICAL RESULT CALLED TO, READ BACK BY AND VERIFIED WITH: PHARMD J GADHIA [phone] MLM    Klebsiella oxytoca NOT DETECTED NOT DETECTED Final   Klebsiella pneumoniae NOT DETECTED NOT  DETECTED Final   Proteus species NOT DETECTED NOT DETECTED Final   Serratia marcescens NOT DETECTED NOT DETECTED Final   Carbapenem resistance NOT DETECTED NOT DETECTED Final   Haemophilus influenzae NOT DETECTED NOT DETECTED Final   Neisseria meningitidis NOT DETECTED NOT DETECTED Final   Pseudomonas aeruginosa NOT DETECTED NOT DETECTED Final   Candida albicans NOT DETECTED NOT DETECTED Final   Candida glabrata NOT DETECTED NOT DETECTED Final   Candida krusei NOT DETECTED NOT DETECTED Final   Candida parapsilosis NOT DETECTED NOT DETECTED Final   Candida tropicalis NOT DETECTED NOT DETECTED Final    Comment: Performed at [facility], [place] [address], [place], [place] [address]  Body fluid culture     Status: None   Collection Time: 03/21/18  2:20 PM  Result Value Ref Range Status   Specimen Description   Final    BILE Performed at [facility] [address] [place], [place] [address]    Special Requests   Final    NONE Performed at [facility], [place] [address], [place], [place] [address]    Gram Stain   Final    NO WBC SEEN FEW YEAST FEW GRAM NEGATIVE RODS RARE GRAM POSITIVE RODS    Culture   Final    FEW ENTEROBACTER CLOACAE MODERATE CANDIDA GLABRATA RARE BACILLUS SPECIES Standardized susceptibility testing for this organism is not available. Performed at [facility], [place] [address], [place], [place] [address]    Report Status 03/24/2018 FINAL  Final   Organism ID, Bacteria ENTEROBACTER CLOACAE  Final  Susceptibility   Enterobacter cloacae - MIC*    CEFAZOLIN >=64 RESISTANT Resistant     CEFEPIME <=1 SENSITIVE Sensitive     CEFTAZIDIME >=64 RESISTANT Resistant     CEFTRIAXONE >=64 RESISTANT Resistant     CIPROFLOXACIN <=0.25 SENSITIVE Sensitive     GENTAMICIN >=16 RESISTANT Resistant     IMIPENEM <=0.25 SENSITIVE Sensitive     TRIMETH/SULFA >=320 RESISTANT Resistant     PIP/TAZO >=128 RESISTANT Resistant      * FEW ENTEROBACTER CLOACAE     Labs: BNP (last 3 results) No results for input(s): BNP in the last 8760 hours. Basic Metabolic Panel: Recent Labs  Lab 03/20/18 0400 03/21/18 0409 03/22/18 0318 03/23/18 0346 03/24/18 0413  NA 135 135 134* 138 138  K 3.8 3.8 3.1* 3.5 3.6  CL 100 100 99 104 105  CO2 24 23 24 26 25   GLUCOSE 132* 134* 127* 109* 102*  BUN 10 16 15 13 12   CREATININE 0.64 0.82 0.66 0.53* 0.52*  CALCIUM 8.1* 8.0* 8.3* 8.4* 8.3*  MG 2.2  --   --   --   --    Liver Function Tests: Recent Labs  Lab 03/20/18 0400 03/21/18 0409 03/22/18 0318 03/23/18 0346 03/24/18 0413  AST 87* 66* 45* 46* 45*  ALT 121* 98* 77* 69* 66*  ALKPHOS 269* 218* 218* 202* 183*  BILITOT 12.5* 12.6* 12.7* 12.6* 11.4*  PROT 5.5* 5.5* 6.0* 6.0* 5.7*  ALBUMIN 2.2* 2.1* 2.2* 2.2* 2.1*   No results for input(s): LIPASE, AMYLASE in the last 168 hours. No results for input(s): AMMONIA in the last 168 hours. CBC: Recent Labs  Lab 03/20/18 0400 03/21/18 0409 03/22/18 0318 03/23/18 0346 03/24/18 0413  WBC 10.2 10.6* 6.2 5.5 6.3  NEUTROABS  --  9.2* 5.3 4.0 4.3  HGB 8.5* 8.3* 10.2* 10.6* 10.5*  HCT 26.0* 26.0* 31.3* 33.4* 32.7*  MCV 84.7 86.1 85.3 85.9 86.5  PLT 314 271 250 242 247   Cardiac Enzymes: Recent Labs  Lab 03/19/18 1525 03/19/18 2226 03/20/18 0400  TROPONINI <0.03 <0.03 <0.03   BNP: Invalid input(s): POCBNP CBG: No results for input(s): GLUCAP in the last 168 hours. D-Dimer No results for input(s): DDIMER in the last 72 hours. Hgb A1c No results for input(s): HGBA1C in the last 72 hours. Lipid Profile No results for input(s): CHOL, HDL, LDLCALC, TRIG, CHOLHDL, LDLDIRECT in the last 72 hours. Thyroid function studies No results for input(s): TSH, T4TOTAL, T3FREE, THYROIDAB in the last 72 hours.  Invalid input(s): FREET3 Anemia work up No results for input(s): VITAMINB12, FOLATE, FERRITIN, TIBC, IRON, RETICCTPCT in the last 72 hours. Urinalysis    Component  Value Date/Time   COLORURINE YELLOW 09/18/2015 0941   APPEARANCEUR CLEAR 09/18/2015 0941   LABSPEC 1.024 09/18/2015 0941   PHURINE 6.0 09/18/2015 0941   GLUCOSEU NEGATIVE 09/18/2015 0941   HGBUR NEGATIVE 09/18/2015 0941   BILIRUBINUR NEGATIVE 09/18/2015 0941   KETONESUR NEGATIVE 09/18/2015 0941   PROTEINUR NEGATIVE 09/18/2015 0941   NITRITE NEGATIVE 09/18/2015 0941   LEUKOCYTESUR NEGATIVE 09/18/2015 0941   Sepsis Labs Invalid input(s): PROCALCITONIN,  WBC,  LACTICIDVEN Microbiology Recent Results (from the past 240 hour(s))  Culture, blood (routine x 2)     Status: Abnormal   Collection Time: 03/19/18  3:24 PM  Result Value Ref Range Status   Specimen Description   Final    BLOOD RIGHT ARM Performed at [facility], [place] [address], [place], [place] [address]    Special  Requests   Final    BOTTLES DRAWN AEROBIC AND ANAEROBIC Blood Culture adequate volume Performed at [facility] [address] [place], [place] [address]    Culture  Setup Time   Final    GRAM NEGATIVE RODS IN BOTH AEROBIC AND ANAEROBIC BOTTLES CRITICAL VALUE NOTED.  VALUE IS CONSISTENT WITH PREVIOUSLY REPORTED AND CALLED VALUE.    Culture (A)  Final    ESCHERICHIA COLI SUSCEPTIBILITIES PERFORMED ON PREVIOUS CULTURE WITHIN THE LAST 5 DAYS. Performed at [facility], [place] [address] [place], [place] [address]    Report Status 03/22/2018 FINAL  Final  Culture, blood (routine x 2)     Status: Abnormal   Collection Time: 03/19/18  3:33 PM  Result Value Ref Range Status   Specimen Description   Final    BLOOD RIGHT HAND Performed at [facility] [address] [place], [place] [address]    Special Requests   Final    BOTTLES DRAWN AEROBIC AND ANAEROBIC Blood Culture adequate volume Performed at [facility] [address], [place], [place] [address]    Culture  Setup Time   Final    GRAM NEGATIVE RODS IN BOTH AEROBIC  AND ANAEROBIC BOTTLES CRITICAL RESULT CALLED TO, READ BACK BY AND VERIFIED WITH: Damian Leavell [phone] MLM Performed at [facility], [place] [address], [place], [place] [address]    Culture ESCHERICHIA COLI (A)  Final   Report Status 03/22/2018 FINAL  Final   Organism ID, Bacteria ESCHERICHIA COLI  Final      Susceptibility   Escherichia coli - MIC*    AMPICILLIN <=2 SENSITIVE Sensitive     CEFAZOLIN <=4 SENSITIVE Sensitive     CEFEPIME <=1 SENSITIVE Sensitive     CEFTAZIDIME <=1 SENSITIVE Sensitive     CEFTRIAXONE <=1 SENSITIVE Sensitive     CIPROFLOXACIN <=0.25 SENSITIVE Sensitive     GENTAMICIN <=1 SENSITIVE Sensitive     IMIPENEM <=0.25 SENSITIVE Sensitive     TRIMETH/SULFA <=20 SENSITIVE Sensitive     AMPICILLIN/SULBACTAM <=2 SENSITIVE Sensitive     PIP/TAZO <=4 SENSITIVE Sensitive     Extended ESBL NEGATIVE Sensitive     * ESCHERICHIA COLI  Blood Culture ID Panel (Reflexed)     Status: Abnormal   Collection Time: 03/19/18  3:33 PM  Result Value Ref Range Status   Enterococcus species NOT DETECTED NOT DETECTED Final   Listeria monocytogenes NOT DETECTED NOT DETECTED Final   Staphylococcus species NOT DETECTED NOT DETECTED Final   Staphylococcus aureus (BCID) NOT DETECTED NOT DETECTED Final   Streptococcus species NOT DETECTED NOT DETECTED Final   Streptococcus agalactiae NOT DETECTED NOT DETECTED Final   Streptococcus pneumoniae NOT DETECTED NOT DETECTED Final   Streptococcus pyogenes NOT DETECTED NOT DETECTED Final   Acinetobacter baumannii NOT DETECTED NOT DETECTED Final   Enterobacteriaceae species DETECTED (A) NOT DETECTED Final    Comment: Enterobacteriaceae represent a large family of gram-negative bacteria, not a single organism. CRITICAL RESULT CALLED TO, READ BACK BY AND VERIFIED WITH: PHARMD J GADHIA [phone] MLM    Enterobacter cloacae complex NOT DETECTED NOT DETECTED Final   Escherichia coli DETECTED (A) NOT DETECTED Final    Comment: CRITICAL  RESULT CALLED TO, READ BACK BY AND VERIFIED WITH: PHARMD J GADHIA [phone] MLM    Klebsiella oxytoca NOT DETECTED NOT DETECTED Final   Klebsiella pneumoniae NOT DETECTED NOT DETECTED Final   Proteus species NOT DETECTED  NOT DETECTED Final   Serratia marcescens NOT DETECTED NOT DETECTED Final   Carbapenem resistance NOT DETECTED NOT DETECTED Final   Haemophilus influenzae NOT DETECTED NOT DETECTED Final   Neisseria meningitidis NOT DETECTED NOT DETECTED Final   Pseudomonas aeruginosa NOT DETECTED NOT DETECTED Final   Candida albicans NOT DETECTED NOT DETECTED Final   Candida glabrata NOT DETECTED NOT DETECTED Final   Candida krusei NOT DETECTED NOT DETECTED Final   Candida parapsilosis NOT DETECTED NOT DETECTED Final   Candida tropicalis NOT DETECTED NOT DETECTED Final    Comment: Performed at [facility], [place] [address], [place], [place] [address]  Body fluid culture     Status: None   Collection Time: 03/21/18  2:20 PM  Result Value Ref Range Status   Specimen Description   Final    BILE Performed at [facility] [address], [place], [place] [address]    Special Requests   Final    NONE Performed at [facility], [place] [address], [place], [place] [address]    Gram Stain   Final    NO WBC SEEN FEW YEAST FEW GRAM NEGATIVE RODS RARE GRAM POSITIVE RODS    Culture   Final    FEW ENTEROBACTER CLOACAE MODERATE CANDIDA GLABRATA RARE BACILLUS SPECIES Standardized susceptibility testing for this organism is not available. Performed at [facility], [place] [address], [place],  [address]    Report Status 03/24/2018 FINAL  Final   Organism ID, Bacteria ENTEROBACTER CLOACAE  Final      Susceptibility   Enterobacter cloacae - MIC*    CEFAZOLIN >=64 RESISTANT Resistant     CEFEPIME <=1 SENSITIVE Sensitive     CEFTAZIDIME >=64 RESISTANT Resistant     CEFTRIAXONE >=64 RESISTANT Resistant     CIPROFLOXACIN <=0.25 SENSITIVE  Sensitive     GENTAMICIN >=16 RESISTANT Resistant     IMIPENEM <=0.25 SENSITIVE Sensitive     TRIMETH/SULFA >=320 RESISTANT Resistant     PIP/TAZO >=128 RESISTANT Resistant     * FEW ENTEROBACTER CLOACAE     Time coordinating discharge: 35  SIGNED:   Cristy Folks, MD  [facility] 03/24/2018, 3:38 PM Pager   If 7PM-7AM, please contact night-coverage [email or web address] Password [email or web address]

## 2018-03-24 NOTE — Progress Notes (Signed)
PROGRESS NOTE    Steven Ortega  LSL:[id number] DOB: [birth year] DOA: 03/13/2018 PCP: Christain Sacramento, MD   Brief Narrative:  63 year old with past medical history relevant for recent diagnosis of gastric cancer complicated by gastric outlet obstruction, hypothyroidism, BPH, pancreatic insufficiency, depression/anxiety, severe malnutrition, who presented with extrinsic compression of biliary duct due to progression of gastric cancer status post failed ERCP and IR guided biliary placement on 03/17/2018. He's also found sepsis due to cholangitis due to E. coli bacteremia.   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 coli bacteremia from a biliary source:  - pan-sensitive to antibiotics -Continue IV ceftriaxone started 03/23/2018 - Follow-up biliary bile culture  #) Acute on chronic anemia, requiring transfusion, no overt bleeding -S/p 2 units PRBC on 1/17 - continue to monitor Hb  #) Elevated LFTs/hyperbilirubinemia due to extrinsic compression of CBD by gastric cancer: Bilirubin is gradually decreasing as are his remaining LFTs. -Interventional radiology following, appreciate recommendations, biliary drain placed on 03/17/2018, cholangiogram through stent on 03/20/2018 showed stent was in good condition with decompression of biliary tree -General surgery consulted, no urgent surgical plan -Oncology following, appreciate recommendations -Family is requesting transfer to [facility] which oncology agrees with; transferring time deferred to oncology/GI/surgery.   #) Gastric cancer complicated by gastric outlet obstruction:  - GI soft diet initiated on 1/17; pt seems to improve appetite and can  tolerate more diet -cOntinue stool softener and PRN laxatives for mild constipation  #) Pancreatic insufficiency/slow motility: -Continue Creon with meals  #) Pain/psych: -Continue alprazolam 1 mg nightly -Continue bupropion 150 mg twice daily -Continue citalopram 40 mg daily -Continue PRN cyclobenzaprine  #) BPH: -Continue tamsulosin 0.4 mg daily  #) Hypothyroidism: -Continue levothyroxine 25 mcg every morning  Fluids: Tolerating p.o. Electrolytes: Monitor and supplement Nutrition: Full liquid diet  Prophylaxis: SCDs  Disposition: Pending resolution of biliary obstruction and possible transfer  Full code  Consultants:   Gastroenterology  Oncology, Dr. Marin Olp  Interventional radiology  General surgery  Procedures:   03/15/2018 ERCP: Aborted due to significant stomach contents  03/17/2018 IR guided biliary drain placement  Antimicrobials:   Iv Rocephin   Subjective: This morning patient reports feeling about the same.  His abdominal pain is somewhat better.  He denies any nausea, vomiting, diarrhea, cough, congestion, rhinorrhea.  Objective: Vitals:   03/23/18 1357 03/23/18 2103 03/24/18 0500 03/24/18 0502  BP: 130/82 132/85  136/85  Pulse: 69 72  72  Resp: 12 18  18   Temp: 98.4 F (36.9 C) 98.6 F (37 C)  98.4 F (36.9 C)  TempSrc: Oral Oral  Oral  SpO2: 94% 95%  95%  Weight:   88.5 kg   Height:        Intake/Output Summary (Last 24 hours) at 03/24/2018 1055 Last data filed at 03/24/2018 0612 Gross per 24 hour  Intake 310 ml  Output 275 ml  Net 35 ml   Filed Weights   03/21/18 0500 03/23/18 0649 03/24/18 0500  Weight: 89.4 kg 89.3 kg 88.5 kg    Examination:  General exam: Appears calm mildly uncomfortable Respiratory system: Clear to auscultation. Respiratory effort normal. Cardiovascular system: Regular rate and rhythm, no murmurs Gastrointestinal  system: Soft, mildly tender in right upper quadrant, no rebound or guarding, plus bowel  sounds. Central nervous system: Alert and oriented.  Grossly intact, moving all extremities Extremities: Trace lower extremity edema. Skin: Grossly icteric, percutaneous biliary drain site is clean dry and intact, port site is clean dry and intact Psychiatry: Judgement and insight appear normal. Mood & affect appropriate.     Data Reviewed: I have personally reviewed following labs and imaging studies  CBC: Recent Labs  Lab 03/20/18 0400 03/21/18 0409 03/22/18 0318 03/23/18 0346 03/24/18 0413  WBC 10.2 10.6* 6.2 5.5 6.3  NEUTROABS  --  9.2* 5.3 4.0 4.3  HGB 8.5* 8.3* 10.2* 10.6* 10.5*  HCT 26.0* 26.0* 31.3* 33.4* 32.7*  MCV 84.7 86.1 85.3 85.9 86.5  PLT 314 271 250 242 160   Basic Metabolic Panel: Recent Labs  Lab 03/20/18 0400 03/21/18 0409 03/22/18 0318 03/23/18 0346 03/24/18 0413  NA 135 135 134* 138 138  K 3.8 3.8 3.1* 3.5 3.6  CL 100 100 99 104 105  CO2 24 23 24 26 25   GLUCOSE 132* 134* 127* 109* 102*  BUN 10 16 15 13 12   CREATININE 0.64 0.82 0.66 0.53* 0.52*  CALCIUM 8.1* 8.0* 8.3* 8.4* 8.3*  MG 2.2  --   --   --   --    GFR: Estimated Creatinine Clearance: 102.4 mL/min (A) (by C-G formula based on SCr of 0.52 mg/dL (L)). Liver Function Tests: Recent Labs  Lab 03/20/18 0400 03/21/18 0409 03/22/18 0318 03/23/18 0346 03/24/18 0413  AST 87* 66* 45* 46* 45*  ALT 121* 98* 77* 69* 66*  ALKPHOS 269* 218* 218* 202* 183*  BILITOT 12.5* 12.6* 12.7* 12.6* 11.4*  PROT 5.5* 5.5* 6.0* 6.0* 5.7*  ALBUMIN 2.2* 2.1* 2.2* 2.2* 2.1*   No results for input(s): LIPASE, AMYLASE in the last 168 hours. No results for input(s): AMMONIA in the last 168 hours. Coagulation Profile: No results for input(s): INR, PROTIME in the last 168 hours. Cardiac Enzymes: Recent Labs  Lab 03/19/18 1525 03/19/18 2226 03/20/18 0400  TROPONINI <0.03 <0.03 <0.03   BNP (last 3 results) No results for input(s): PROBNP in the last 8760 hours. HbA1C: No results for input(s): HGBA1C in  the last 72 hours. CBG: No results for input(s): GLUCAP in the last 168 hours. Lipid Profile: No results for input(s): CHOL, HDL, LDLCALC, TRIG, CHOLHDL, LDLDIRECT in the last 72 hours. Thyroid Function Tests: No results for input(s): TSH, T4TOTAL, FREET4, T3FREE, THYROIDAB in the last 72 hours. Anemia Panel: No results for input(s): VITAMINB12, FOLATE, FERRITIN, TIBC, IRON, RETICCTPCT in the last 72 hours. Sepsis Labs: Recent Labs  Lab 03/19/18 1525 03/20/18 0400 03/21/18 0409  PROCALCITON 0.27 20.57 91.09    Recent Results (from the past 240 hour(s))  Culture, blood (routine x 2)     Status: Abnormal   Collection Time: 03/19/18  3:24 PM  Result Value Ref Range Status   Specimen Description   Final    BLOOD RIGHT ARM Performed at [facility] [address] [place], [place] [address]    Special Requests   Final    BOTTLES DRAWN AEROBIC AND ANAEROBIC Blood Culture adequate volume Performed at [facility] [address] [place], [place] [address]    Culture  Setup Time   Final    GRAM NEGATIVE RODS IN BOTH AEROBIC AND ANAEROBIC BOTTLES CRITICAL VALUE NOTED.  VALUE IS CONSISTENT WITH PREVIOUSLY REPORTED AND CALLED VALUE.    Culture (A)  Final    ESCHERICHIA COLI SUSCEPTIBILITIES PERFORMED ON PREVIOUS CULTURE WITHIN THE LAST 5 DAYS. Performed at [facility], [place] [address] [place], [place] [address]    Report Status 03/22/2018 FINAL  Final  Culture, blood (routine x 2)     Status: Abnormal   Collection Time: 03/19/18  3:33 PM  Result Value Ref Range Status   Specimen Description   Final    BLOOD RIGHT HAND Performed at [facility] [address] [place], [place] [address]    Special Requests   Final    BOTTLES DRAWN AEROBIC AND ANAEROBIC Blood Culture adequate volume Performed at [facility] [address] [place], [place] [address]    Culture  Setup Time   Final    GRAM  NEGATIVE RODS IN BOTH AEROBIC AND ANAEROBIC BOTTLES CRITICAL RESULT CALLED TO, READ BACK BY AND VERIFIED WITH: Damian Leavell [phone] MLM Performed at [facility], [place] [address], [place], [place] [address]    Culture ESCHERICHIA COLI (A)  Final   Report Status 03/22/2018 FINAL  Final   Organism ID, Bacteria ESCHERICHIA COLI  Final      Susceptibility   Escherichia coli - MIC*    AMPICILLIN <=2 SENSITIVE Sensitive     CEFAZOLIN <=4 SENSITIVE Sensitive     CEFEPIME <=1 SENSITIVE Sensitive     CEFTAZIDIME <=1 SENSITIVE Sensitive     CEFTRIAXONE <=1 SENSITIVE Sensitive     CIPROFLOXACIN <=0.25 SENSITIVE Sensitive     GENTAMICIN <=1 SENSITIVE Sensitive     IMIPENEM <=0.25 SENSITIVE Sensitive     TRIMETH/SULFA <=20 SENSITIVE Sensitive     AMPICILLIN/SULBACTAM <=2 SENSITIVE Sensitive     PIP/TAZO <=4 SENSITIVE Sensitive     Extended ESBL NEGATIVE Sensitive     * ESCHERICHIA COLI  Blood Culture ID Panel (Reflexed)     Status: Abnormal   Collection Time: 03/19/18  3:33 PM  Result Value Ref Range Status   Enterococcus species NOT DETECTED NOT DETECTED Final   Listeria monocytogenes NOT DETECTED NOT DETECTED Final   Staphylococcus species NOT DETECTED NOT DETECTED Final   Staphylococcus aureus (BCID) NOT DETECTED NOT DETECTED Final   Streptococcus species NOT DETECTED NOT DETECTED Final   Streptococcus agalactiae NOT DETECTED NOT DETECTED Final   Streptococcus pneumoniae NOT DETECTED NOT DETECTED Final   Streptococcus pyogenes NOT DETECTED NOT DETECTED Final   Acinetobacter baumannii NOT DETECTED NOT DETECTED Final   Enterobacteriaceae species DETECTED (A) NOT DETECTED Final    Comment: Enterobacteriaceae represent a large family of gram-negative bacteria, not a single organism. CRITICAL RESULT CALLED TO, READ BACK BY AND VERIFIED WITH: PHARMD J GADHIA [id number] MLM    Enterobacter cloacae complex NOT DETECTED NOT DETECTED Final   Escherichia coli DETECTED (A) NOT  DETECTED Final    Comment: CRITICAL RESULT CALLED TO, READ BACK BY AND VERIFIED WITH: PHARMD J GADHIA [phone] MLM    Klebsiella oxytoca NOT DETECTED NOT DETECTED Final   Klebsiella pneumoniae NOT DETECTED NOT DETECTED Final   Proteus species NOT DETECTED NOT DETECTED Final   Serratia marcescens NOT DETECTED NOT DETECTED Final   Carbapenem resistance NOT DETECTED NOT DETECTED Final   Haemophilus influenzae NOT DETECTED NOT DETECTED Final   Neisseria meningitidis NOT DETECTED NOT DETECTED Final   Pseudomonas aeruginosa NOT DETECTED NOT DETECTED Final   Candida albicans NOT DETECTED NOT DETECTED Final   Candida glabrata NOT DETECTED NOT DETECTED Final   Candida krusei  NOT DETECTED NOT DETECTED Final   Candida parapsilosis NOT DETECTED NOT DETECTED Final   Candida tropicalis NOT DETECTED NOT DETECTED Final    Comment: Performed at [facility], [place] [address] [place], [place] [address]  Body fluid culture     Status: None (Preliminary result)   Collection Time: 03/21/18  2:20 PM  Result Value Ref Range Status   Specimen Description   Final    BILE Performed at [facility] [address] [place], [place] [address]    Special Requests   Final    NONE Performed at [facility], [place] [address], [place], [place] [address]    Gram Stain   Final    NO WBC SEEN FEW YEAST FEW GRAM NEGATIVE RODS RARE GRAM POSITIVE RODS    Culture   Final    FEW ENTEROBACTER CLOACAE CULTURE REINCUBATED FOR BETTER GROWTH Performed at [facility], [place] [address] [place], [place] [address]    Report Status PENDING  Incomplete   Organism ID, Bacteria ENTEROBACTER CLOACAE  Final      Susceptibility   Enterobacter cloacae - MIC*    CEFAZOLIN >=64 RESISTANT Resistant     CEFEPIME <=1 SENSITIVE Sensitive     CEFTAZIDIME >=64 RESISTANT Resistant     CEFTRIAXONE >=64 RESISTANT Resistant     CIPROFLOXACIN <=0.25 SENSITIVE Sensitive     GENTAMICIN >=16  RESISTANT Resistant     IMIPENEM <=0.25 SENSITIVE Sensitive     TRIMETH/SULFA >=320 RESISTANT Resistant     PIP/TAZO >=128 RESISTANT Resistant     * FEW ENTEROBACTER CLOACAE         Radiology Studies: No results found.      Scheduled Meds: . ALPRAZolam  1 mg Oral QHS  . alum & mag hydroxide-simeth  30 mL Oral Q8H  . buPROPion  100 mg Oral BID  . citalopram  40 mg Oral Daily  . feeding supplement (ENSURE ENLIVE)  237 mL Oral TID  . heparin injection (subcutaneous)  5,000 Units Subcutaneous Q8H  . lipase/protease/amylase  36,000 Units Oral TID AC  . pantoprazole  40 mg Oral BID  . polyethylene glycol  17 g Oral BID  . senna-docusate  2 tablet Oral BID  . sodium chloride flush  10-40 mL Intracatheter Q12H  . sodium chloride flush  5 mL Intracatheter Q8H   Continuous Infusions: . sodium chloride 50 mL/hr at 03/23/18 2153  . cefTRIAXone (ROCEPHIN)  IV 2 g (03/24/18 0741)  . ondansetron (ZOFRAN) IV       LOS: 11 days    Time spent: [place], MD [facility]  If 7PM-7AM, please contact night-coverage [email or web address] Password [facility] 03/24/2018, 10:55 AM

## 2018-03-24 NOTE — Progress Notes (Signed)
PT Cancellation Note  Patient Details Name: Steven Ortega MRN: [id number] DOB: Jun 20, 1955   Cancelled Treatment:    Reason Eval/Treat Not Completed: Medical issues which prohibited therapy. Pt declined to participate with PT on today. He reported having vomited x 2. He requested PT check back another day.    Weston Anna, PT Acute Rehabilitation Services Pager: [phone] Office: [phone]

## 2018-03-24 NOTE — Progress Notes (Signed)
Report called to [facility] [employer]

## 2018-03-24 NOTE — Care Management Important Message (Signed)
Important Message  Patient Details  Name: Steven Ortega MRN: [id number] Date of Birth: 1956-01-04   Medicare Important Message Given:  Yes    Kerin Salen 03/24/2018, 2:01 [place] Message  Patient Details  Name: Steven Ortega MRN: [id number] Date of Birth: 1955/03/23   Medicare Important Message Given:  Yes    Kerin Salen 03/24/2018, 2:01 PM

## 2018-03-24 NOTE — Progress Notes (Signed)
Mr. dorris is about the same.  His wife thinks that he is sleeping too much.  We will adjust his pain medicine little bit more.  He is having a little more difficulty in eating.  He may have had some emesis yesterday.  He is still being treated for the E. coli bacteremia.  He now is on Rocephin which is a great idea since this is once a day.  His bilirubin has come down to 11.4.  His alkaline phosphatase keeps coming down it is now 183.  SGPT is 66 SGOT is 45.  Hopefully, this is a good sign that the biliary obstruction is starting to resolve a little bit.  His white cell count is 6.3.  Hemoglobin 10.5.  Platelet count 247,000.  He got out of bed a little bit this weekend.  I think he may have walked a little bit.  I told them that I just did not think that [facility] or any academic center would take him right now because of this bacteremia.  I do not think that he is a surgical candidate by any means because of the bacteremia.  I think if going to try to help with his malignancy now, we may have to think about radiation therapy.  I can probably use either 5-FU or gemcitabine as a radiation sensitizer.  He is not having any diarrhea.  The really is no change in his physical exam.  I think that we just need to keep following him along.  We have to keep trying to push oral intake.  His wife got liquid protein for him so hopefully this will help.  I know that everybody on [address] is doing a great job trying to get him better.  I appreciate all the efforts.   Lattie Haw, MD  2 Timothy 4:7

## 2018-03-24 NOTE — Plan of Care (Signed)
  Problem: Clinical Measurements: Goal: Respiratory complications will improve Outcome: Progressing   Problem: Coping: Goal: Level of anxiety will decrease Outcome: Progressing   Problem: Elimination: Goal: Will not experience complications related to bowel motility Outcome: Progressing Goal: Will not experience complications related to urinary retention Outcome: Progressing   Problem: Pain Managment: Goal: General experience of comfort will improve Outcome: Progressing   Problem: Skin Integrity: Goal: Risk for impaired skin integrity will decrease Outcome: Progressing

## 2018-03-25 LAB — TYPE AND SCREEN
ABO/RH(D): A POS
Antibody Screen: NEGATIVE
Unit division: 0
Unit division: 0
Unit division: 0

## 2018-03-25 LAB — BPAM RBC
BLOOD PRODUCT EXPIRATION DATE: 202002102359
BLOOD PRODUCT EXPIRATION DATE: 202002102359
Blood Product Expiration Date: 202002102359
ISSUE DATE / TIME: 202001171416
ISSUE DATE / TIME: 202001171728
UNIT TYPE AND RH: 6200
Unit Type and Rh: 6200
Unit Type and Rh: 6200

## 2018-03-26 MED ORDER — GENERIC EXTERNAL MEDICATION
10.00 | Status: DC
Start: 2018-03-27 — End: 2018-03-26

## 2018-03-26 MED ORDER — BUPROPION HCL ER (SR) 150 MG PO TB12
150.00 | ORAL_TABLET | ORAL | Status: DC
Start: 2018-03-26 — End: 2018-03-26

## 2018-03-26 MED ORDER — GENERIC EXTERNAL MEDICATION
Status: DC
Start: ? — End: 2018-03-26

## 2018-03-26 MED ORDER — KCL IN DEXTROSE-NACL 20-5-0.45 MEQ/L-%-% IV SOLN
100.00 | INTRAVENOUS | Status: DC
Start: ? — End: 2018-03-26

## 2018-03-26 MED ORDER — HEPARIN SODIUM (PORCINE) 5000 UNIT/ML IJ SOLN
5000.00 | INTRAMUSCULAR | Status: DC
Start: 2018-04-01 — End: 2018-03-26

## 2018-03-26 MED ORDER — ONDANSETRON HCL 4 MG/2ML IJ SOLN
4.00 | INTRAMUSCULAR | Status: DC
Start: ? — End: 2018-03-26

## 2018-03-26 MED ORDER — LORAZEPAM 2 MG/ML IJ SOLN
1.00 | INTRAMUSCULAR | Status: DC
Start: ? — End: 2018-03-26

## 2018-03-26 MED ORDER — LEVOTHYROXINE SODIUM 100 MCG IV SOLR
12.50 | INTRAVENOUS | Status: DC
Start: 2018-03-27 — End: 2018-03-26

## 2018-03-26 MED ORDER — PANTOPRAZOLE SODIUM 40 MG IV SOLR
40.00 | INTRAVENOUS | Status: DC
Start: 2018-04-02 — End: 2018-03-26

## 2018-03-26 MED ORDER — CITALOPRAM HYDROBROMIDE 40 MG PO TABS
40.00 | ORAL_TABLET | ORAL | Status: DC
Start: 2018-03-27 — End: 2018-03-26

## 2018-03-26 MED ORDER — GENERIC EXTERNAL MEDICATION
12.50 | Status: DC
Start: ? — End: 2018-03-26

## 2018-03-26 MED ORDER — NALOXONE HCL 0.4 MG/ML IJ SOLN
.40 | INTRAMUSCULAR | Status: DC
Start: ? — End: 2018-03-26

## 2018-03-26 MED ORDER — PIPERACILLIN-TAZOBACTAM 3.375 G IVPB
3.38 | INTRAVENOUS | Status: DC
Start: 2018-04-01 — End: 2018-03-26

## 2018-03-26 MED ORDER — TAMSULOSIN HCL 0.4 MG PO CAPS
0.40 | ORAL_CAPSULE | ORAL | Status: DC
Start: 2018-04-02 — End: 2018-03-26

## 2018-03-27 MED FILL — Fentanyl Citrate Preservative Free (PF) Inj 100 MCG/2ML: INTRAMUSCULAR | Qty: 2 | Status: AC

## 2018-04-01 ENCOUNTER — Encounter: Payer: Self-pay | Admitting: *Deleted

## 2018-04-01 MED ORDER — CARBOXYMETHYLCELLULOSE SODIUM 0.25 % OP SOLN
2.00 | OPHTHALMIC | Status: DC
Start: ? — End: 2018-04-01

## 2018-04-01 MED ORDER — MELATONIN 3 MG PO TABS
3.00 | ORAL_TABLET | ORAL | Status: DC
Start: 2018-04-01 — End: 2018-04-01

## 2018-04-01 MED ORDER — BUPROPION HCL ER (SR) 150 MG PO TB12
150.00 | ORAL_TABLET | ORAL | Status: DC
Start: 2018-04-02 — End: 2018-04-01

## 2018-04-01 MED ORDER — SODIUM CHLORIDE FLUSH 0.9 % IV SOLN
5.00 | INTRAVENOUS | Status: DC
Start: 2018-04-01 — End: 2018-04-01

## 2018-04-01 MED ORDER — NYSTATIN 100000 UNIT/ML MT SUSP
500000.00 | OROMUCOSAL | Status: DC
Start: 2018-04-01 — End: 2018-04-01

## 2018-04-01 MED ORDER — GENERIC EXTERNAL MEDICATION
Status: DC
Start: ? — End: 2018-04-01

## 2018-04-01 MED ORDER — CITALOPRAM HYDROBROMIDE 20 MG PO TABS
20.00 | ORAL_TABLET | ORAL | Status: DC
Start: 2018-04-02 — End: 2018-04-01

## 2018-04-01 MED ORDER — MORPHINE SULFATE 4 MG/ML IJ SOLN
2.00 | INTRAMUSCULAR | Status: DC
Start: ? — End: 2018-04-01

## 2018-04-01 MED ORDER — THIAMINE HCL 100 MG/ML IJ SOLN
200.00 | INTRAMUSCULAR | Status: DC
Start: 2018-04-02 — End: 2018-04-01

## 2018-04-01 MED ORDER — LEVOTHYROXINE SODIUM 25 MCG PO TABS
25.00 | ORAL_TABLET | ORAL | Status: DC
Start: 2018-04-02 — End: 2018-04-01

## 2018-04-01 MED ORDER — LORAZEPAM 2 MG/ML IJ SOLN
.50 | INTRAMUSCULAR | Status: DC
Start: ? — End: 2018-04-01

## 2018-04-01 MED ORDER — MORPHINE SULFATE 4 MG/ML IJ SOLN
1.00 | INTRAMUSCULAR | Status: DC
Start: ? — End: 2018-04-01

## 2018-04-03 ENCOUNTER — Ambulatory Visit: Payer: Medicare Other | Admitting: Hematology & Oncology

## 2018-04-03 ENCOUNTER — Ambulatory Visit: Payer: Medicare Other

## 2018-04-03 ENCOUNTER — Other Ambulatory Visit: Payer: Medicare Other

## 2018-04-16 ENCOUNTER — Encounter: Payer: Self-pay | Admitting: *Deleted

## 2018-04-17 ENCOUNTER — Inpatient Hospital Stay: Payer: Medicare Other

## 2018-04-17 ENCOUNTER — Inpatient Hospital Stay: Payer: Medicare Other | Admitting: Hematology & Oncology

## 2018-04-18 ENCOUNTER — Inpatient Hospital Stay: Payer: Medicare Other

## 2018-05-04 DEATH — deceased

## 2018-05-05 ENCOUNTER — Encounter: Payer: Self-pay | Admitting: *Deleted

## 2018-05-05 NOTE — Progress Notes (Signed)
Spoke with wife and received confirmation that patient has passed away.   Emotional support given. Dr Marin Olp notified.

## 2018-05-29 ENCOUNTER — Encounter: Payer: Self-pay | Admitting: Hematology & Oncology

## 2018-06-25 ENCOUNTER — Encounter: Payer: Self-pay | Admitting: *Deleted

## 2018-06-25 NOTE — Progress Notes (Signed)
Patient's daughter, Daivd Council, wanting to know if her father's cancer could have been caused by his long term Zantac use. He had been prescribed Zantac from [phone].  Spoke to Dr Marin Olp. He states this might be the case, however at this time there is no scientific evidence that show direct connection between zantac and cancer. At this time there is only epidemiology studies. He is not able to provide any documentation stating a definite connection.   Gave this information to Angie.

## 2019-08-25 IMAGING — XA IR CHOLANGIOGRAM VIA EXIST CATHETER
5 of 6 series · 12 of 18 positions shown · non-contrast
Comparison: none

INDICATION: Biliary obstruction, status post internal/external biliary drain
catheter placement 04/03/2018. Persistent elevation of bilirubin.
Assess drain function.

[Series 3: fl - angio · 3 of 123 frames shown (1 of 3)]
[frame 16/123]
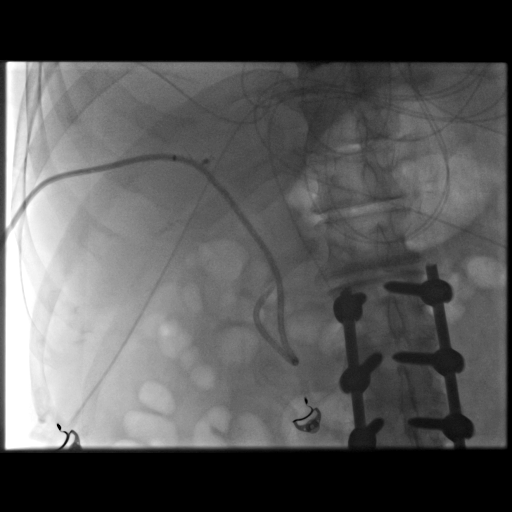
[frame 62/123]
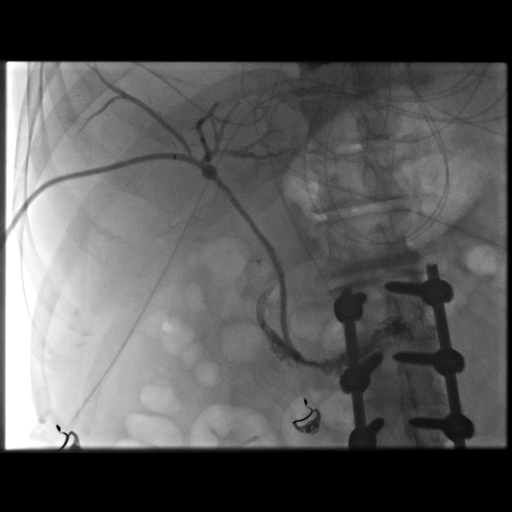
[frame 105/123]
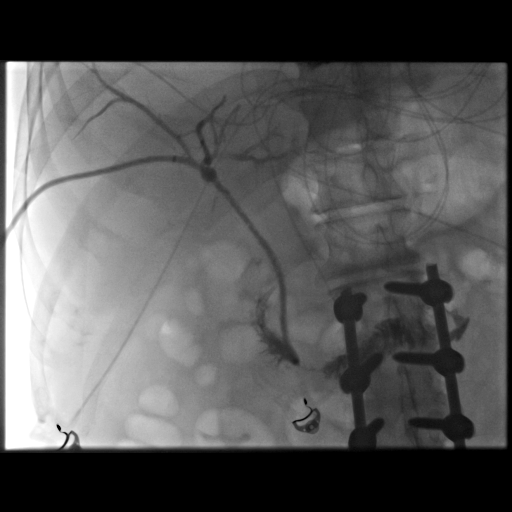

[Series 6: fl - angio · 3 of 65 frames shown (2 of 3)]
[frame 10/65]
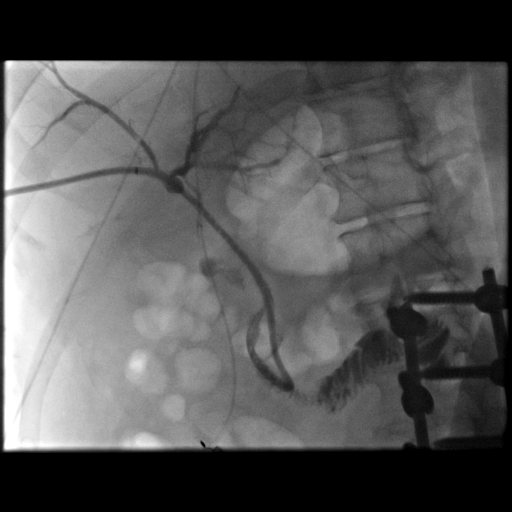
[frame 18/65]
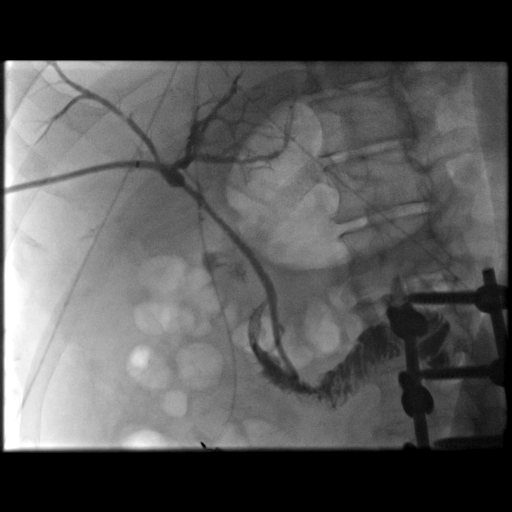
[frame 56/65]
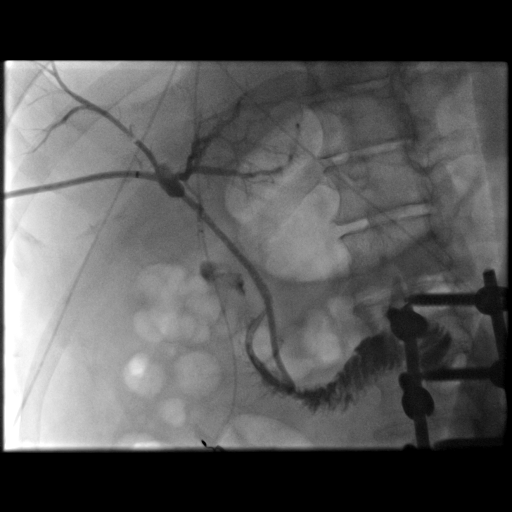

[Series 7: care single · 1 of 1 slices shown]
[im 1/1]
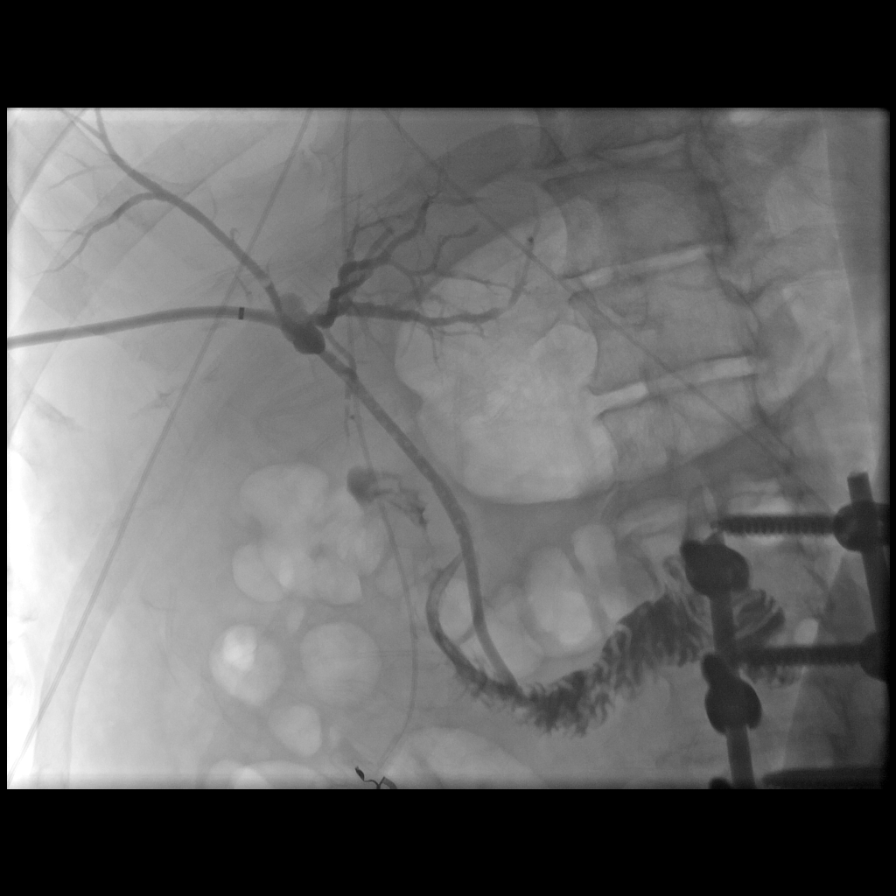

[Series 8: fl - angio · 2 of 91 frames shown (3 of 3)]
[frame 14/91]
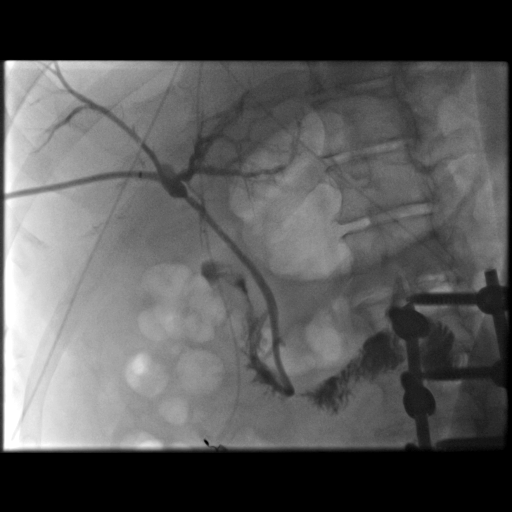
[frame 46/91]
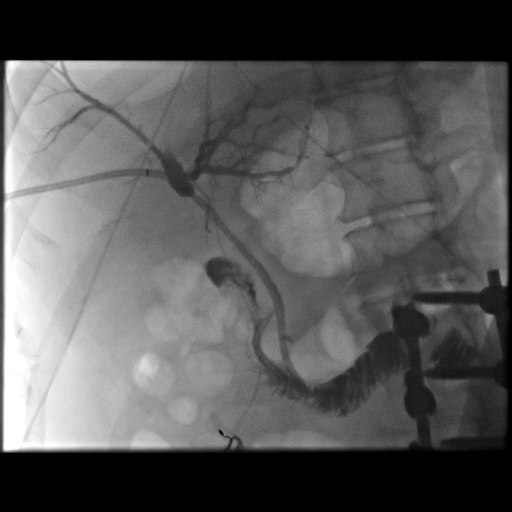

[Series 300: tube placements · 3 of 4 slices shown]
[im 1/4]
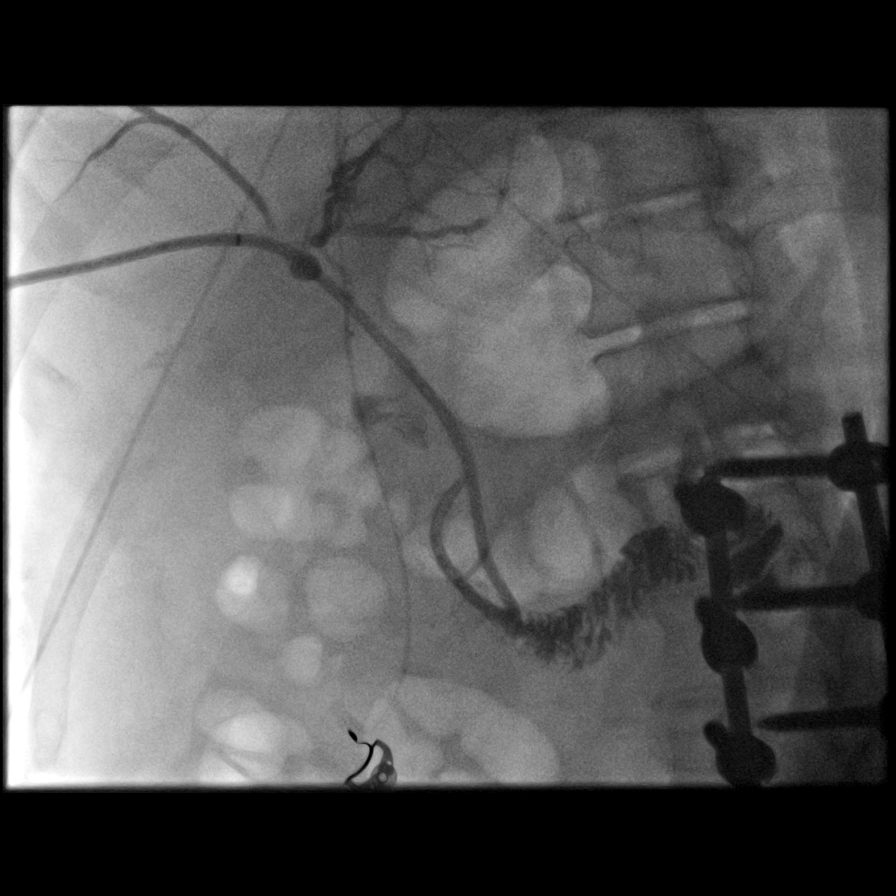
[im 2/4]
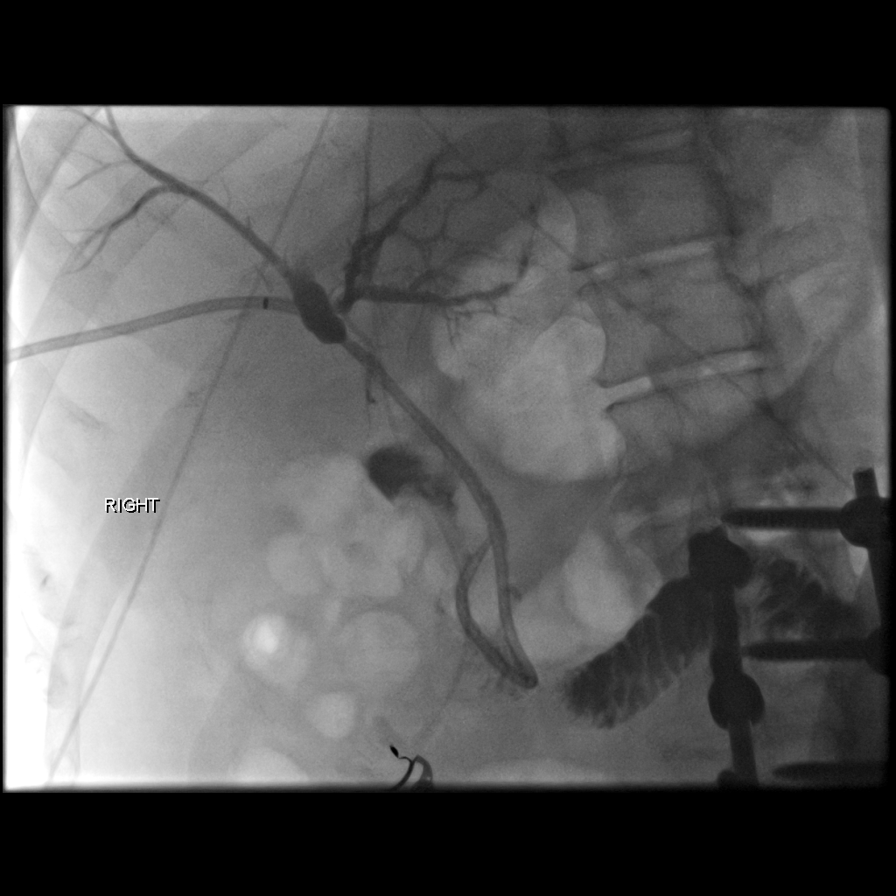
[im 4/4]
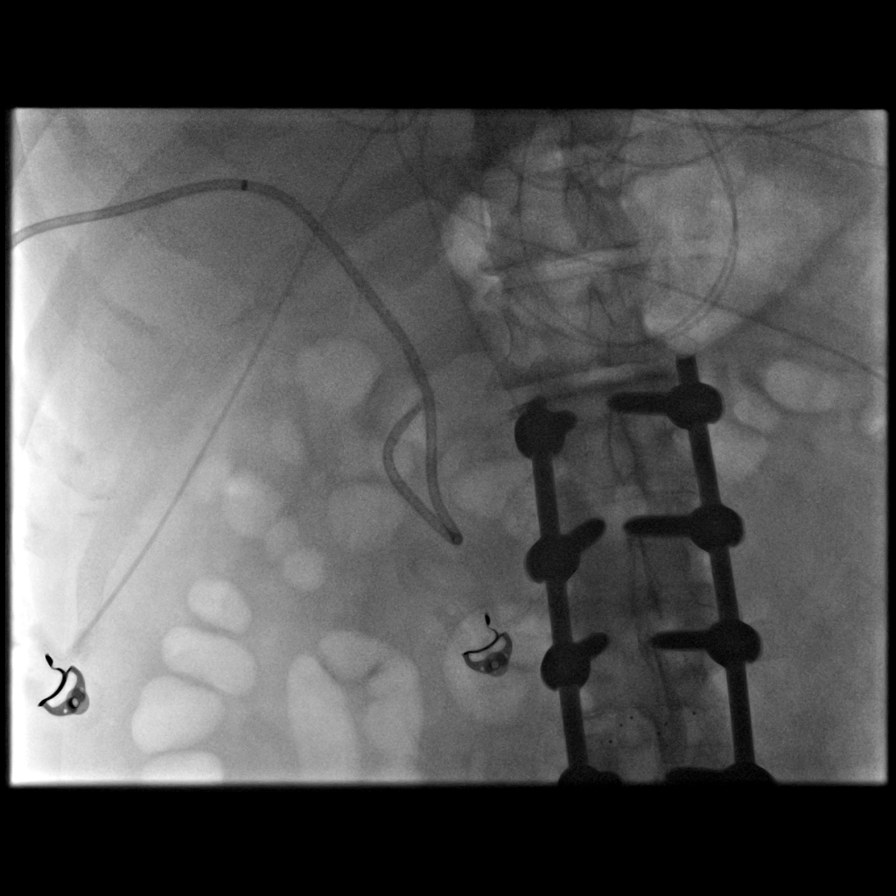

[12 of 18 positions shown; findings below may reference images not displayed]

EXAM:
CHOLANGIOGRAM THROUGH EXISTING CATHETER

MEDICATIONS:
NONE INDICATED

ANESTHESIA/SEDATION:
None required

FLUOROSCOPY TIME:  0.8 minute; 547  uKym1 DAP

COMPLICATIONS:
None immediate.

PROCEDURE:
Informed written consent was obtained from the patient after a
thorough discussion of the procedural risks, benefits and
alternatives. All questions were addressed. A timeout was performed
prior to the initiation of the procedure.

Scout radiographic image obtained. This demonstrated stable position
of the internal/external biliary drain catheter compared to
placement images from 03/17/2018.

Contrast was injected through the catheter. This opacified left
biliary tree and portion of the right biliary tree. The inferior
right intrahepatic biliary tree was not opacified, despite changes
in patient positioning.

Catheter is patent into the duodenum. The duodenum is decompressed
and unremarkable.

The catheter was flushed and returned to external drainage.
IMPRESSION: 1. Internal/external biliary drain catheter remains in stable
position, patent, with decompression of visualized intrahepatic
biliary ducts.
2. Nonvisualization of portions of the right biliary tree suggesting
possibility of central intrahepatic segmental biliary obstruction.
MRCP may be useful for further evaluation.
Findings were reviewed with Dr. Tuni.
